# Patient Record
Sex: Male | Born: 1960 | Race: White | Hispanic: No | Marital: Married | Smoking: Former smoker
Health system: Southern US, Community
[De-identification: ages and names within clinical notes are randomized; demographics above are authoritative.]

## PROBLEM LIST (undated history)

## (undated) DIAGNOSIS — Z789 Other specified health status: Secondary | ICD-10-CM

## (undated) DIAGNOSIS — E785 Hyperlipidemia, unspecified: Secondary | ICD-10-CM

## (undated) DIAGNOSIS — I2510 Atherosclerotic heart disease of native coronary artery without angina pectoris: Secondary | ICD-10-CM

## (undated) DIAGNOSIS — G4730 Sleep apnea, unspecified: Secondary | ICD-10-CM

## (undated) DIAGNOSIS — M1990 Unspecified osteoarthritis, unspecified site: Secondary | ICD-10-CM

## (undated) DIAGNOSIS — G459 Transient cerebral ischemic attack, unspecified: Secondary | ICD-10-CM

## (undated) DIAGNOSIS — R079 Chest pain, unspecified: Secondary | ICD-10-CM

## (undated) HISTORY — DX: Atherosclerotic heart disease of native coronary artery without angina pectoris: I25.10

## (undated) HISTORY — PX: NASAL SINUS SURGERY: SHX719

## (undated) HISTORY — PX: PTCA: SHX146

## (undated) HISTORY — DX: Sleep apnea, unspecified: G47.30

## (undated) HISTORY — DX: Chest pain, unspecified: R07.9

## (undated) HISTORY — DX: Other specified health status: Z78.9

## (undated) HISTORY — DX: Transient cerebral ischemic attack, unspecified: G45.9

## (undated) HISTORY — DX: Hyperlipidemia, unspecified: E78.5

---

## 2000-12-09 ENCOUNTER — Encounter: Admission: RE | Admit: 2000-12-09 | Discharge: 2000-12-09 | Payer: Self-pay | Admitting: Orthopedic Surgery

## 2000-12-09 ENCOUNTER — Encounter: Payer: Self-pay | Admitting: Orthopedic Surgery

## 2002-04-19 ENCOUNTER — Emergency Department (HOSPITAL_COMMUNITY): Admission: EM | Admit: 2002-04-19 | Discharge: 2002-04-19 | Payer: Self-pay

## 2002-04-21 ENCOUNTER — Encounter: Payer: Self-pay | Admitting: Emergency Medicine

## 2002-04-21 ENCOUNTER — Inpatient Hospital Stay (HOSPITAL_COMMUNITY): Admission: EM | Admit: 2002-04-21 | Discharge: 2002-04-23 | Payer: Self-pay | Admitting: Emergency Medicine

## 2002-04-27 ENCOUNTER — Ambulatory Visit (HOSPITAL_COMMUNITY): Admission: RE | Admit: 2002-04-27 | Discharge: 2002-04-28 | Payer: Self-pay | Admitting: Cardiovascular Disease

## 2002-12-14 ENCOUNTER — Ambulatory Visit (HOSPITAL_COMMUNITY): Admission: RE | Admit: 2002-12-14 | Discharge: 2002-12-15 | Payer: Self-pay | Admitting: Cardiovascular Disease

## 2007-01-12 ENCOUNTER — Ambulatory Visit (HOSPITAL_COMMUNITY): Admission: RE | Admit: 2007-01-12 | Discharge: 2007-01-12 | Payer: Self-pay | Admitting: *Deleted

## 2008-08-31 ENCOUNTER — Emergency Department: Payer: Self-pay | Admitting: Emergency Medicine

## 2009-06-09 ENCOUNTER — Encounter: Payer: Self-pay | Admitting: Cardiovascular Disease

## 2010-05-13 ENCOUNTER — Encounter: Payer: Self-pay | Admitting: *Deleted

## 2010-09-07 NOTE — H&P (Signed)
NAMEMEHMET, SCALLY                          ACCOUNT NO.:  [id number]   MEDICAL RECORD NO.:  [id number]                   PATIENT TYPE:  INP   LOCATION:  1827                                 FACILITY:  [facility]   PHYSICIAN:  Mark A. Perini, M.D.                DATE OF BIRTH:  22-Sep-1960   DATE OF ADMISSION:  04/21/2002  DATE OF DISCHARGE:                                HISTORY & PHYSICAL   CHIEF COMPLAINT:  Chest and neck pain.   HISTORY OF PRESENT ILLNESS:  The patient is a pleasant 50 year-old male with  no previous cardiac history but who does have a past history of significant  tobacco use and a family history of early coronary artery disease in his  mother.  He presents with a one week history of on and off pain at rest  which develops in his upper chest and extends into his bilateral neck.  These episodes initially started as just a brief shock like feeling of pain  that would just last a few seconds, but over the last few days the length,  severity and frequency of these episodes has increased.  Now, the pain  episodes last a few minutes at least.  He was seen in the emergency room on  April 19, 2002 for similar symptoms and had a normal EKG and enzymes and  was released.  However, the pains have continued.  This morning he was  awakened at 3:00 in the morning and had significant pain for several  minutes.  Again, this morning he had an episode of pain which extended into  his neck and down both arms and he also had associated diaphoresis at that  time.  In the emergency room the EKG is normal as is the chest x-ray.  Initial cardiac enzymes are normal, but the patient will require admission  for further evaluation.   PAST MEDICAL HISTORY:  1. Significant gastroesophageal reflux for the last two years.  He has been     on Nexium for the last six to eight months which did help with dyspepsia     that he was having earlier.  2. Borderline elevated blood pressure in the  office on the last visit, but     no definite history of hypertension.  3. He has an unknown cholesterol history.  4. History of nose surgery and eye surgery.  5. No  known cardiovascular history and no history of diabetes.   ALLERGIES:  No known allergies.   MEDICATIONS:  Nexium 40 mg once daily.   SOCIAL HISTORY:  He has been married for five years.  He has three children,  one from his current marriage and two step-children.  He quit smoking  tobacco in February of 2003.  He still dips [place].  He did smoke two to  three packs a day for 20 years.  He drinks four to five  alcoholic drinks per  day on average. No illicit drug use history.  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   FAMILY HISTORY:  Mother is alive at age 20.  She was diagnosed with heart  disease in her mid fifties.  Father is alive at age 26 and has no history of  vascular disease.  He has four sisters, one with a thyroid disorder, but no  known cardiovascular disease in his sisters.   REVIEW OF SYMPTOMS:  As per the history of present illness.  The patient  denies any fevers, chills, diarrhea or constipation. He denies any blood in  his stool or melena.  He denies any hematemesis or hematochezia or  hemoptysis. He has had no cough or genitourinary symptoms.  He states that  he has had eight or nine episodes of the pain in the last twelve hours.  He  has had no pain since coming to the emergency room.   PHYSICAL EXAMINATION:  VITAL SIGNS:  Temperature is 97.0, blood pressure  152/80, pulse 68, respiratory rate 18, 98% saturation on room air.  GENERAL:  He is in no acute distress, sitting on a stretcher.  HEENT:  Normocephalic, atraumatic.  Pupils equal, round and reactive to  light.  Extraocular movements are intact. There is no jugular venous  distention.  No carotid or other bruits are noted.  LUNGS:  Clear to auscultation bilaterally.  HEART:  Regular rate and rhythm with no murmurs,  rubs or gallops.  ABDOMEN: Benign.  EXTREMITIES:  There is no cyanosis, clubbing or edema.  There are 2+ distal  and femoral pulses bilaterally.   LABORATORY DATA:  Sodium 139, potassium 4.1, chloride 106, C02 25, BUN 11,  creatinine 1.0, glucose 105.  LFTs are normal.  Lipase is normal at 23.  The  pH is 7.44, pC02 is 35 and bicarbonate is 24.  D-dimer is negative at less  than 0.22.  Troponin I is 0.01, CK is 93 with an MB of 0.9.  CBC today was  hemolyzed but a CBC from April 19, 2002 is normal.   EKG reveals normal sinus rhythm with a heart rate of 67 beats per minute and  no acute ST or T wave changes.   Chest x-ray shows no acute disease.   ASSESSMENT AND PLAN:  50 year-old male with rest chest pain in the  setting of a family history of coronary artery disease and past heavy  tobacco use.  We will admit him to a telemetry bed.  We will rule out for  myocardial infarction with serial enzymes and electrocardiograms.  We will  increase his protein pump inhibitor to twice daily. We will give him an  aspirin dose and treat him with oxygen and IV access.  We will give him low  dose nitroglycerin paste for the first 12 hours of his admission. We will  call cardiology for a consultation to assist in his workup.  Some features  of his pain are atypical for unstable angina, but coronary artery disease  needs to be considered as a possible cause of his pain. He does have a  significant reflux history and this could be a reflux variant or  musculoskeletal type pain.  Mark A. Waynard Edwards, M.D.    MAP/MEDQ  D:  04/21/2002  T:  04/21/2002  Job:  [id number]   cc:   Barry Dienes. Eloise Harman, M.D.  [address]  [place]  [place] [address]  Fax: [phone]

## 2010-09-07 NOTE — Consult Note (Signed)
Mario [name], Mario [name]                          ACCOUNT NO.:  [id number]   MEDICAL RECORD NO.:  [id number]                   PATIENT TYPE:  INP   LOCATION:  2009                                 FACILITY:  [facility]   PHYSICIAN:  Vesta Mixer, M.D.              DATE OF BIRTH:  [name] 08, 1961   DATE OF CONSULTATION:  04/21/2002  DATE OF DISCHARGE:                                   CONSULTATION   HISTORY OF PRESENT ILLNESS:  The patient is a 50 year old gentleman with a  history of gastroesophageal reflux.  He was admitted with chest pain and  diaphoresis and arm pain.   The patient reports feeling fatigued for the past month or so.  His wife has  also noticed that he really has a lack of energy for the past several  months.  For the past week or so he has developed episodes of chest pain and  pressure.  The pain is described as a cold or burning-like sensation.  Last  week it was associated with exertion.  This week it was not associated with  any particular activity or moving around.  He states that these episodes of  pain last for several minutes and the pain radiates up to his neck and down  his arms.  The pain was quite severe and woke him up this morning.  He  presented to the emergency room for further evaluation.   CURRENT MEDICATION:  Nexium once a day.   ALLERGIES:  No known drug allergies.   PAST MEDICAL HISTORY:  Gastroesophageal reflux disease.   SOCIAL HISTORY:  The patient quit smoking this year.  He drinks several  beers a day.   FAMILY HISTORY:  This is positive for coronary artery disease.   REVIEW OF SYSTEMS:  This is positive for fatigue.  Otherwise his review of  systems is negative.   PHYSICAL EXAMINATION:  GENERAL APPEARANCE:  He is a young gentleman in no  acute distress.  He is currently pain-free.  VITAL SIGNS:  His heart rate is 60, blood pressure is 112/59.  HEENT:  There are 2+ carotids, no JVD, no bruits, no thyromegaly.  LUNGS:  Clear to  auscultation.  CARDIOVASCULAR:  Regular rate, S1 and S2 with no murmurs, rubs, or gallops.  ABDOMEN:  Good bowel sounds.  No hepatosplenomegaly.  EXTREMITIES:  There is no clubbing, cyanosis, or edema.  NEUROLOGIC:  Examination is nonfocal.   His EKG reveals normal sinus rhythm.  He has no ST or T-wave abnormalities.   LABORATORY DATA:  Normal CBC and normal chem-7.  His CPK and troponin  levels are negative.  D-dimer level is negative.   IMPRESSION:  The patient presents with symptoms that are fairly classic for  unstable angina.  He initially had exertional angina and now has angina at  rest.  These episodes of chest pain last for several minutes and are  associated  with a pressure-like sensation with radiation down his arms and  up to his jaw.  Given these unstable  symptoms, I do not think that a Cardiolite would be advisable.  I have  recommended that we proceed with a heart catheterization next Friday.  If he  develops positive cardiac enzymes, then we will start him on heparin.  His  other medical problems remain stable.  We will check a fasting lipid profile  tomorrow morning.                                                Vesta Mixer, M.D.    PJN/MEDQ  D:  04/21/2002  T:  04/22/2002  Job:  [id number]

## 2010-09-07 NOTE — H&P (Signed)
NAMEMURPHY, DUZAN NO.:  [id number]   MEDICAL RECORD NO.:  [id number]                   PATIENT TYPE:   LOCATION:                                       FACILITY:   PHYSICIAN:  Vesta Mixer, M.D.              DATE OF BIRTH:  05-23-1960   DATE OF ADMISSION:  04/27/2002  DATE OF DISCHARGE:                                HISTORY & PHYSICAL   HISTORY OF PRESENT ILLNESS:  The patient is a 50 year old gentleman with a  history of gastroesophageal reflux disease, hyperlipidemia and chest pain.  He was admitted last week for episodes of chest pain.  He ruled out for  myocardial infarction but had a heart catheterization which revealed a  moderately tight left circumflex lesion.  He was sent home to be placed on  medical therapy but now returns for heart catheterization because of  persistent episodes of chest pain.   The patient has had persistent episodes of chest pain.  These episodes of  chest pain occur primarily with rest.  They typically occur in the night or  early in the morning.  They are described as a chest pressure which radiates  up into his neck and down into his arms.  It is associated with some  burning.  It does not feel anything at all like his episodes of  gastroesophageal reflux and he is referred for heart catheterization for  further evaluation.   CURRENT MEDICATIONS:  1. Enteric-coated aspirin once a day.  2. Plavix 75 mg a day.  3. Imdur 30 mg a day.   ALLERGIES:  He has no known drug allergies.   PAST MEDICAL HISTORY:  1. Gastroesophageal reflux.  2. Coronary artery disease.  3. Hyperlipidemia.   SOCIAL HISTORY:  The patient used to smoke but quit approximately one year  ago.  He does not drink a significant amount of alcohol.   FAMILY HISTORY:  His family history is pertinent for coronary artery  disease.  His mother does have a history of syncope with near cardiogenic  syncope.   REVIEW OF SYSTEMS:  His review  of systems is negative.   PHYSICAL EXAMINATION:  GENERAL:  On exam, he is a middle-aged gentleman in  no acute distress.  He is alert and oriented x3 and his mood and affect are  normal.  VITAL SIGNS:  His weight is 216 and his blood pressure is 124/80 with a  heart rate of 76.  HEENT/NECK:  Exam reveals 2+ carotids.  He has no bruits.  There is no JVD  and no thyromegaly.  LUNGS:  Lungs are clear to auscultation.  HEART:  Regular rate, S1 and S2, with no murmurs, gallops or rubs.  ABDOMEN:  Exam reveals good bowel sounds and is nontender.  EXTREMITIES:  He has no cyanosis, clubbing or edema.  NEUROLOGIC:  Exam is nonfocal.   LABORATORY AND ACCESSORY  CLINICAL DATA:  His EKG reveals normal sinus  rhythm.  He has no ST or T wave changes.   IMPRESSION:  The patient presents with persistent episodes of chest pain  consistent with unstable angina.  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  I had hoped that we could treat  him medically but he continues to have episode of pain.  Given his rather  unstable symptoms, I do not think that a Cardiolite is advisable.  We will  schedule him for a heart catheterization.  We have discussed the risks,  benefits and options of heart catheterization and angioplasty; he  understands and agrees to proceed.                                               Vesta Mixer, M.D.    PJN/MEDQ  D:  04/27/2002  T:  04/27/2002  Job:  [id number]   cc:   Barry Dienes. Eloise Harman, M.D.  [address]  [place]  [place] [address]  Fax: [phone]

## 2010-09-07 NOTE — Discharge Summary (Signed)
NAMEWILKIE, ZENON                          ACCOUNT NO.:  [id number]   MEDICAL RECORD NO.:  [id number]                   PATIENT TYPE:  INP   LOCATION:  2009                                 FACILITY:  [facility]   PHYSICIAN:  Vesta Mixer, M.D.              DATE OF BIRTH:  1960-07-17   DATE OF ADMISSION:  04/21/2002  DATE OF DISCHARGE:  04/23/2002                                 DISCHARGE SUMMARY   DISCHARGE DIAGNOSES:  1. Chest pain - probably noncardiac.  2. Mild to moderate coronary artery irregularities.  3. Hyperlipidemia.  4. History of gastroesophageal reflux disease.   DISCHARGE MEDICATIONS:  1. Enteric-coated aspirin 325 mg a day.  2. Plavix 75 mg a day.  3. Toprol XL 25 mg a day.  4. IMDUR 60 mg a day.  5. Nitroglycerin 0.4 mg sublingually as needed.  6. Crestor 10 mg at night.  7. Prevacid 30 mg p.o. b.i.d.   DISPOSITION:  The patient is to see Dr. Elease Hashimoto next week for a Cardiolite  study.  He is to call for an appointment.   HISTORY OF PRESENT ILLNESS:  The patient is a 50 year old gentleman who was  admitted to the hospital on December 31, with episodes of chest pain.  His  chest pain was somewhat typical for angina.   HOSPITAL COURSE:  Problem 1.  Chest pain. The patient ruled out for  myocardial infarction with serial CPKs and EKGs.  On April 23, 2002, he had  a cardiac catheterization which revealed a moderate stenosis in his  circumflex marginal stenosis.  This stenosis appeared to be approximately 30  to 40% in severity in some views and as much as 50 to 60% in some other  angles.  It did not appear to obstruct flow. It also did not appear to be  severe enough to be causing rest angina.   The patient was started on medications and tolerated them fairly well.  He  tolerated his nitroglycerin patch. He will be discharged on the above-noted  medications. We will schedule a Cardiolite study as an outpatient.  I have  asked him to call sooner if he has  any worsening problems.   Problem 2.  Hypercholesterolemia.  The patient's fasting lipid profile  revealed an LDL of 128.  He was started on Crestor 10 mg a day.   Problem 3.  Gastroesophageal reflux disease.  The patient had been on  Nexium. We will place him on Prevacid 30 mg twice a day which should help  with some of his symptoms.  He is to call back for further problems.                                               Vesta Mixer, M.D.  PJN/MEDQ  D:  04/23/2002  T:  04/24/2002  Job:  [id number]   cc:   Barry Dienes. Eloise Harman, M.D.  [address]  [place]  [place] [address]  Fax: [phone]

## 2010-09-07 NOTE — Cardiovascular Report (Signed)
NAMEICARUS, PARTCH                          ACCOUNT NO.:  [id number]   MEDICAL RECORD NO.:  [id number]                   PATIENT TYPE:  OIB   LOCATION:  6532                                 FACILITY:  [facility]   PHYSICIAN:  Vesta Mixer, M.D.              DATE OF BIRTH:  [birth year]   DATE OF PROCEDURE:  12/14/2002  DATE OF DISCHARGE:                              CARDIAC CATHETERIZATION   PROCEDURE:  Left heart catheterization with coronary angiography with PTCA  and stenting of the left circumflex marginal vessel.   INDICATIONS:  Mr. Milby is a 50 year old gentleman with a history of  coronary artery disease.  He has had PTCA and stenting to his left  circumflex marginal vessel back in January of 2004.  He returned yesterday  with episodes of chest pain and shortness of breath, particularly with  exertion.  He is referred for a heart catheterization for further  evaluation.   PROCEDURE:  The right femoral artery was easily cannulated using a modified  Seldinger technique.   HEMODYNAMICS:  The LV pressure was 143/20 with an aortic pressure of 144/80.   ANGIOGRAPHY:  The left main is very short and is otherwise normal.   The left anterior descending artery is a moderate sized vessel.  There are  minor luminal irregularities in the LAD.  There are no discreet stenoses.  There are several diagonal branches which are normal.   The left circumflex artery is a large and dominant vessel.  There is a  stenosis at the bifurcation of the first obtuse marginal artery and the  distal circumflex artery.  This stenosis is approximately 75% in each limb.  The previously placed stent in the first obtuse marginal artery has moderate  diffuse in-stent restenosis.  There is TIMI grade 3 flow down both vessels.   The right coronary artery is small and nondominant.  There was catheter  dampening with engagement.   LEFT VENTRICULOGRAM:  The left ventriculogram was performed in a 30 RAO  position.  It reveals overall normal left ventricular systolic function.  The ejection fraction is approximately 60%.  There is some catheter induced  mitral regurgitation.   PTCA:  The left main was engaged using a 6-French Judkins left 4 guide.  The  patient was given heparin at a weight based amount followed by a double  bolus Integrilin drip.  The circumflex marginal vessel was wired using a  Patriot guidewire.  A 3.5 x 15 mm Quantum Maverick was positioned down  across the stenosis and was inflated up to 8 atmospheres for 44 seconds.  This resulted in a very nice angiographic view with significant improvement  of the lesion.  At this point a 3.5 x 16 mm Taxus stent was positioned  across the stenosis.  We positioned the stent to extend up to cover the  entrance of the distal circumflex.  It was deployed at 25  atmospheres for 34  seconds.  This resulted in a very nice angiographic result of the obtuse  marginal artery.  There was some compromise of the true circumflex.  The  stent balloon was pulled out and the 3.5 x 15 mm Quantum Maverick was placed  down into the distal aspect of the previously placed stent.  It was deflated  up to 18 atmospheres for 41 seconds and then pulled back and was inflated up  to 18 atmospheres for another 20 seconds.  This resulted in further  improvement of the origin stent and the placed stent.  The wire was then  pulled out of the obtuse marginal artery and was repositioned down the true  circumflex.  The 3.5 x 15 mm Quantum would not pass because of the wing of  the balloon.  At this point a 3.0 x 15 mm Maverick balloon was able to slide  down the true circumflex.  It was inflated up to 8 atmospheres for 32  seconds which resulted in significant improvement of the vessel opening.  There was perhaps a 10-20% residual stenosis.   COMPLICATIONS:  None.   CONCLUSION:  1. Successful PTCA and stenting of the first obtuse marginal artery and the     distal  circumflex artery.  2. Normal left ventricular systolic function.                                               Vesta Mixer, M.D.    PJN/MEDQ  D:  12/14/2002  T:  12/15/2002  Job:  [id number]

## 2010-09-07 NOTE — Cardiovascular Report (Signed)
NAMETYE, VIGO                          ACCOUNT NO.:  [id number]   MEDICAL RECORD NO.:  [id number]                   PATIENT TYPE:  OIB   LOCATION:  2875                                 FACILITY:  [facility]   PHYSICIAN:  Vesta Mixer, M.D.              DATE OF BIRTH:  04/03/1961   DATE OF PROCEDURE:  04/27/2002  DATE OF DISCHARGE:                              CARDIAC CATHETERIZATION   INDICATIONS:  The patient is a 50 year old gentleman who was admitted to the  hospital with chest pains last week.  He ruled out for myocardial  infarction.  He had a heart catheterization which revealed a moderate to  severe left circumflex marginal stenosis. It was felt that this stenosis  could be treated medically and he was discharged to home.  He continued to  have episodes of chest pain.  He was brought back today for intervention of  his obtuse marginal artery.   DESCRIPTION OF PROCEDURE:  The right femoral artery was easily cannulated  using the modified Seldinger technique.  An 8 French catheter was placed.  Initial angiography revealed a moderate left circumflex marginal stenosis.  We gave the patient 5000 units of heparin.  We performed a intravascular  ultrasound.  This revealed a very tight stenosis which was actually much  more severe than what we initially thought.  The stenosis was approximately  85-90% in severity.  In addition it revealed a very long segment of plaque  extending from the bifurcation of the left circumflex and marginal system  down approximately 22 mm.  The patient was given a double bolus Integrilin  drip.  Distally a 3.5 x 24 mm Express II stent was positioned right at the  bifurcation extending down the obtuse marginal artery.  It was deployed at  14 atmospheres for 60 seconds.  A followup IVUS revealed that there was some  slight under deployment of the stent, especially at the tight lesion.  In  addition, it revealed that the reference vessel was  approximately 3.5 to  3.75 mm in diameter and that the stent had only been expanded to 3.1 mm.  At  this point a 3.1 x 15 mm Quantum Maverick was positioned down into the  stent.  It was deployed distally at 12 atmospheres for 20 seconds and then  was deployed proximally at 14 atmospheres for 24 seconds.  This resulted in  a very nice angiographic result of the first obtuse marginal.  There was  some compromise of the true circumflex artery.  At this point the IVUS and  wire were removed and a second Patriot wire was placed down the circumflex  vessel.  A 3.0 x 15 mm Maverick balloon was positioned across the stenosis.  It was inflated up to 2 atmospheres for 17 seconds, 6 atmospheres for 50  seconds and then 12 atmospheres for 40 seconds.  This resulted in a very  nice angiographic result.  Followup IVUS of this lesion revealed minor  plaque irregularities.  The tightest stenosis was approximately 25%.  There  was no plaque disruption and no dissection.  The stent struts were wall  opposed and the stent ended right at the bifurcation of the circumflex  artery and obtuse marginal artery.   The patient did have some postprocedural pain, but there was brisk flow  throughout the case.  The patient will be treated with Plavix for  approximately nine months.                                                  Vesta Mixer, M.D.    PJN/MEDQ  D:  04/27/2002  T:  04/27/2002  Job:  [id number]

## 2010-09-07 NOTE — Discharge Summary (Signed)
   Mario [name], Mario [name]                          ACCOUNT NO.:  [id number]   MEDICAL RECORD NO.:  [id number]                   PATIENT TYPE:  OIB   LOCATION:  6532                                 FACILITY:  [facility]   PHYSICIAN:  Vesta Mixer, M.D.              DATE OF BIRTH:  August 15, 1960   DATE OF ADMISSION:  12/14/2002  DATE OF DISCHARGE:  12/15/2002                                 DISCHARGE SUMMARY   DISCHARGE DIAGNOSES:  1. Coronary artery disease.  2. Status post percutaneous transluminal coronary angioplasty and stenting     of first obtuse marginal artery.  3. Dyslipidemia - intolerant to most statins.   DISCHARGE MEDICATIONS:  1. Aspirin 325 mg a day.  2. Plavix 75 mg a day.  3. Nexium 40 mg a day.  4. Nitroglycerin 0.4 mg sublingually.  5. Pravachol 40 mg a day.  6. Zetia 10 mg a day.   DISPOSITION:  1. The patient will see Dr. Elease Hashimoto in 1-2 weeks.  2. He has been advised not to lift anything heavy for the next couple of     weeks.  3. We have asked him to increase his aerobic exercise and to limit his     weight-lifting activities.  4. We will consider starting him on Niaspan and/or Welchol in the near     future.  He has been intolerant to Zocor, Lipitor and Crestor.   HISTORY:  Mr. Rodda is a 50 year old gentleman with a history of coronary  artery disease, status post PTCA and stenting of his first obtuse marginal  artery back in January 2004.  He was admitted with episodes of chest pain.  Please see dictated H&P for further details.   HOSPITAL COURSE:  #1.  CHEST PAIN:  The patient had a heart catheterization,  which revealed a tight stenosis in his circumflex marginal vessel.  He  underwent successful percutaneous transluminal coronary angioplasty and  stenting of this vessel, using a 3.5 x 16-mm TAXUS stent.  The stent was  inflated up to 3.7 mm in diameter.  We  also then performed balloon angioplasty down the negative circumflex artery.  He tolerated the  procedure quite well.  He did not have any episodes of  chest pain or problems.   He will be discharged on the above-noted medications and dispositioned.                                                Vesta Mixer, M.D.    PJN/MEDQ  D:  12/15/2002  T:  12/16/2002  Job:  [id number]   cc:   Barry Dienes. Eloise Harman, M.D.  [address]  [place]  [place] [address]  Fax: [phone]

## 2010-09-07 NOTE — Cardiovascular Report (Signed)
NAMEANDRAY, Mario [name]                          ACCOUNT NO.:  [id number]   MEDICAL RECORD NO.:  [id number]                   PATIENT TYPE:  INP   LOCATION:  2009                                 FACILITY:  [facility]   PHYSICIAN:  Vesta Mixer, M.D.              DATE OF BIRTH:  08-03-1960   DATE OF PROCEDURE:  04/23/2002  DATE OF DISCHARGE:                              CARDIAC CATHETERIZATION   HISTORY:  The patient is a 50 year old gentleman who was admitted to the  hospital two days ago with symptoms consistent with unstable angina.  He was  referred for a heart catheterization because of the nature of his symptoms.   PROCEDURE:  Left heart catheterization with coronary angiography.   DESCRIPTION OF PROCEDURE:  The right femoral artery was easily cannulated  using a modified Seldinger technique.   HEMODYNAMICS:  The left ventricular pressure was 119/16 with an aortic  pressure of 121/96.   ANGIOGRAPHY:  1. His left main coronary artery was smooth and normal.  2. The left anterior descending artery is fairly normal.  There are several     large diagonal branches which are fairly normal.  3. The left circumflex artery is large and dominant.  It gives off a large     first obtuse marginal artery.  There is a 40%-50% to 50%-60% stenosis in     the proximal aspect of this first OM.  In some views, this lesions     appears to be in the 60%-70% range and in other views this lesions     appears to be only 30%-40% in severity.  The flow down this vessel is     quite brisk.  4. The distal circumflex artery and posterolateral branches and posterior     descending artery are normal.  5. The right coronary artery is relatively small and is nondominant.  There     are no significant irregularities.  6. The left ventriculogram was performed in a 30 RAO position.  It reveals     overall normal left ventricular systolic function.  There are no     segmental wall motion abnormalities.   COMPLICATIONS:  None.   CONCLUSION:  The patient has single-vessel coronary artery disease involving  his first obtuse marginal artery.  This stenosis is moderate in severity.  The flow through this lesion is fairly good.  This lesion appears to be  tight in  some views and in other views it appears to be not so tight.  I would like  to treat him medically and we will schedule a stress Cardiolite study as an  outpatient.  If he is found to have ischemia or if he has recurrent  symptoms, then we will proceed with angioplasty and stenting of this lesion.  Vesta Mixer, M.D.    PJN/MEDQ  D:  04/23/2002  T:  04/23/2002  Job:  [id number]   cc:   Barry Dienes. Eloise Harman, M.D.  [address]  [place]  [place] [address]  Fax: [phone]

## 2010-09-07 NOTE — Discharge Summary (Signed)
[name], [name]                          ACCOUNT NO.:  [id number]   MEDICAL RECORD NO.:  [id number]                   PATIENT TYPE:  OIB   LOCATION:  6533                                 FACILITY:  [facility]   PHYSICIAN:  Vesta Mixer, M.D.              DATE OF BIRTH:  1961/01/23   DATE OF ADMISSION:  04/27/2002  DATE OF DISCHARGE:  04/28/2002                                 DISCHARGE SUMMARY   DISCHARGE DIAGNOSES:  1. Coronary artery disease involving the left circumflex artery.  2. Status post percutaneous transluminal coronary angioplasty and stenting     of the first obtuse marginal artery, status post percutaneous     transluminal coronary angioplasty of the left circumflex artery.  3. Hyperlipidemia.  4. History of cigarette smoking.   DISCHARGE MEDICATIONS:  1. Enteric-coated aspirin 325 mg a day.  2. Plavix 75 mg a day.  3. Nitroglycerin 0.4 mg sublingually as needed.  4. Crestor 10 mg a day.  5. Nexium 40 mg a day.   DISPOSITION:  The patient is to eat a low-fat/low-salt/low-cholesterol diet.  He is to walk every day.  He is to see Dr. Elease Hashimoto in 1-2 weeks.   HISTORY:  The patient is a 50 year old gentleman with a history of chest  pain.  He was admitted to the hospital last week for episodes of chest pain.  He ruled out for myocardial infarction and had no EKG changes.  Heart  catheterization revealed what was thought to be a moderate left circumflex  stenosis and he was discharged on medical therapy.  He continued to have  episodes of chest pain and was readmitted for a heart catheterization.   HOSPITAL COURSE:  Coronary artery disease:  The patient underwent a repeat  heart catheterization.  This time, we performed an intravascular ultrasound  and found the lesion to be quite tight.  The marginal stenosis was between  85%-90% by ultrasound.  We performed PTCA and stenting of the first obtuse  marginal artery using a 3.5 x 24 mm Express 2 stent.  The stent  was  postdilated with a 3.75 mm balloon with adequate stent deployment.  After  the stent deployment, there was some compromise of the distal circumflex  artery just after the bifurcation.  This was treated with a balloon  angioplasty with satisfactory results.  The intravascular ultrasound  revealed a significant amount of  diffuse plaque up and down the left circumflex artery.  Because of this, we  did not place one of the drug-eluting stents because of the inability to  extend the stent to a normal segment.  The patient will be treated with  aggressive cholesterol management.  We will see him back in the office in  several weeks.  Vesta Mixer, M.D.    PJN/MEDQ  D:  04/28/2002  T:  04/28/2002  Job:  [id number]   cc:   Barry Dienes. Eloise Harman, M.D.  [address]  [place]  [place] [address]  Fax: [phone]

## 2010-09-07 NOTE — H&P (Signed)
NAMEBUTCH, OTTERSON                         ACCOUNT NO.:  [id number]   MEDICAL RECORD NO.:  [id number]                   PATIENT TYPE:  OIB   LOCATION:                                       FACILITY:  [facility]   PHYSICIAN:  Vesta Mixer, M.D.              DATE OF BIRTH:  [birth year]   DATE OF ADMISSION:  12/14/2002  DATE OF DISCHARGE:                                HISTORY & PHYSICAL   REASON FOR ADMISSION:  Mario [name] is a young gentleman with a history of  coronary artery disease.  He is status post PTCA and stenting of his first  obtuse marginal artery and status post PTCA of his circumflex artery back in  January 2004.  He has done fairly well, but starting having some episodes of  chest pain several days ago.  All of these episodes of chest pain feel very  similar to his previous episodes of angina.  They typically occur with  exertion and are relieved with rest.  He has not tried a sublingual  nitroglycerin.   The patient has been doing his normal activities, but gradually has been  slowing down because of these episodes of chest pain.  He states that the  pain is not nearly as severe, and does not last as long.   CURRENT MEDICATIONS:  1. Aspirin 325 mg q.d.  2. Plavix 75 mg q.d.  3. Nexium 40 mg q.d.   ALLERGIES:  INTOLERANT to CRESTOR, ZOCOR and LIPITOR, all of which cause  muscle pain.   PAST MEDICAL HISTORY:  1. Hyperlipidemia.  2. Coronary artery disease.  3. Gastroesophageal reflux disease.   SOCIAL HISTORY:  The patient does not smoke.  He quit smoking earlier last  year.  He drinks several beers a day.   FAMILY HISTORY:  Positive for coronary artery disease.   REVIEW OF SYSTEMS:  Positive for fatigue and these chest pains as noted  above.  He has no problems with his eyes, ears, nose, or throat.  He denies  any syncope or presyncope, orthopnea or PND.  He denies any cough or sputum  production.  His review of systems is reviewed and is otherwise  negative.   PHYSICAL EXAMINATION:  VITAL SIGNS:  His weight 206, blood pressure 140/90,  heart rate of 67.  GENERAL:  He is a young gentleman in no acute distress.  He is alert and  oriented times three and his mood and affect are normal.  HEENT:  Reveals 2+ carotids.  He has no bruits, no JVD. No thyromegaly.  LUNGS:  Clear to auscultation.  HEART:  Regular rate, S1, S2, no murmurs, gallops, or rubs.  ABDOMEN:  Reveals good bowel sounds and is nontender.  EXTREMITIES:  He has no clubbing, cyanosis, or edema.  NEUROLOGIC:  Nonfocal.   LABORATORY AND ACCESSORY DATA:  His EKG revealed normal sinus rhythm.  He  has a  very nonspecific ST and T-wave abnormalities in the lateral leads.  His laboratory data is pending.   IMPRESSION/PLAN:  Mario [name] presents with symptoms of angina.  He states  that these are not as severe, but the pain feels very similar to his  previous episodes of angina.  At this point, I do not think that a stress  test will be of much benefit since his pain is so reliable and seems to be  exertional in nature.  In addition, he had percutaneous transluminal  coronary angioplasty and stenting of his obtuse marginal artery with  percutaneous transluminal coronary angioplasty of the distal circumflex  artery.  We have discussed the risks, benefits, and options of heart  catheterization.  He understands and agrees to proceed.                                                Vesta Mixer, M.D.    PJN/MEDQ  D:  12/13/2002  T:  12/13/2002  Job:  [id number]   cc:   Barry Dienes. Eloise Harman, M.D.  [address]  [place]  [place] [address]  Fax: [phone]

## 2011-01-31 LAB — B. BURGDORFI ANTIBODIES, CSF: Lyme Ab: 0.26 LIV

## 2011-01-31 LAB — CSF CELL COUNT WITH DIFFERENTIAL
RBC Count, CSF: 0
RBC Count, CSF: 1 — ABNORMAL HIGH
Tube #: 1
Tube #: 4
WBC, CSF: 4

## 2011-01-31 LAB — CSF CULTURE W GRAM STAIN
Culture: NO GROWTH
Gram Stain: NONE SEEN

## 2011-01-31 LAB — PROTEIN AND GLUCOSE, CSF: Glucose, CSF: 53

## 2011-01-31 LAB — HSV PCR
HSV 2 , PCR: NOT DETECTED
HSV, PCR: NOT DETECTED

## 2011-05-29 ENCOUNTER — Encounter: Payer: Self-pay | Admitting: Internal Medicine

## 2011-06-14 ENCOUNTER — Ambulatory Visit: Payer: Self-pay | Admitting: Internal Medicine

## 2011-07-30 ENCOUNTER — Encounter: Payer: Self-pay | Admitting: *Deleted

## 2011-11-15 DIAGNOSIS — M19029 Primary osteoarthritis, unspecified elbow: Secondary | ICD-10-CM | POA: Insufficient documentation

## 2011-11-15 DIAGNOSIS — M189 Osteoarthritis of first carpometacarpal joint, unspecified: Secondary | ICD-10-CM | POA: Insufficient documentation

## 2011-11-15 DIAGNOSIS — M19039 Primary osteoarthritis, unspecified wrist: Secondary | ICD-10-CM | POA: Insufficient documentation

## 2011-12-31 ENCOUNTER — Ambulatory Visit: Payer: Self-pay | Admitting: General Practice

## 2012-03-23 ENCOUNTER — Encounter: Payer: Self-pay | Admitting: Cardiovascular Disease

## 2012-04-27 ENCOUNTER — Encounter: Payer: Self-pay | Admitting: Cardiovascular Disease

## 2012-04-27 ENCOUNTER — Ambulatory Visit (INDEPENDENT_AMBULATORY_CARE_PROVIDER_SITE_OTHER): Payer: PRIVATE HEALTH INSURANCE | Admitting: Cardiovascular Disease

## 2012-04-27 VITALS — BP 155/95 | HR 74 | Ht 69.0 in | Wt 223.8 lb

## 2012-04-27 DIAGNOSIS — I2510 Atherosclerotic heart disease of native coronary artery without angina pectoris: Secondary | ICD-10-CM | POA: Insufficient documentation

## 2012-04-27 DIAGNOSIS — I10 Essential (primary) hypertension: Secondary | ICD-10-CM | POA: Insufficient documentation

## 2012-04-27 MED ORDER — CLOPIDOGREL BISULFATE 75 MG PO TABS: 75.0000 mg | ORAL_TABLET | Freq: Every day | ORAL | Status: DC

## 2012-04-27 NOTE — Patient Instructions (Addendum)
Your physician wants you to follow-up in: 1 year  You will receive a reminder letter in the mail two months in advance. If you don't receive a letter, please call our office to schedule the follow-up appointment.  Your physician recommends that you continue on your current medications as directed. Please refer to the Current Medication list given to you today.  REDUCE HIGH SODIUM FOODS LIKE CANNED SOUP, GRAVY, SAUCES, READY PREPARED FOODS LIKE FROZEN FOODS; LEAN CUISINE, LASAGNA. BACON, SAUSAGE, LUNCH MEAT, FAST FOODS.[name]  DASH Diet The DASH diet stands for "Dietary Approaches to Stop Hypertension." It is a healthy eating plan that has been shown to reduce high blood pressure (hypertension) in as little as 14 days, while also possibly providing other significant health benefits. These other health benefits include reducing the risk of breast cancer after menopause and reducing the risk of type 2 diabetes, heart disease, colon cancer, and stroke. Health benefits also include weight loss and slowing kidney failure in patients with chronic kidney disease.  DIET GUIDELINES  Limit salt (sodium). Your diet should contain less than 1500 mg of sodium daily.  Limit refined or processed carbohydrates. Your diet should include mostly whole grains. Desserts and added sugars should be used sparingly.  Include small amounts of heart-healthy fats. These types of fats include nuts, oils, and tub margarine. Limit saturated and trans fats. These fats have been shown to be harmful in the body. CHOOSING FOODS  The following food groups are based on a 2000 calorie diet. See your Registered Dietitian for individual calorie needs. Grains and Grain Products (6 to 8 servings daily)  Eat More Often: Whole-wheat bread, brown rice, whole-grain or wheat pasta, quinoa, popcorn without added fat or salt (air popped).  Eat Less Often: White bread, white pasta, white rice, cornbread. Vegetables (4 to 5 servings daily)  Eat More  Often: Fresh, frozen, and canned vegetables. Vegetables may be raw, steamed, roasted, or grilled with a minimal amount of fat.  Eat Less Often/Avoid: Creamed or fried vegetables. Vegetables in a cheese sauce. Fruit (4 to 5 servings daily)  Eat More Often: All fresh, canned (in natural juice), or frozen fruits. Dried fruits without added sugar. One hundred percent fruit juice ( cup [237 mL] daily).  Eat Less Often: Dried fruits with added sugar. Canned fruit in light or heavy syrup. [employer], Fish, and Poultry (2 servings or less daily. One serving is 3 to 4 oz [85-114 g]).  Eat More Often: Ninety percent or leaner ground beef, tenderloin, sirloin. Round cuts of beef, chicken breast, [place] breast. All fish. Grill, bake, or broil your meat. Nothing should be fried.  Eat Less Often/Avoid: Fatty cuts of meat, [place], or chicken leg, thigh, or wing. Fried cuts of meat or fish. Dairy (2 to 3 servings)  Eat More Often: Low-fat or fat-free milk, low-fat plain or light yogurt, reduced-fat or part-skim cheese.  Eat Less Often/Avoid: Milk (whole, 2%).Whole milk yogurt. Full-fat cheeses. Nuts, Seeds, and Legumes (4 to 5 servings per week)  Eat More Often: All without added salt.  Eat Less Often/Avoid: Salted nuts and seeds, canned beans with added salt. Fats and Sweets (limited)  Eat More Often: Vegetable oils, tub margarines without trans fats, sugar-free gelatin. Mayonnaise and salad dressings.  Eat Less Often/Avoid: Coconut oils, palm oils, butter, stick margarine, cream, half and half, cookies, candy, pie. FOR MORE INFORMATION The Dash Diet Eating Plan: www.dashdiet.org Document Released: 03/28/2011 Document Revised: 07/01/2011 Document Reviewed: 03/28/2011 [facility] Patient Information 2013 [place], [place].

## 2012-04-27 NOTE — Assessment & Plan Note (Signed)
Mario [name] is doing very well. He's not had any episodes of chest pain or shortness breath. He'll be getting his labs checked by his general medical Dr. in March.  He has been eating a little bit of extra fast food over the holidays and his blood pressure is up some. We'll give him information on the DASH diet.  I have advised him to exercise on regular basis. I'll see him again in one year for followup visit.

## 2012-04-27 NOTE — Progress Notes (Signed)
    Mario [name] Date of Birth  1960/09/14       [facility] [address]  [address] [place], [place]  [address]   [place], [place]  [address] [phone]     [phone]   Fax  [phone]    Fax [phone]  Problem List: 1.  Coronary artery disease-status post PTCA and stenting in 2004 2.  Hyperlipidemia 3. Hypertension  History of Present Illness:  Mario [name] is a 52 yo with hx of CAD.,  He's had an upper respiratory tract viral infection for the past month or so.  He denies any chest pain.  He has been able to do of his normal activities without any significant problems.  He works at the [employer] in [place].  Current Outpatient Prescriptions on File Prior to Visit  Medication Sig Dispense Refill  . aspirin 325 MG tablet Take 325 mg by mouth daily.      . benazepril (LOTENSIN) 20 MG tablet Take 20 mg by mouth daily.      . clopidogrel (PLAVIX) 75 MG tablet Take 75 mg by mouth daily.      [name] esomeprazole (NEXIUM) 40 MG capsule Take 40 mg by mouth daily before breakfast.      . ezetimibe (ZETIA) 10 MG tablet Take 10 mg by mouth daily.        Allergies  Allergen Reactions  . Crestor (Rosuvastatin Calcium)   . Lipitor (Atorvastatin Calcium)   . Zocor (Simvastatin)     Past Medical History  Diagnosis Date  . CAD (coronary artery disease)   . HLD (hyperlipidemia)   . Recurrent chest pain     Past Surgical History  Procedure Date  . Ptca     History  Smoking status  . Former Smoker  . Quit date: 04/22/2001  Smokeless tobacco  . Not on file    History  Alcohol Use  . 0.0 oz/week    Family History  Problem Relation Age of Onset  . Coronary artery disease      Reviw of Systems:  Reviewed in the HPI.  All other systems are negative.  Physical Exam: Blood pressure 155/95, pulse 74, height 5\' 9"  (1.753 m), weight 223 lb 12.8 oz (101.515 kg). General: Well developed, well  nourished, in no acute distress.  Head: Normocephalic, atraumatic, sclera non-icteric, mucus membranes are moist,   Neck: Supple. Carotids are 2 + without bruits. No JVD   Lungs: Clear   Heart: RR, normal S1, S2  Abdomen: Soft, non-tender, non-distended with normal bowel sounds.  Msk:  Strength and tone are normal   Extremities: No clubbing or cyanosis. No edema.  Distal pedal pulses are 2+ and equal    Neuro: CN II - XII intact.  Alert and oriented X 3.   Psych:  Normal   ECG: April 28, 2011 - NSR at 81,LAE.   Assessment / Plan:

## 2012-07-23 ENCOUNTER — Ambulatory Visit: Payer: Self-pay | Admitting: General Practice

## 2012-07-29 ENCOUNTER — Ambulatory Visit: Payer: Self-pay | Admitting: General Practice

## 2012-12-29 ENCOUNTER — Ambulatory Visit: Payer: Self-pay | Admitting: Unknown Physician Specialty

## 2013-01-22 ENCOUNTER — Ambulatory Visit: Payer: Self-pay | Admitting: Family Medicine

## 2013-02-04 ENCOUNTER — Ambulatory Visit: Payer: Self-pay | Admitting: Unknown Physician Specialty

## 2013-02-25 ENCOUNTER — Telehealth: Payer: Self-pay | Admitting: Cardiovascular Disease

## 2013-02-25 NOTE — Telephone Encounter (Signed)
New problem   Need to know if pt can hold aspirin 7day pre op and 3 day post op per Dr Willeen Cass.

## 2013-02-25 NOTE — Telephone Encounter (Signed)
Dr Elease Hashimoto wrote on clearance note that pt may hold plavix 7 days but would prefer he remain on asa. I informed Dr Talmage Nap assistant, she verbalized understanding.

## 2013-02-25 NOTE — Telephone Encounter (Signed)
Received request from Nurse fax box, documents faxed for surgical clearance. To: [facility]  Fax number: [phone] Attention: 02/25/13/KM

## 2013-03-10 ENCOUNTER — Ambulatory Visit: Payer: Self-pay | Admitting: Otolaryngology

## 2013-03-10 LAB — COMPREHENSIVE METABOLIC PANEL
Albumin: 4.1 g/dL (ref 3.4–5.0)
Alkaline Phosphatase: 86 U/L (ref 50–136)
Anion Gap: 6 — ABNORMAL LOW (ref 7–16)
Calcium, Total: 9 mg/dL (ref 8.5–10.1)
Osmolality: 280 (ref 275–301)
Potassium: 3.8 mmol/L (ref 3.5–5.1)
SGOT(AST): 27 U/L (ref 15–37)
SGPT (ALT): 41 U/L (ref 12–78)

## 2013-03-10 LAB — CBC WITH DIFFERENTIAL/PLATELET
Basophil #: 0 10*3/uL (ref 0.0–0.1)
Eosinophil #: 0.1 10*3/uL (ref 0.0–0.7)
HGB: 14.8 g/dL (ref 13.0–18.0)
Lymphocyte #: 1.5 10*3/uL (ref 1.0–3.6)
Lymphocyte %: 28.4 %
MCV: 91 fL (ref 80–100)
Monocyte #: 0.3 x10 3/mm (ref 0.2–1.0)
Neutrophil %: 64 %
Platelet: 196 10*3/uL (ref 150–440)
WBC: 5.2 10*3/uL (ref 3.8–10.6)

## 2013-03-24 ENCOUNTER — Ambulatory Visit: Payer: Self-pay | Admitting: Otolaryngology

## 2013-05-17 ENCOUNTER — Ambulatory Visit (INDEPENDENT_AMBULATORY_CARE_PROVIDER_SITE_OTHER): Payer: PRIVATE HEALTH INSURANCE | Admitting: Cardiovascular Disease

## 2013-05-17 ENCOUNTER — Encounter: Payer: Self-pay | Admitting: Cardiovascular Disease

## 2013-05-17 VITALS — BP 152/95 | HR 65 | Ht 68.0 in | Wt 221.2 lb

## 2013-05-17 DIAGNOSIS — I10 Essential (primary) hypertension: Secondary | ICD-10-CM

## 2013-05-17 DIAGNOSIS — I2510 Atherosclerotic heart disease of native coronary artery without angina pectoris: Secondary | ICD-10-CM

## 2013-05-17 MED ORDER — BENAZEPRIL HCL 40 MG PO TABS: 40.0000 mg | ORAL_TABLET | Freq: Every day | ORAL | Status: DC

## 2013-05-17 NOTE — Patient Instructions (Addendum)
Your physician has recommended you make the following change in your medication:  Increase Benazepril 40 mg daily    Limit salt  Follow up in 3 months with labs.

## 2013-05-17 NOTE — Progress Notes (Signed)
     Mario [name] Date of Birth  1960/05/28       [facility] [address], [place], [place]  [address]   [place], [place]  [address] [phone]     [phone]   Fax  [phone]    Fax [phone]  Problem List: 1.  Coronary artery disease-status post PTCA and stenting in 2004 2.  Hyperlipidemia 3. Hypertension  History of Present Illness:  Mario [name] is a 53 yo with hx of CAD.,  He's had an upper respiratory tract viral infection for the past month or so.  He denies any chest pain.  He has been able to do of his normal activities without any significant problems. He works at the [employer] in [place].  [name]. 26, 2015: Mario [name] is doing well.  No angina.   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   BP has been elevated .  He has been tried on HCTZ in the past but it made him feel poorly.      Current Outpatient Prescriptions on File Prior to Visit  Medication Sig Dispense Refill  . aspirin 325 MG tablet Take 325 mg by mouth daily.      . benazepril (LOTENSIN) 20 MG tablet Take 20 mg by mouth daily.      . clopidogrel (PLAVIX) 75 MG tablet Take 1 tablet (75 mg total) by mouth daily.  90 tablet  3  . esomeprazole (NEXIUM) 40 MG capsule Take 40 mg by mouth daily before breakfast.      . ezetimibe (ZETIA) 10 MG tablet Take 10 mg by mouth daily.       No current facility-administered medications on file prior to visit.    Allergies  Allergen Reactions  . Crestor [Rosuvastatin Calcium]   . Lipitor [Atorvastatin Calcium]   . Zocor [Simvastatin]     Past Medical History  Diagnosis Date  . CAD (coronary artery disease)   . HLD (hyperlipidemia)   . Recurrent chest pain   . Sleep apnea     cpap    Past Surgical History  Procedure Laterality Date  . Ptca    . Nasal sinus surgery      History  Smoking status  . Former Smoker  . Quit date: 04/22/2001    Smokeless tobacco  . Current User  . Types: Snuff    History  Alcohol Use  . 0.0 oz/week    Family History  Problem Relation Age of Onset  . Coronary artery disease      Reviw of Systems:  Reviewed in the HPI.  All other systems are negative.  Physical Exam: Blood pressure 152/95, pulse 65, height 5\' 8"  (1.727 m), weight 221 lb 4 oz (100.358 kg). General: Well developed, well nourished, in no acute distress. Head: Normocephalic, atraumatic, sclera non-icteric, mucus membranes are moist,  Neck: Supple. Carotids are 2 + without bruits. No JVD  Lungs: Clear  Heart: RR, normal S1, S2 Abdomen: Soft, non-tender, non-distended with normal bowel sounds. Msk:  Strength and tone are normal  Extremities: No clubbing or cyanosis. No edema.  Distal pedal pulses are 2+ and equal  Neuro: CN II - XII intact.  Alert and oriented X 3.  Psych:  Normal   ECG: May 17, 2013:  NSR .  TWI in lead III - slightly worsened since previous tracing  Assessment / Plan:

## 2013-05-17 NOTE — Assessment & Plan Note (Signed)
Mario [name] is feeling well but his BP remains elevated.  He has tried to avoid salt but still may be getting a bit of extra salt.  He has been tried on HCTZ but did not tolerate it.    We will increase his Benazepril to 40 mg a day.  Will have him exercise on a regular bais.   Will see him in 3 months with ov and BMP

## 2013-08-17 ENCOUNTER — Ambulatory Visit (INDEPENDENT_AMBULATORY_CARE_PROVIDER_SITE_OTHER): Payer: PRIVATE HEALTH INSURANCE | Admitting: Cardiovascular Disease

## 2013-08-17 ENCOUNTER — Encounter: Payer: Self-pay | Admitting: Cardiovascular Disease

## 2013-08-17 ENCOUNTER — Ambulatory Visit (INDEPENDENT_AMBULATORY_CARE_PROVIDER_SITE_OTHER): Payer: PRIVATE HEALTH INSURANCE

## 2013-08-17 VITALS — BP 140/92 | HR 69 | Ht 68.0 in | Wt 222.2 lb

## 2013-08-17 DIAGNOSIS — I10 Essential (primary) hypertension: Secondary | ICD-10-CM

## 2013-08-17 DIAGNOSIS — I2510 Atherosclerotic heart disease of native coronary artery without angina pectoris: Secondary | ICD-10-CM

## 2013-08-17 MED ORDER — POTASSIUM CHLORIDE ER 10 MEQ PO TBCR: 10.0000 meq | EXTENDED_RELEASE_TABLET | Freq: Every day | ORAL | Status: DC

## 2013-08-17 MED ORDER — HYDROCHLOROTHIAZIDE 25 MG PO TABS: ORAL_TABLET | ORAL | Status: DC

## 2013-08-17 MED ORDER — ASPIRIN EC 81 MG PO TBEC: 81.0000 mg | DELAYED_RELEASE_TABLET | Freq: Every day | ORAL | Status: DC

## 2013-08-17 NOTE — Patient Instructions (Signed)
Your physician has recommended you make the following change in your medication:  Decrease Aspirin to 81 mg daily  Start HCTZ 12.5 mg daily (1/2 of a 25 mg tablet) Start Potassium 10 meq daily   Your physician recommends that you return for lab work in:  BMP in 1 month   Your physician recommends that you schedule a follow-up appointment in:  3 months   Please have you most recent labs sent to [place]. Fax # [phone]

## 2013-08-17 NOTE — Progress Notes (Signed)
       Mario [name] Date of Birth  [birth year]       [facility] Office [address], [address], [place], [place]  [address]   [place], [place]  [address] [phone]     [phone]   Fax  [phone]    Fax [phone]  Problem List: 1.  Coronary artery disease-status post PTCA and stenting in 2004 2.  Hyperlipidemia 3. Hypertension  History of Present Illness:  Mario [name] is a 53 yo with hx of CAD.,  He's had an upper respiratory tract viral infection for the past month or so.  He denies any chest pain.  He has been able to do of his normal activities without any significant problems. He works at the [employer] in [place].  [name]. 26, 2015: Mario [name] is doing well.  No angina.   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   BP has been elevated .  He has been tried on HCTZ in the past but it made him feel poorly.    August 17, 2013:    Current Outpatient Prescriptions on File Prior to Visit  Medication Sig Dispense Refill  . aspirin 325 MG tablet Take 325 mg by mouth daily.      . clopidogrel (PLAVIX) 75 MG tablet Take 1 tablet (75 mg total) by mouth daily.  90 tablet  3  . esomeprazole (NEXIUM) 40 MG capsule Take 40 mg by mouth daily before breakfast.      . ezetimibe (ZETIA) 10 MG tablet Take 10 mg by mouth daily.       No current facility-administered medications on file prior to visit.    Allergies  Allergen Reactions  . Crestor [Rosuvastatin Calcium]   . Lipitor [Atorvastatin Calcium]   . Zocor [Simvastatin]     Past Medical History  Diagnosis Date  . CAD (coronary artery disease)   . HLD (hyperlipidemia)   . Recurrent chest pain   . Sleep apnea     cpap    Past Surgical History  Procedure Laterality Date  . Ptca    . Nasal sinus surgery      History  Smoking status  . Former Smoker  . Quit date: 04/22/2001  Smokeless tobacco  . Current User  . Types:  Snuff    History  Alcohol Use  . 0.0 oz/week    Family History  Problem Relation Age of Onset  . Coronary artery disease      Reviw of Systems:  Reviewed in the HPI.  All other systems are negative.  Physical Exam: Blood pressure 158/100, pulse 69, height 5\' 8"  (1.727 m), weight 222 lb 4 oz (100.812 kg). General: Well developed, well nourished, in no acute distress. Head: Normocephalic, atraumatic, sclera non-icteric, mucus membranes are moist,  Neck: Supple. Carotids are 2 + without bruits. No JVD  Lungs: Clear  Heart: RR, normal S1, S2 Abdomen: Soft, non-tender, non-distended with normal bowel sounds. Msk:  Strength and tone are normal  Extremities: No clubbing or cyanosis. No edema.  Distal pedal pulses are 2+ and equal  Neuro: CN II - XII intact.  Alert and oriented X 3.  Psych:  Normal   ECG: May 17, 2013:  NSR .  TWI in lead III - slightly worsened since previous tracing  Assessment / Plan:

## 2013-08-17 NOTE — Assessment & Plan Note (Signed)
He is stable .  No angina.  Continue current meds

## 2013-08-17 NOTE — Assessment & Plan Note (Signed)
His BP is still elevated.  He does not eat any extra salt.  Will re-try HCTZ 12.5 mg a day along with KCl 10 meq a day. He had labs with his medical doctor recently - I have asked him to forward those along. Will check BMP in 1 month. Office visit in 3 months with BMP.

## 2013-09-16 ENCOUNTER — Ambulatory Visit (INDEPENDENT_AMBULATORY_CARE_PROVIDER_SITE_OTHER): Payer: PRIVATE HEALTH INSURANCE | Admitting: *Deleted

## 2013-09-16 DIAGNOSIS — I2510 Atherosclerotic heart disease of native coronary artery without angina pectoris: Secondary | ICD-10-CM

## 2013-09-16 DIAGNOSIS — I10 Essential (primary) hypertension: Secondary | ICD-10-CM

## 2013-09-17 LAB — BASIC METABOLIC PANEL
BUN/Creatinine Ratio: 11 (ref 9–20)
BUN: 11 mg/dL (ref 6–24)
CALCIUM: 9.7 mg/dL (ref 8.7–10.2)
CHLORIDE: 97 mmol/L (ref 97–108)
CO2: 22 mmol/L (ref 18–29)
Creatinine, Ser: 1.04 mg/dL (ref 0.76–1.27)
GFR calc Af Amer: 96 mL/min/{1.73_m2} (ref >59)
GFR, EST NON AFRICAN AMERICAN: 83 mL/min/{1.73_m2} (ref >59)
GLUCOSE: 88 mg/dL (ref 65–99)
POTASSIUM: 4.8 mmol/L (ref 3.5–5.2)
Sodium: 137 mmol/L (ref 134–144)

## 2013-11-16 ENCOUNTER — Encounter: Payer: Self-pay | Admitting: Cardiovascular Disease

## 2013-11-16 ENCOUNTER — Ambulatory Visit (INDEPENDENT_AMBULATORY_CARE_PROVIDER_SITE_OTHER): Payer: No Typology Code available for payment source | Admitting: Cardiovascular Disease

## 2013-11-16 VITALS — BP 140/90 | HR 71 | Ht 69.0 in | Wt 220.2 lb

## 2013-11-16 DIAGNOSIS — I209 Angina pectoris, unspecified: Secondary | ICD-10-CM

## 2013-11-16 DIAGNOSIS — I2510 Atherosclerotic heart disease of native coronary artery without angina pectoris: Secondary | ICD-10-CM

## 2013-11-16 DIAGNOSIS — I158 Other secondary hypertension: Secondary | ICD-10-CM

## 2013-11-16 DIAGNOSIS — I159 Secondary hypertension, unspecified: Secondary | ICD-10-CM

## 2013-11-16 DIAGNOSIS — I25119 Atherosclerotic heart disease of native coronary artery with unspecified angina pectoris: Secondary | ICD-10-CM

## 2013-11-16 DIAGNOSIS — K219 Gastro-esophageal reflux disease without esophagitis: Secondary | ICD-10-CM

## 2013-11-16 MED ORDER — ESOMEPRAZOLE MAGNESIUM 40 MG PO PACK: 40.0000 mg | PACK | Freq: Every day | ORAL | Status: DC

## 2013-11-16 NOTE — Assessment & Plan Note (Signed)
Mario [name]  is doing very well. He's not had any episodes of chest pain. His medical doctor changed his Nexium to protonic because of the potential interaction with Plavix. Clinically, Mario [name] has noted that his blood has been been any bleeds quite readily if he cuts himself. In addition, it is been 12 years since his stent was placed and I would have anticipated problems early on if the Nexium was actually interfering with his Plavix metabolism.  At his request we will stop the Protonix and start him back on Nexium. 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    I will see him in 1 year.

## 2013-11-16 NOTE — Patient Instructions (Signed)
Your physician recommends that you return for lab work in:  2 months for [facility] has recommended you make the following change in your medication:  Stop Protonix  Start Nexium 40 mg once daily   Your physician wants you to follow-up in: 1 year. You will receive a reminder letter in the mail two months in advance. If you don't receive a letter, please call our office to schedule the follow-up appointment.

## 2013-11-16 NOTE — Progress Notes (Signed)
Mario [name] Date of Birth  December 25, 1960       [facility] Office [address], [address] [place], [place], [place]  [address]   Mario [name], [place]  [address] [phone]     [phone]   Fax  [phone]    Fax [phone]  Problem List: 1.  Coronary artery disease-status post PTCA and stenting in 2004 2.  Hyperlipidemia 3. Hypertension  History of Present Illness:  Mario [name] is a 53 yo with hx of CAD.,  He's had an upper respiratory tract viral infection for the past month or so.  He denies any chest pain.  He has been able to do of his normal activities without any significant problems. He works at the [employer] in [place].  [name]. 26, 2015: Mario [name] is doing well.  No angina.   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   BP has been elevated .  He has been tried on HCTZ in the past but it made him feel poorly.    August 17, 2013:  November 16, 2013: Bodies doing well from a cardiac standpoint. His medical doctor recently changed his Nexium to products because of the interaction and potential in addition of Plavix metabolism. He's noted that he's had lots more indigestion and heartburn since stopping the Nexium.  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 We discussed checking a P2 Y  12 assay with him on Plavix and Nexium to prove that he is still inhibited.  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 He notes that he bleeds quite readily if he cuts himself.   Current Outpatient Prescriptions on File Prior to Visit  Medication Sig Dispense Refill  . aspirin EC 81 MG tablet Take 1 tablet (81 mg total) by mouth daily.  90 tablet  3  . benazepril (LOTENSIN) 40 MG tablet Take 40 mg by mouth 2 (two) times daily.      . clopidogrel (PLAVIX) 75 MG tablet Take 1 tablet (75 mg total) by mouth daily.  90 tablet  3  . ezetimibe (ZETIA) 10 MG tablet Take 10 mg by  mouth daily.      . hydrochlorothiazide (HYDRODIURIL) 25 MG tablet Take 12.5 mg daily (1/2 tablet)  30 tablet  6  . potassium chloride (K-DUR) 10 MEQ tablet Take 1 tablet (10 mEq total) by mouth daily.  90 tablet  3   No current facility-administered medications on file prior to visit.    Allergies  Allergen Reactions  . Crestor [Rosuvastatin Calcium]   . Lipitor [Atorvastatin Calcium]   . Zocor [Simvastatin]     Past Medical History  Diagnosis Date  . CAD (coronary artery disease)   . HLD (hyperlipidemia)   . Recurrent chest pain   . Sleep apnea     cpap    Past Surgical History  Procedure Laterality Date  . Ptca    . Nasal sinus surgery      History  Smoking status  . Former Smoker  . Quit date: 04/22/2001  Smokeless tobacco  . Current User  . Types: Snuff    History  Alcohol Use  . 0.0 oz/week    Family History  Problem Relation Age of Onset  . Coronary artery disease    . Hyperlipidemia Mother   . Hypertension Mother   . Heart disease Mother   . Heart failure  Mother     59  . Hypertension Sister   . Hypertension Sister     Reviw of Systems:  Reviewed in the HPI.  All other systems are negative.  Physical Exam: Blood pressure 140/90, pulse 71, height 5\' 9"  (1.753 m), weight 220 lb 4 oz (99.905 kg). General: Well developed, well nourished, in no acute distress. Head: Normocephalic, atraumatic, sclera non-icteric, mucus membranes are moist,  Neck: Supple. Carotids are 2 + without bruits. No JVD  Lungs: Clear  Heart: RR, normal S1, S2 Abdomen: Soft, non-tender, non-distended with normal bowel sounds. Msk:  Strength and tone are normal  Extremities: No clubbing or cyanosis. No edema.  Distal pedal pulses are 2+ and equal  Neuro: CN II - XII intact.  Alert and oriented X 3.  Psych:  Normal   ECG: November 16, 2013:  NSR at 34.  No St changes.   Assessment / Plan:

## 2014-01-17 ENCOUNTER — Other Ambulatory Visit: Payer: No Typology Code available for payment source

## 2014-01-17 ENCOUNTER — Other Ambulatory Visit: Payer: Self-pay

## 2014-01-17 DIAGNOSIS — Z79899 Other long term (current) drug therapy: Secondary | ICD-10-CM

## 2014-01-17 DIAGNOSIS — I2510 Atherosclerotic heart disease of native coronary artery without angina pectoris: Secondary | ICD-10-CM

## 2014-08-12 NOTE — Op Note (Signed)
PATIENT NAME:  Mario [name], Mario [name] MR#:  [id number] DATE OF BIRTH:  01/03/1961  DATE OF PROCEDURE:  03/24/2013  PREOPERATIVE DIAGNOSIS: Septal deviation.   POSTOPERATIVE DIAGNOSIS: Septal deviation.  PROCEDURE: Nasal septoplasty.   SURGEON: Malon Kindle, MD.   ANESTHESIA: General endotracheal.   INDICATIONS: A 54 year old with sleep apnea and nasal obstruction with septal deviation.   FINDINGS: The septum was deviated to the left anteriorly and had a marked deviation midway back on the right side.   COMPLICATIONS: None.   DESCRIPTION OF PROCEDURE: After obtaining informed consent, the patient was taken to the operating room and placed in the supine position. After induction of general endotracheal anesthesia, the patient was turned 90 degrees. The nose was decongested with Afrin pledgets. Lidocaine 1% with epinephrine 1:200,000 was injected into either side of the nasal septum. He was then prepped and draped in the usual sterile fashion. A 15 blade was used to incise the septum along its caudal aspect on the left side. Mucoperichondrial flaps were raised on either side of the septal cartilage and elevated back over the bony cartilaginous junction as well as over the maxillary crest. There was a prominent septal spur on the right emanating from the maxillary crest. The bony cartilaginous junction was separated. A portion of the deviated bony septum was resected with scissors superiorly followed by use of a chisel inferiorly to release the deviated bone from the maxillary crest. The bony spur was then resected from the maxillary crest with a chisel. The cartilage was inspected and was deviated to the left anteriorly where there was some curvature to the anterior septal cartilage. This was liberally scored with a 15 blade to break up some of the memory of the cartilage. A portion of redundant cartilage, which was deviated over the maxillary crest was resected to allow the cartilage to relax into the  midline. The nose was then suctioned and was inspected for any bleeding. A pocket was made in the columella for the anterior septal cartilage to help line it up in the midline. The septal flaps were closed with 4-0 plain gut suture in a running quilting type stitch using extensive quilting to help prevent septal hematoma formation. The septal incision was closed with 4-0 chromic gut suture. A couple of plain gut sutures were placed anteriorly around nasal spine to help hold the cartilage midline during healing. He was then returned to the anesthesiologist for awakening. He was awakened and taken to the recovery room in good condition postoperatively. During the procedure, the inferior turbinates were gently outfractured slightly just to facilitate  visualization in the nasal cavity. Blood loss was less than 25 mL.   ____________________________ Sammuel Hines. Richardson Landry, MD psb:aw D: 03/24/2013 11:52:47 ET T: 03/24/2013 12:05:27 ET JOB#: [id number]  cc: Sammuel Hines. Richardson Landry, MD, <Dictator> Riley Nearing MD ELECTRONICALLY SIGNED 04/07/2013 17:29

## 2015-03-02 ENCOUNTER — Telehealth: Payer: Self-pay | Admitting: Cardiovascular Disease

## 2015-03-02 NOTE — Telephone Encounter (Signed)
Patient has busy schedule will call back when able to  Schedule.   Deleting recall.

## 2015-04-03 ENCOUNTER — Other Ambulatory Visit: Payer: Self-pay | Admitting: Family Medicine

## 2015-04-03 NOTE — Telephone Encounter (Signed)
apt 

## 2015-05-08 ENCOUNTER — Encounter: Payer: Self-pay | Admitting: Family Medicine

## 2015-08-22 ENCOUNTER — Encounter: Payer: Self-pay | Admitting: Physician Assistant

## 2015-08-22 ENCOUNTER — Ambulatory Visit: Payer: Self-pay | Admitting: Physician Assistant

## 2015-08-22 VITALS — BP 139/79 | HR 85 | Temp 99.0°F

## 2015-08-22 DIAGNOSIS — J069 Acute upper respiratory infection, unspecified: Secondary | ICD-10-CM

## 2015-08-22 DIAGNOSIS — R509 Fever, unspecified: Secondary | ICD-10-CM

## 2015-08-22 DIAGNOSIS — W57XXXA Bitten or stung by nonvenomous insect and other nonvenomous arthropods, initial encounter: Secondary | ICD-10-CM

## 2015-08-22 LAB — POCT INFLUENZA A/B
INFLUENZA A, POC: NEGATIVE
INFLUENZA B, POC: NEGATIVE

## 2015-08-22 MED ORDER — DOXYCYCLINE HYCLATE 100 MG PO TABS: 100.0000 mg | ORAL_TABLET | Freq: Two times a day (BID) | ORAL | Status: DC

## 2015-08-22 NOTE — Progress Notes (Signed)
S: C/o runny nose and congestion with dry cough for 5 days, + fever, chills, denies cp/sob, v/d; mucus was green this am but clear throughout the day, cough is sporadic, also had tick bites, multiple, red rash surrounding bites  Using otc meds: ?small red pill ok to take with high bp  O: PE: vitals wnl, nad,  perrl eomi, normocephalic, tms dull, nasal mucosa red and swollen, throat injected, neck supple no lymph, lungs c t a, cv rrr, neuro intact, flu swab neg  A:  Acute flu like illness, tick bite   P: doxy 100mg  bid x 10d ; drink fluids, continue regular meds , use otc meds of choice, return if not improving in 5 days, return earlier if worsening

## 2015-08-28 ENCOUNTER — Ambulatory Visit: Payer: Self-pay | Admitting: Physician Assistant

## 2015-08-28 ENCOUNTER — Encounter: Payer: Self-pay | Admitting: Physician Assistant

## 2015-08-28 VITALS — BP 130/90 | HR 77 | Temp 98.4°F

## 2015-08-28 DIAGNOSIS — J018 Other acute sinusitis: Secondary | ICD-10-CM

## 2015-08-28 MED ORDER — AMOXICILLIN-POT CLAVULANATE 875-125 MG PO TABS: 1.0000 | ORAL_TABLET | Freq: Two times a day (BID) | ORAL | Status: DC

## 2015-08-28 NOTE — Progress Notes (Signed)
S: C/o runny nose and congestion for over a week, + fever, chills, denies cp/sob, v/d; mucus is green and thick, cough is sporadic, c/o of facial and dental pain. staes he is still taking the doxy, had multiple tick bites with fever last week, flu test was neg, now head is really congested and has green mucus just pouring out, doxy isn't helping with this  O: PE: vitals wnl, nad perrl eomi, normocephalic, tms dull, nasal mucosa red and swollen, throat injected, neck supple no lymph, lungs c t a, cv rrr, neuro intact  A:  Acute sinusitis   P: augmentin, finish doxy then start augmentin, if get diarrhea call the clinic, flonase, saline nasal spray;  drink fluids, continue regular meds , use otc meds of choice, return if not improving in 5 days, return earlier if worsening , will do labs if not improved by end of the week

## 2015-10-09 ENCOUNTER — Encounter: Payer: Self-pay | Admitting: Family Medicine

## 2015-10-09 ENCOUNTER — Ambulatory Visit (INDEPENDENT_AMBULATORY_CARE_PROVIDER_SITE_OTHER): Payer: Managed Care, Other (non HMO) | Admitting: Family Medicine

## 2015-10-09 VITALS — BP 136/82 | HR 73 | Temp 98.3°F | Ht 69.1 in | Wt 217.0 lb

## 2015-10-09 DIAGNOSIS — I25119 Atherosclerotic heart disease of native coronary artery with unspecified angina pectoris: Secondary | ICD-10-CM

## 2015-10-09 DIAGNOSIS — Z1211 Encounter for screening for malignant neoplasm of colon: Secondary | ICD-10-CM | POA: Diagnosis not present

## 2015-10-09 DIAGNOSIS — G4730 Sleep apnea, unspecified: Secondary | ICD-10-CM | POA: Insufficient documentation

## 2015-10-09 DIAGNOSIS — I159 Secondary hypertension, unspecified: Secondary | ICD-10-CM | POA: Diagnosis not present

## 2015-10-09 LAB — LP+ALT+AST PICCOLO, WAIVED
ALT (SGPT) Piccolo, Waived: 27 U/L (ref 10–47)
AST (SGOT) PICCOLO, WAIVED: 35 U/L (ref 11–38)
CHOL/HDL RATIO PICCOLO,WAIVE: 2.9 mg/dL
Cholesterol Piccolo, Waived: 217 mg/dL — ABNORMAL HIGH (ref <200)
HDL CHOL PICCOLO, WAIVED: 76 mg/dL (ref >59)
LDL Chol Calc Piccolo Waived: 100 mg/dL — ABNORMAL HIGH (ref <100)
TRIGLYCERIDES PICCOLO,WAIVED: 207 mg/dL — AB (ref <150)
VLDL Chol Calc Piccolo,Waive: 41 mg/dL — ABNORMAL HIGH (ref <30)

## 2015-10-09 MED ORDER — BENAZEPRIL HCL 40 MG PO TABS: 40.0000 mg | ORAL_TABLET | Freq: Two times a day (BID) | ORAL | Status: DC

## 2015-10-09 MED ORDER — CLOPIDOGREL BISULFATE 75 MG PO TABS: 75.0000 mg | ORAL_TABLET | Freq: Every day | ORAL | Status: DC

## 2015-10-09 MED ORDER — EZETIMIBE 10 MG PO TABS: 10.0000 mg | ORAL_TABLET | Freq: Every day | ORAL | Status: DC

## 2015-10-09 NOTE — Assessment & Plan Note (Signed)
The current medical regimen is effective;  continue present plan and medications.  

## 2015-10-09 NOTE — Progress Notes (Signed)
BP 136/82 mmHg  Pulse 73  Temp(Src) 98.3 F (36.8 C)  Ht 5' 9.1" (1.755 m)  Wt 217 lb (98.431 kg)  BMI 31.96 kg/m2  SpO2 98%   Subjective:    Patient ID: Mario [name], male    DOB: 10/20/1960, 55 y.o.   MRN: [id number]  HPI: Mario [name] is a 55 y.o. male  Chief Complaint  Patient presents with  . Hypertension  . Coronary Artery Disease  Patient presents for follow up today. Denies any new concerns or symptoms.   States BPs are running a bit high in the afternoons, but overall doing ok. 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 Followed by cardiology. Denies CP or SOB. Doing well on aspirin and plavix. No abnormal bleeding or bruising.   Relevant past medical, surgical, family and social history reviewed and updated as indicated. Interim medical history since our last visit reviewed. Allergies and medications reviewed and updated.  Review of Systems  Constitutional: Negative.   Respiratory: Negative.   Cardiovascular: Negative.     Per HPI unless specifically indicated above     Objective:    BP 136/82 mmHg  Pulse 73  Temp(Src) 98.3 F (36.8 C)  Ht 5' 9.1" (1.755 m)  Wt 217 lb (98.431 kg)  BMI 31.96 kg/m2  SpO2 98%  Wt Readings from Last 3 Encounters:  10/09/15 217 lb (98.431 kg)  11/16/13 220 lb 4 oz (99.905 kg)  08/17/13 222 lb 4 oz (100.812 kg)    Physical Exam  Constitutional: He is oriented to person, place, and time. He appears well-developed and well-nourished. No distress.  HENT:  Head: Normocephalic and atraumatic.  Right Ear: Hearing normal.  Left Ear: Hearing normal.  Nose: Nose normal.  Eyes: Conjunctivae and lids are normal. Right eye exhibits no discharge. Left eye exhibits no discharge. No scleral icterus.  Cardiovascular: Regular rhythm and normal heart sounds.   Pulmonary/Chest: Effort normal and breath sounds normal. No respiratory distress.  Musculoskeletal: Normal range of motion.  Neurological: He is alert and  oriented to person, place, and time.  Skin: Skin is intact. No rash noted.  Psychiatric: He has a normal mood and affect. His speech is normal and behavior is normal. Judgment and thought content normal. Cognition and memory are normal.    Results for orders placed or performed in visit on 10/09/15  LP+ALT+AST Piccolo, [employer]  Result Value Ref Range   ALT (SGPT) Piccolo, Waived 27 10 - 47 U/L   AST (SGOT) Piccolo, Waived 35 11 - 38 U/L   Cholesterol Piccolo, Waived 217 (H) <200 mg/dL   HDL Chol Piccolo, Waived 76 >59 mg/dL   Triglycerides Piccolo,Waived 207 (H) <150 mg/dL   Chol/HDL Ratio Piccolo,Waive 2.9 mg/dL   LDL Chol Calc Piccolo Waived 100 (H) <100 mg/dL   VLDL Chol Calc Piccolo,Waive 41 (H) <30 mg/dL      Assessment & Plan:   Problem List Items Addressed This Visit      Cardiovascular and Mediastinum   HTN (hypertension) - Primary    The current medical regimen is effective;  continue present plan and medications.       Relevant Medications   benazepril (LOTENSIN) 40 MG tablet   ezetimibe (ZETIA) 10 MG tablet   Other Relevant Orders   LP+ALT+AST Piccolo, Waived (Completed)   Basic metabolic panel   CAD (coronary artery disease)    The current medical regimen is effective;  continue present plan and medications.  Relevant Medications   benazepril (LOTENSIN) 40 MG tablet   ezetimibe (ZETIA) 10 MG tablet   Other Relevant Orders   LP+ALT+AST Piccolo, Waived (Completed)   Basic metabolic panel     Other   Sleep apnea    Other Visit Diagnoses    Colon cancer screening        Relevant Orders    Ambulatory referral to General Surgery        Follow up plan: Return for Physical Exam.

## 2015-10-10 ENCOUNTER — Encounter: Payer: Self-pay | Admitting: Family Medicine

## 2015-10-10 LAB — BASIC METABOLIC PANEL
BUN / CREAT RATIO: 17 (ref 9–20)
BUN: 17 mg/dL (ref 6–24)
CO2: 19 mmol/L (ref 18–29)
CREATININE: 1.03 mg/dL (ref 0.76–1.27)
Calcium: 9.3 mg/dL (ref 8.7–10.2)
Chloride: 101 mmol/L (ref 96–106)
GFR calc Af Amer: 96 mL/min/{1.73_m2} (ref >59)
GFR, EST NON AFRICAN AMERICAN: 83 mL/min/{1.73_m2} (ref >59)
Glucose: 93 mg/dL (ref 65–99)
Potassium: 4.4 mmol/L (ref 3.5–5.2)
SODIUM: 141 mmol/L (ref 134–144)

## 2015-10-12 ENCOUNTER — Telehealth: Payer: Self-pay

## 2015-10-12 ENCOUNTER — Other Ambulatory Visit: Payer: Self-pay

## 2015-10-12 NOTE — Telephone Encounter (Signed)
Gastroenterology Pre-Procedure Review  Request Date: 11/17/15 Requesting Physician: Dr. Jeananne Rama  PATIENT REVIEW QUESTIONS: The patient responded to the following health history questions as indicated:    1. Are you having any GI issues? no 2. Do you have a personal history of Polyps? no 3. Do you have a family history of Colon Cancer or Polyps? no 4. Diabetes Mellitus? no 5. Joint replacements in the past 12 months?no 6. Major health problems in the past 3 months?no 7. Any artificial heart valves, MVP, or defibrillator? Plavix 75mg     MEDICATIONS & ALLERGIES:    Patient reports the following regarding taking any anticoagulation/antiplatelet therapy:   Plavix, Coumadin, Eliquis, Xarelto, Lovenox, Pradaxa, Brilinta, or Effient? Plavix 75mg  Aspirin? yes (ASA 81mg )  Patient confirms/reports the following medications:  Current Outpatient Prescriptions  Medication Sig Dispense Refill  . aspirin EC 81 MG tablet Take 1 tablet (81 mg total) by mouth daily. 90 tablet 3  . benazepril (LOTENSIN) 40 MG tablet Take 1 tablet (40 mg total) by mouth 2 (two) times daily. 90 tablet 1  . clopidogrel (PLAVIX) 75 MG tablet Take 1 tablet (75 mg total) by mouth daily. 90 tablet 1  . ezetimibe (ZETIA) 10 MG tablet Take 1 tablet (10 mg total) by mouth daily. 90 tablet 1   No current facility-administered medications for this visit.    Patient confirms/reports the following allergies:  Allergies  Allergen Reactions  . Crestor [Rosuvastatin Calcium]   . Lipitor [Atorvastatin Calcium]   . Zocor [Simvastatin]     No orders of the defined types were placed in this encounter.    AUTHORIZATION INFORMATION Primary Insurance: 1D#: Group #:  Secondary Insurance: 1D#: Group #:  SCHEDULE INFORMATION: Date: 11/17/15 Time: Location: [place]

## 2015-11-06 ENCOUNTER — Ambulatory Visit (INDEPENDENT_AMBULATORY_CARE_PROVIDER_SITE_OTHER): Payer: Managed Care, Other (non HMO) | Admitting: Cardiovascular Disease

## 2015-11-06 ENCOUNTER — Encounter: Payer: Self-pay | Admitting: Cardiovascular Disease

## 2015-11-06 VITALS — BP 170/100 | HR 72 | Ht 69.0 in | Wt 221.0 lb

## 2015-11-06 DIAGNOSIS — I10 Essential (primary) hypertension: Secondary | ICD-10-CM

## 2015-11-06 DIAGNOSIS — E785 Hyperlipidemia, unspecified: Secondary | ICD-10-CM | POA: Insufficient documentation

## 2015-11-06 DIAGNOSIS — I2510 Atherosclerotic heart disease of native coronary artery without angina pectoris: Secondary | ICD-10-CM | POA: Diagnosis not present

## 2015-11-06 MED ORDER — VALSARTAN 160 MG PO TABS: 160.0000 mg | ORAL_TABLET | Freq: Every day | ORAL | Status: DC

## 2015-11-06 NOTE — Progress Notes (Signed)
Mario [name] Date of Birth  [birth year]       [facility] Office [address] [place], [place], [place]  [address]   [place], Mario [name]  [address] [phone]     [phone]   Fax  [phone]    Fax [phone]  Problem List: 1.  Coronary artery disease-status post PTCA and stenting in 2004 2.  Hyperlipidemia 3. Hypertension     Mario [name] is a 55 yo with hx of CAD.,  He's had an upper respiratory tract viral infection for the past month or so.  He denies any chest pain.  He has been able to do of his normal activities without any significant problems. He works at the [employer] in [place].  [name]. 26, 2015: Mario [name] is doing well.  No angina.   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   BP has been elevated .  He has been tried on HCTZ in the past but it made him feel poorly.    August 17, 2013:  November 16, 2013: Bodies doing well from a cardiac standpoint. His medical doctor recently changed his Nexium to products because of the interaction and potential in addition of Plavix metabolism. He's noted that he's had lots more indigestion and heartburn since stopping the Nexium.  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 We discussed checking a P2 Y  12 assay with him on Plavix and Nexium to prove that he is still inhibited.  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 He notes that he bleeds quite readily if he cuts himself.  November 06, 2015: Mario [name] is seen after a 2 year absence. He has a history of coronary artery disease and hypertension BP has been elevated  Still eats some salty foods.  No CP,  Still very active    Current Outpatient Prescriptions on File Prior to Visit  Medication Sig Dispense Refill  . aspirin EC 81 MG tablet Take 1 tablet (81 mg total) by mouth daily. 90 tablet 3  . benazepril (LOTENSIN) 40 MG tablet Take 1 tablet (40 mg  total) by mouth 2 (two) times daily. 90 tablet 1  . clopidogrel (PLAVIX) 75 MG tablet Take 1 tablet (75 mg total) by mouth daily. 90 tablet 1  . ezetimibe (ZETIA) 10 MG tablet Take 1 tablet (10 mg total) by mouth daily. 90 tablet 1   No current facility-administered medications on file prior to visit.    Allergies  Allergen Reactions  . Crestor [Rosuvastatin Calcium]   . Lipitor [Atorvastatin Calcium]   . Zocor [Simvastatin]     Past Medical History  Diagnosis Date  . CAD (coronary artery disease)   . HLD (hyperlipidemia)   . Recurrent chest pain   . Sleep apnea     cpap    Past Surgical History  Procedure Laterality Date  . Ptca    . Nasal sinus surgery      History  Smoking status  . Former Smoker  . Quit date: 04/22/2001  Smokeless tobacco  . Current User  . Types: Snuff    History  Alcohol Use  . 0.0 oz/week    Family History  Problem Relation Age of Onset  . Coronary artery disease    . Hyperlipidemia Mother   . Hypertension Mother   . Heart disease Mother   . Heart failure Mother  80  . Hypertension Sister   . Hypertension Sister     Reviw of Systems:  Reviewed in the HPI.  All other systems are negative.  Physical Exam: Blood pressure 170/100, pulse 72, height 5\' 9"  (1.753 m), weight 221 lb (100.245 kg). General: Well developed, well nourished, in no acute distress. Head: Normocephalic, atraumatic, sclera non-icteric, mucus membranes are moist,  Neck: Supple. Carotids are 2 + without bruits. No JVD  Lungs: Clear  Heart: RR, normal S1, S2 Abdomen: Soft, non-tender, non-distended with normal bowel sounds. Msk:  Strength and tone are normal  Extremities: No clubbing or cyanosis. No edema.  Distal pedal pulses are 2+ and equal  Neuro: CN II - XII intact.  Alert and oriented X 3.  Psych:  Normal   ECG: November 06, 2015:  NSR at 72.  No St changes.   Assessment / Plan:   1.  Coronary artery disease-status post PTCA and stenting in  2004 Mario [name] seems to be doing very well. He's not having any angina. He is still on Plavix. He will need to hold his Plavix for 5 days prior to his colonoscopy. He should then resume the Plavix as soon as it is deemed safe by the gastroenterologist.   2.  Hyperlipidemia - his last LDL is 100. Continue Zetia. He does not tolerate the statins.  3. Hypertension:  Blood pressure is a little high. We will discontinue the benazepril and start him on valsartan 160 mg a day. We'll check basic medical profile in 3 weeks.  4. Colonoscopy:   He scheduled have a colonoscopy in several weeks. I've given him the OK to hold his Plavix for 5 days prior to the colonoscopy. He may also need to hold his aspirin which he may hold for 5 days as well.   I'll see him again in one year.    Mertie Moores, MD  11/06/2015 9:08 AM    [address],  [place], [place]  [address] Pager [phone] Phone: [phone]; Fax: [phone]

## 2015-11-06 NOTE — Patient Instructions (Signed)
Medication Instructions:  STOP Benazepril START Valsartan 160 mg once daily   Labwork: Your physician recommends that you return for lab work in: 3 weeks for basic metabolic panel   Testing/Procedures: None ordered   Follow-Up: Your physician wants you to follow-up in: 1 year with Dr. Acie Fredrickson.  You will receive a reminder letter in the mail two months in advance. If you don't receive a letter, please call our office to schedule the follow-up appointment.   If you need a refill on your cardiac medications before your next appointment, please call your pharmacy.   Thank you for choosing [facility]! Christen Bame, RN [phone]

## 2015-11-13 ENCOUNTER — Encounter: Payer: Self-pay | Admitting: *Deleted

## 2015-11-13 NOTE — Anesthesia Preprocedure Evaluation (Addendum)
Anesthesia Evaluation  Patient identified by MRN, date of birth, ID band  Reviewed: NPO status   History of Anesthesia Complications Negative for: history of anesthetic complications  Airway Mallampati: II  TM Distance: >3 FB Neck ROM: full    Dental no notable dental hx.    Pulmonary sleep apnea and Continuous Positive Airway Pressure Ventilation , former smoker,    Pulmonary exam normal        Cardiovascular Exercise Tolerance: Good hypertension, + CAD and + Cardiac Stents (3 stents)  Normal cardiovascular exam   Per Cardiology note from 11/06/15 "Coronary artery disease-status post PTCA and stenting in 2004 Mario [name] seems to be doing very well. He's not having any angina. He is still on Plavix. He will need to hold his Plavix for 5 days prior to his colonoscopy. He should then resume the Plavix as soon as it is deemed safe by the gastroenterologist."    Neuro/Psych negative neurological ROS  negative psych ROS   GI/Hepatic negative GI ROS, Neg liver ROS,   Endo/Other  negative endocrine ROS  Renal/GU negative Renal ROS  negative genitourinary   Musculoskeletal  (+) Arthritis ,   Abdominal   Peds  Hematology negative hematology ROS (+)   Anesthesia Other Findings Last Plavix: 6 days ago  Reproductive/Obstetrics                            Anesthesia Physical Anesthesia Plan  ASA: II  Anesthesia Plan: MAC   Post-op Pain Management:    Induction:   Airway Management Planned:   Additional Equipment:   Intra-op Plan:   Post-operative Plan:   Informed Consent: I have reviewed the patients History and Physical, chart, labs and discussed the procedure including the risks, benefits and alternatives for the proposed anesthesia with the patient or authorized representative who has indicated his/her understanding and acceptance.     Plan Discussed with: CRNA  Anesthesia Plan Comments:          Anesthesia Quick Evaluation

## 2015-11-13 NOTE — Discharge Instructions (Signed)

## 2015-11-17 ENCOUNTER — Ambulatory Visit: Payer: Managed Care, Other (non HMO) | Admitting: Anesthesiology

## 2015-11-17 ENCOUNTER — Ambulatory Visit
Admission: RE | Admit: 2015-11-17 | Discharge: 2015-11-17 | Disposition: A | Discharge status: Home / Self Care | Payer: Managed Care, Other (non HMO) | Source: Ambulatory Visit | Attending: Gastroenterology | Admitting: Gastroenterology

## 2015-11-17 ENCOUNTER — Encounter
Admission: RE | Disposition: A | Discharge status: Home / Self Care | Payer: Self-pay | Source: Ambulatory Visit | Attending: Gastroenterology

## 2015-11-17 DIAGNOSIS — Z8249 Family history of ischemic heart disease and other diseases of the circulatory system: Secondary | ICD-10-CM | POA: Insufficient documentation

## 2015-11-17 DIAGNOSIS — E785 Hyperlipidemia, unspecified: Secondary | ICD-10-CM | POA: Diagnosis not present

## 2015-11-17 DIAGNOSIS — D125 Benign neoplasm of sigmoid colon: Secondary | ICD-10-CM | POA: Diagnosis not present

## 2015-11-17 DIAGNOSIS — Z8349 Family history of other endocrine, nutritional and metabolic diseases: Secondary | ICD-10-CM | POA: Diagnosis not present

## 2015-11-17 DIAGNOSIS — M1990 Unspecified osteoarthritis, unspecified site: Secondary | ICD-10-CM | POA: Diagnosis not present

## 2015-11-17 DIAGNOSIS — F17220 Nicotine dependence, chewing tobacco, uncomplicated: Secondary | ICD-10-CM | POA: Insufficient documentation

## 2015-11-17 DIAGNOSIS — Z1211 Encounter for screening for malignant neoplasm of colon: Secondary | ICD-10-CM

## 2015-11-17 DIAGNOSIS — Z888 Allergy status to other drugs, medicaments and biological substances status: Secondary | ICD-10-CM | POA: Insufficient documentation

## 2015-11-17 DIAGNOSIS — Z79899 Other long term (current) drug therapy: Secondary | ICD-10-CM | POA: Insufficient documentation

## 2015-11-17 DIAGNOSIS — Z7982 Long term (current) use of aspirin: Secondary | ICD-10-CM | POA: Diagnosis not present

## 2015-11-17 DIAGNOSIS — I2510 Atherosclerotic heart disease of native coronary artery without angina pectoris: Secondary | ICD-10-CM | POA: Insufficient documentation

## 2015-11-17 DIAGNOSIS — G4730 Sleep apnea, unspecified: Secondary | ICD-10-CM | POA: Diagnosis not present

## 2015-11-17 DIAGNOSIS — Z955 Presence of coronary angioplasty implant and graft: Secondary | ICD-10-CM | POA: Diagnosis not present

## 2015-11-17 DIAGNOSIS — Z7902 Long term (current) use of antithrombotics/antiplatelets: Secondary | ICD-10-CM | POA: Diagnosis not present

## 2015-11-17 DIAGNOSIS — K641 Second degree hemorrhoids: Secondary | ICD-10-CM | POA: Diagnosis not present

## 2015-11-17 HISTORY — DX: Unspecified osteoarthritis, unspecified site: M19.90

## 2015-11-17 HISTORY — PX: POLYPECTOMY: SHX5525

## 2015-11-17 HISTORY — PX: COLONOSCOPY WITH PROPOFOL: SHX5780

## 2015-11-17 SURGERY — COLONOSCOPY WITH PROPOFOL
Anesthesia: Monitor Anesthesia Care | Site: Rectum | Wound class: Contaminated

## 2015-11-17 MED ORDER — LIDOCAINE HCL (CARDIAC) 20 MG/ML IV SOLN
INTRAVENOUS | Status: DC | PRN
  Administered 2015-11-17: 30 mg via INTRAVENOUS

## 2015-11-17 MED ORDER — STERILE WATER FOR IRRIGATION IR SOLN
Status: DC | PRN
  Administered 2015-11-17: 08:00:00

## 2015-11-17 MED ORDER — PROPOFOL 10 MG/ML IV BOLUS
INTRAVENOUS | Status: DC | PRN
  Administered 2015-11-17 (×4): 50 mg via INTRAVENOUS
  Administered 2015-11-17: 20 mg via INTRAVENOUS
  Administered 2015-11-17 (×2): 50 mg via INTRAVENOUS
  Administered 2015-11-17: 20 mg via INTRAVENOUS
  Administered 2015-11-17: 10 mg via INTRAVENOUS

## 2015-11-17 MED ORDER — OXYCODONE HCL 5 MG/5ML PO SOLN: 5.0000 mg | Freq: Once | ORAL | Status: DC | PRN

## 2015-11-17 MED ORDER — LACTATED RINGERS IV SOLN
INTRAVENOUS | Status: DC
  Administered 2015-11-17: 07:00:00 via INTRAVENOUS

## 2015-11-17 MED ORDER — OXYCODONE HCL 5 MG PO TABS: 5.0000 mg | ORAL_TABLET | Freq: Once | ORAL | Status: DC | PRN

## 2015-11-17 SURGICAL SUPPLY — 23 items
CANISTER SUCT 1200ML W/VALVE (MISCELLANEOUS) ×3 IMPLANT
CLIP HMST 235XBRD CATH ROT (MISCELLANEOUS) IMPLANT
CLIP RESOLUTION 360 11X235 (MISCELLANEOUS)
FCP ESCP3.2XJMB 240X2.8X (MISCELLANEOUS)
FORCEPS BIOP RAD 4 LRG CAP 4 (CUTTING FORCEPS) IMPLANT
FORCEPS BIOP RJ4 240 W/NDL (MISCELLANEOUS)
FORCEPS ESCP3.2XJMB 240X2.8X (MISCELLANEOUS) IMPLANT
GOWN CVR UNV OPN BCK APRN NK (MISCELLANEOUS) ×2 IMPLANT
GOWN ISOL THUMB LOOP REG UNIV (MISCELLANEOUS) ×4
INJECTOR VARIJECT VIN23 (MISCELLANEOUS) IMPLANT
KIT DEFENDO VALVE AND CONN (KITS) IMPLANT
KIT ENDO PROCEDURE OLY (KITS) ×3 IMPLANT
MARKER SPOT ENDO TATTOO 5ML (MISCELLANEOUS) IMPLANT
PAD GROUND ADULT SPLIT (MISCELLANEOUS) IMPLANT
PROBE APC STR FIRE (PROBE) IMPLANT
RETRIEVER NET ROTH 2.5X230 LF (MISCELLANEOUS) ×3 IMPLANT
SNARE SHORT THROW 13M SML OVAL (MISCELLANEOUS) ×3 IMPLANT
SNARE SHORT THROW 30M LRG OVAL (MISCELLANEOUS) IMPLANT
SNARE SNG USE RND 15MM (INSTRUMENTS) IMPLANT
SPOT EX ENDOSCOPIC TATTOO (MISCELLANEOUS)
TRAP ETRAP POLY (MISCELLANEOUS) IMPLANT
VARIJECT INJECTOR VIN23 (MISCELLANEOUS)
WATER STERILE IRR 250ML POUR (IV SOLUTION) ×3 IMPLANT

## 2015-11-17 NOTE — Transfer of Care (Signed)
Immediate Anesthesia Transfer of Care Note  Patient: Mario [name]  Procedure(s) Performed: Procedure(s) with comments: COLONOSCOPY WITH PROPOFOL (N/A) - CPAP POLYPECTOMY (N/A) - sigmoid polyp  Patient Location: PACU  Anesthesia Type: MAC  Level of Consciousness: awake, alert  and patient cooperative  Airway and Oxygen Therapy: Patient Spontanous Breathing and Patient connected to supplemental oxygen  Post-op Assessment: Post-op Vital signs reviewed, Patient's Cardiovascular Status Stable, Respiratory Function Stable, Patent Airway and No signs of Nausea or vomiting  Post-op Vital Signs: Reviewed and stable  Complications: No apparent anesthesia complications

## 2015-11-17 NOTE — H&P (Signed)
  Lucilla Lame, MD [facility] [address], [place], [place] [address] Phone: [phone] Fax : [phone]  Primary Care Physician:  Golden Pop, MD Primary Gastroenterologist:  Dr. Allen Norris  Pre-Procedure History & Physical: HPI:  Mario [name] is a 55 y.o. male is here for a screening colonoscopy.   Past Medical History:  Diagnosis Date  . Arthritis   . CAD (coronary artery disease)   . HLD (hyperlipidemia)   . Recurrent chest pain   . Sleep apnea    cpap    Past Surgical History:  Procedure Laterality Date  . NASAL SINUS SURGERY    . PTCA     3 stents    Prior to Admission medications   Medication Sig Start Date End Date Taking? Authorizing Provider  aspirin EC 81 MG tablet Take 1 tablet (81 mg total) by mouth daily. 08/17/13  Yes Thayer Headings, MD  clopidogrel (PLAVIX) 75 MG tablet Take 1 tablet (75 mg total) by mouth daily. 10/09/15  Yes [name], MD  ezetimibe (ZETIA) 10 MG tablet Take 1 tablet (10 mg total) by mouth daily. 10/09/15  Yes [name], MD  valsartan (DIOVAN) 160 MG tablet Take 1 tablet (160 mg total) by mouth daily. 11/06/15  Yes Thayer Headings, MD    Allergies as of 10/12/2015 - Review Complete 10/09/2015  Allergen Reaction Noted  . Crestor [rosuvastatin calcium]  07/30/2011  . Lipitor [atorvastatin calcium]  07/30/2011  . Zocor [simvastatin]  07/30/2011    Family History  Problem Relation Age of Onset  . Hyperlipidemia Mother   . Hypertension Mother   . Heart disease Mother   . Heart failure Mother     61  . Hypertension Sister   . Hypertension Sister   . Coronary artery disease      Social History   Social History  . Marital status: Married    Spouse name: N/A  . Number of children: N/A  . Years of education: N/A   Occupational History  . Not on file.   Social History Main Topics  . Smoking status: Former Smoker    Quit date: 04/22/2001  . Smokeless tobacco: Current User    Types: Snuff  . Alcohol use 10.8  oz/week    18 Cans of beer per week  . Drug use: No  . Sexual activity: Not on file   Other Topics Concern  . Not on file   Social History Narrative  . No narrative on file    Review of Systems: See HPI, otherwise negative ROS  Physical Exam: BP 131/75   Pulse 65   Temp 97.9 F (36.6 C) (Temporal)   Resp 16   Ht 5\' 9"  (1.753 m)   Wt 210 lb (95.3 kg)   SpO2 100%   BMI 31.01 kg/m  General:   Alert,  pleasant and cooperative in NAD Head:  Normocephalic and atraumatic. Neck:  Supple; no masses or thyromegaly. Lungs:  Clear throughout to auscultation.    Heart:  Regular rate and rhythm. Abdomen:  Soft, nontender and nondistended. Normal bowel sounds, without guarding, and without rebound.   Neurologic:  Alert and  oriented x4;  grossly normal neurologically.  Impression/Plan: Mario [name] is now here to undergo a screening colonoscopy.  Risks, benefits, and alternatives regarding colonoscopy have been reviewed with the patient.  Questions have been answered.  All parties agreeable.

## 2015-11-17 NOTE — Op Note (Signed)
[facility] Gastroenterology Patient Name: Mario [name] Procedure Date: 11/17/2015 7:18 AM MRN: [id number] Account #: [id number] Date of Birth: March 18, 1961 Admit Type: Outpatient Age: 55 Room: [facility] OR ROOM 01 Gender: Male Note Status: Finalized Procedure:            Colonoscopy Indications:          Screening for colorectal malignant neoplasm Providers:            Lucilla Lame MD, MD Referring MD:         [name], MD (Referring MD) Medicines:            Propofol per Anesthesia Complications:        No immediate complications. Procedure:            Pre-Anesthesia Assessment:                       - Prior to the procedure, a History and Physical was                        performed, and patient medications and allergies were                        reviewed. The patient's tolerance of previous                        anesthesia was also reviewed. The risks and benefits of                        the procedure and the sedation options and risks were                        discussed with the patient. All questions were                        answered, and informed consent was obtained. Prior                        Anticoagulants: The patient has taken no previous                        anticoagulant or antiplatelet agents. ASA Grade                        Assessment: II - A patient with mild systemic disease.                        After reviewing the risks and benefits, the patient was                        deemed in satisfactory condition to undergo the                        procedure.                       After obtaining informed consent, the colonoscope was                        passed under direct vision. Throughout the procedure,  the patient's blood pressure, pulse, and oxygen                        saturations were monitored continuously. The Olympus                        CF-HQ190L Colonoscope (S#. [id number]) was introduced                   through the anus and advanced to the the cecum,                        identified by appendiceal orifice and ileocecal valve.                        The quality of the bowel preparation was excellent. Findings:      The perianal and digital rectal examinations were normal.      A 6 mm polyp was found in the sigmoid colon. The polyp was pedunculated.       The polyp was removed with a cold snare. Resection and retrieval were       complete.      Non-bleeding internal hemorrhoids were found during retroflexion. The       hemorrhoids were Grade II (internal hemorrhoids that prolapse but reduce       spontaneously). Impression:           - One 6 mm polyp in the sigmoid colon, removed with a                        cold snare. Resected and retrieved.                       - Non-bleeding internal hemorrhoids. Recommendation:       - Await pathology results.                       - Repeat colonoscopy in 5 years if polyp adenoma and 10                        years if hyperplastic Procedure Code(s):    --- Professional ---                       [phone], Colonoscopy, flexible; with removal of tumor(s),                        polyp(s), or other lesion(s) by snare technique Diagnosis Code(s):    --- Professional ---                       Z12.11, Encounter for screening for malignant neoplasm                        of colon                       D12.5, Benign neoplasm of sigmoid colon CPT copyright 2016 American Medical Association. All rights reserved. The codes documented in this report are preliminary and upon coder review may  be revised to meet current compliance requirements. Lucilla Lame MD, MD 11/17/2015 8:00:20 AM This report has been signed electronically. Number of Addenda: 0 Note Initiated  On: 11/17/2015 7:18 AM Scope Withdrawal Time: 0 hours 7 minutes 37 seconds  Total Procedure Duration: 0 hours 10 minutes 40 seconds       [facility]

## 2015-11-17 NOTE — Anesthesia Postprocedure Evaluation (Signed)
Anesthesia Post Note  Patient: Mario [name]  Procedure(s) Performed: Procedure(s) (LRB): COLONOSCOPY WITH PROPOFOL (N/A) POLYPECTOMY (N/A)  Patient location during evaluation: PACU Anesthesia Type: MAC Level of consciousness: awake and alert Pain management: pain level controlled Vital Signs Assessment: post-procedure vital signs reviewed and stable Respiratory status: spontaneous breathing, nonlabored ventilation, respiratory function stable and patient connected to nasal cannula oxygen Cardiovascular status: stable and blood pressure returned to baseline Anesthetic complications: no    Severina Sykora

## 2015-11-17 NOTE — Anesthesia Procedure Notes (Signed)
Procedure Name: MAC Performed by: Shawnta Zimbelman Pre-anesthesia Checklist: Patient identified, Emergency Drugs available, Suction available, Timeout performed and Patient being monitored Patient Re-evaluated:Patient Re-evaluated prior to inductionOxygen Delivery Method: Nasal cannula Placement Confirmation: positive ETCO2     

## 2015-11-20 ENCOUNTER — Encounter: Payer: Self-pay | Admitting: Gastroenterology

## 2015-11-20 ENCOUNTER — Ambulatory Visit (INDEPENDENT_AMBULATORY_CARE_PROVIDER_SITE_OTHER): Payer: Managed Care, Other (non HMO) | Admitting: Family Medicine

## 2015-11-20 DIAGNOSIS — B351 Tinea unguium: Secondary | ICD-10-CM | POA: Diagnosis not present

## 2015-11-20 MED ORDER — TERBINAFINE HCL 250 MG PO TABS: 250.0000 mg | ORAL_TABLET | Freq: Every day | ORAL | 2 refills | Status: DC

## 2015-11-20 NOTE — Assessment & Plan Note (Signed)
Will start Lamisil patient education given reviewed lab work done previously with normal liver function

## 2015-11-20 NOTE — Progress Notes (Signed)
BP 138/79 (BP Location: Left Arm, Patient Position: Sitting, Cuff Size: Normal)   Pulse 75   Temp 98 F (36.7 C)   Ht 5' 8.7" (1.745 m)   Wt 218 lb (98.9 kg)   SpO2 99%   BMI 32.47 kg/m    Subjective:    Patient ID: Mario [name], male    DOB: 10/06/1960, 55 y.o.   MRN: [id number]  HPI: Mario [name] is a 55 y.o. male  Chief Complaint  Patient presents with  . Nail Problem  Patient with changes of onychomycosis has been ongoing since he was 55 years old pulling tobacco and loss both toenails Wears boots most the time otherwise doing well no complaints Blood pressure doing well with good control Takes Zetia without problems And Plavix without problems.  Relevant past medical, surgical, family and social history reviewed and updated as indicated. Interim medical history since our last visit reviewed. Allergies and medications reviewed and updated.  Review of Systems  Constitutional: Negative.   Respiratory: Negative.   Cardiovascular: Negative.     Per HPI unless specifically indicated above     Objective:    BP 138/79 (BP Location: Left Arm, Patient Position: Sitting, Cuff Size: Normal)   Pulse 75   Temp 98 F (36.7 C)   Ht 5' 8.7" (1.745 m)   Wt 218 lb (98.9 kg)   SpO2 99%   BMI 32.47 kg/m   Wt Readings from Last 3 Encounters:  11/20/15 218 lb (98.9 kg)  11/17/15 210 lb (95.3 kg)  11/06/15 221 lb (100.2 kg)    Physical Exam  Constitutional: He is oriented to person, place, and time. He appears well-developed and well-nourished. No distress.  HENT:  Head: Normocephalic and atraumatic.  Right Ear: Hearing normal.  Left Ear: Hearing normal.  Nose: Nose normal.  Eyes: Conjunctivae and lids are normal. Right eye exhibits no discharge. Left eye exhibits no discharge. No scleral icterus.  Cardiovascular: Normal rate, regular rhythm and normal heart sounds.   Pulmonary/Chest: Effort normal. No respiratory distress.  Musculoskeletal: Normal range of motion.    Neurological: He is alert and oriented to person, place, and time.  Skin: Skin is intact. No rash noted.  Classic changes of onychomycosis both toenails great  Psychiatric: He has a normal mood and affect. His speech is normal and behavior is normal. Judgment and thought content normal. Cognition and memory are normal.    Results for orders placed or performed in visit on 10/09/15  LP+ALT+AST Piccolo, [employer]  Result Value Ref Range   ALT (SGPT) Piccolo, Waived 27 10 - 47 U/L   AST (SGOT) Piccolo, Waived 35 11 - 38 U/L   Cholesterol Piccolo, Waived 217 (H) <200 mg/dL   HDL Chol Piccolo, Waived 76 >59 mg/dL   Triglycerides Piccolo,Waived 207 (H) <150 mg/dL   Chol/HDL Ratio Piccolo,Waive 2.9 mg/dL   LDL Chol Calc Piccolo Waived 100 (H) <100 mg/dL   VLDL Chol Calc Piccolo,Waive 41 (H) <30 mg/dL  Basic metabolic panel  Result Value Ref Range   Glucose 93 65 - 99 mg/dL   BUN 17 6 - 24 mg/dL   Creatinine, Ser 1.03 0.76 - 1.27 mg/dL   GFR calc non Af Amer 83 >59 mL/min/1.73   GFR calc Af Amer 96 >59 mL/min/1.73   BUN/Creatinine Ratio 17 9 - 20   Sodium 141 134 - 144 mmol/L   Potassium 4.4 3.5 - 5.2 mmol/L   Chloride 101 96 - 106 mmol/L  CO2 19 18 - 29 mmol/L   Calcium 9.3 8.7 - 10.2 mg/dL      Assessment & Plan:   Problem List Items Addressed This Visit      Musculoskeletal and Integument   Onychomycosis    Will start Lamisil patient education given reviewed lab work done previously with normal liver function      Relevant Medications   terbinafine (LAMISIL) 250 MG tablet    Other Visit Diagnoses   None.      Follow up plan: Return for Physical Exam.

## 2015-11-21 ENCOUNTER — Encounter: Payer: Self-pay | Admitting: Gastroenterology

## 2015-11-22 ENCOUNTER — Encounter: Payer: Self-pay | Admitting: Gastroenterology

## 2015-11-27 ENCOUNTER — Telehealth: Payer: Self-pay

## 2015-11-27 ENCOUNTER — Other Ambulatory Visit: Payer: Self-pay | Admitting: Family Medicine

## 2015-11-27 ENCOUNTER — Other Ambulatory Visit: Payer: Self-pay

## 2015-11-27 MED ORDER — HYDROCHLOROTHIAZIDE 12.5 MG PO TABS: 12.5000 mg | ORAL_TABLET | Freq: Every day | ORAL | 2 refills | Status: DC

## 2015-11-27 MED ORDER — BENAZEPRIL HCL 40 MG PO TABS: 40.0000 mg | ORAL_TABLET | Freq: Every day | ORAL | 3 refills | Status: DC

## 2015-11-27 NOTE — Telephone Encounter (Signed)
Pt called stated he has switched back to his old blood pressure medication. Stated the new medication makes him cough, super tired, he is having muscle cramps, and is nauseated. Thanks.

## 2015-11-27 NOTE — Telephone Encounter (Signed)
Phone call Discussed with patient having side effects feels that Diovan is too strong wants to stop has gone back on Benzapril feels a little better but his blood pressures too high will add hydrochlorothiazide 12.5 mg to Benzapril 40 one a day each recheck blood pressure in about a month will need BMP also

## 2015-11-27 NOTE — Telephone Encounter (Signed)
Patient feels he cannot take new BP medication

## 2016-01-05 ENCOUNTER — Encounter: Payer: Self-pay | Admitting: Family Medicine

## 2016-01-29 ENCOUNTER — Encounter: Payer: Self-pay | Admitting: Family Medicine

## 2016-01-29 ENCOUNTER — Ambulatory Visit (INDEPENDENT_AMBULATORY_CARE_PROVIDER_SITE_OTHER): Payer: Managed Care, Other (non HMO) | Admitting: Family Medicine

## 2016-01-29 VITALS — BP 137/89 | HR 69 | Temp 98.2°F | Ht 68.3 in | Wt 216.8 lb

## 2016-01-29 DIAGNOSIS — M19042 Primary osteoarthritis, left hand: Secondary | ICD-10-CM

## 2016-01-29 DIAGNOSIS — I10 Essential (primary) hypertension: Secondary | ICD-10-CM

## 2016-01-29 DIAGNOSIS — Z0000 Encounter for general adult medical examination without abnormal findings: Secondary | ICD-10-CM | POA: Diagnosis not present

## 2016-01-29 DIAGNOSIS — M19041 Primary osteoarthritis, right hand: Secondary | ICD-10-CM | POA: Diagnosis not present

## 2016-01-29 DIAGNOSIS — E7800 Pure hypercholesterolemia, unspecified: Secondary | ICD-10-CM | POA: Diagnosis not present

## 2016-01-29 DIAGNOSIS — G4733 Obstructive sleep apnea (adult) (pediatric): Secondary | ICD-10-CM | POA: Diagnosis not present

## 2016-01-29 DIAGNOSIS — M1990 Unspecified osteoarthritis, unspecified site: Secondary | ICD-10-CM

## 2016-01-29 MED ORDER — BENAZEPRIL HCL 40 MG PO TABS
40.0000 mg | ORAL_TABLET | Freq: Every day | ORAL | 4 refills | Status: DC
Start: 2016-01-29 — End: 2016-06-21

## 2016-01-29 MED ORDER — MELOXICAM 15 MG PO TABS: 15.0000 mg | ORAL_TABLET | Freq: Every day | ORAL | 3 refills | Status: DC

## 2016-01-29 MED ORDER — EZETIMIBE 10 MG PO TABS: 10.0000 mg | ORAL_TABLET | Freq: Every day | ORAL | 4 refills | Status: DC

## 2016-01-29 MED ORDER — HYDROCHLOROTHIAZIDE 12.5 MG PO TABS: 12.5000 mg | ORAL_TABLET | Freq: Every day | ORAL | 4 refills | Status: DC

## 2016-01-29 MED ORDER — CLOPIDOGREL BISULFATE 75 MG PO TABS: 75.0000 mg | ORAL_TABLET | Freq: Every day | ORAL | 4 refills | Status: DC

## 2016-01-29 NOTE — Assessment & Plan Note (Signed)
Patient with lateral hand arthritis changes possibly coming from either on a nerve from elbow arthritis or either carpal will observe for symptoms and notify us and we will also try meloxicam to see if makes a difference if needed we will do orthopedic referral to further evaluate symptoms.

## 2016-01-29 NOTE — Progress Notes (Signed)
BP 137/89 (BP Location: Left Arm, Patient Position: Sitting, Cuff Size: Normal)   Pulse 69   Temp 98.2 F (36.8 C)   Ht 5' 8.3" (1.735 m)   Wt 216 lb 12.8 oz (98.3 kg)   SpO2 97%   BMI 32.68 kg/m    Subjective:    Patient ID: Mario [name], male    DOB: 1961-02-25, 55 y.o.   MRN: [id number]  HPI: Mario [name] is a 55 y.o. male  Chief Complaint  Patient presents with  . 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 Patient's also had carpal tunnel in the past and symptoms bother him primarily at night which are more carpal tunnel like symptoms. Patient will observe symptoms will also try some meloxicam. Reviewed medications blood pressure doing okay no issues Takes Zetia for cholesterol doing well And Plavix and aspirin no real bruising issues or concerns with bleeding.  Relevant past medical, surgical, family and social history reviewed and updated as indicated. Interim medical history since our last visit reviewed. Allergies and medications reviewed and updated.  Review of Systems  Constitutional: Negative.   HENT: Negative.   Eyes: Negative.   Respiratory: Negative.   Cardiovascular: Negative.   Gastrointestinal: Negative.   Endocrine: Negative.   Genitourinary: Negative.   Musculoskeletal: Negative.   Skin: Negative.   Allergic/Immunologic: Negative.   Neurological: Negative.   Hematological: Negative.   Psychiatric/Behavioral: Negative.     Per HPI unless specifically indicated above     Objective:    BP 137/89 (BP Location: Left Arm, Patient Position: Sitting, Cuff Size: Normal)   Pulse 69   Temp 98.2 F (36.8 C)   Ht 5' 8.3" (1.735 m)   Wt 216 lb 12.8 oz (98.3 kg)   SpO2 97%   BMI 32.68 kg/m   Wt  Readings from Last 3 Encounters:  01/29/16 216 lb 12.8 oz (98.3 kg)  11/20/15 218 lb (98.9 kg)  11/17/15 210 lb (95.3 kg)    Physical Exam  Constitutional: He is oriented to person, place, and time. He appears well-developed and well-nourished.  HENT:  Head: Normocephalic and atraumatic.  Right Ear: External ear normal.  Left Ear: External ear normal.  Nose: Nose normal.  Eyes: Conjunctivae and EOM are normal. Pupils are equal, round, and reactive to light.  Neck: Normal range of motion. Neck supple. No thyromegaly present.  Cardiovascular: Normal rate, regular rhythm, normal heart sounds and intact distal pulses.   Pulmonary/Chest: Effort normal and breath sounds normal.  Abdominal: Soft. Bowel sounds are normal. There is no splenomegaly or hepatomegaly.  Genitourinary: Penis normal.  Genitourinary Comments: GU deferred as patient just had colonoscopy with normal exam.  Musculoskeletal: Normal range of motion.  Lymphadenopathy:    He has no cervical adenopathy.  Neurological: He is alert and oriented to person, place, and time. He has normal reflexes.  Skin: Skin is warm and dry. No rash noted. No erythema.  Psychiatric: He has a normal mood and affect. His behavior is normal. Judgment and thought content normal.    Results for orders placed or performed in visit on 10/09/15  LP+ALT+AST Piccolo, [employer]  Result Value Ref Range   ALT (SGPT) Piccolo, Waived 27 10 - 47 U/L   AST (SGOT) Piccolo, Waived 35  11 - 38 U/L   Cholesterol Piccolo, Waived 217 (H) <200 mg/dL   HDL Chol Piccolo, Waived 76 >59 mg/dL   Triglycerides Piccolo,Waived 207 (H) <150 mg/dL   Chol/HDL Ratio Piccolo,Waive 2.9 mg/dL   LDL Chol Calc Piccolo Waived 100 (H) <100 mg/dL   VLDL Chol Calc Piccolo,Waive 41 (H) <30 mg/dL  Basic metabolic panel  Result Value Ref Range   Glucose 93 65 - 99 mg/dL   BUN 17 6 - 24 mg/dL   Creatinine, Ser 1.03 0.76 - 1.27 mg/dL   GFR calc non Af Amer 83 >59 mL/min/1.73   GFR calc  Af Amer 96 >59 mL/min/1.73   BUN/Creatinine Ratio 17 9 - 20   Sodium 141 134 - 144 mmol/L   Potassium 4.4 3.5 - 5.2 mmol/L   Chloride 101 96 - 106 mmol/L   CO2 19 18 - 29 mmol/L   Calcium 9.3 8.7 - 10.2 mg/dL      Assessment & Plan:   Problem List Items Addressed This Visit      Cardiovascular and Mediastinum   HTN (hypertension)    The current medical regimen is effective;  continue present plan and medications.       Relevant Medications   hydrochlorothiazide (HYDRODIURIL) 12.5 MG tablet   ezetimibe (ZETIA) 10 MG tablet   benazepril (LOTENSIN) 40 MG tablet     Respiratory   Sleep apnea    The current medical regimen is effective;  continue present plan and medications.         Musculoskeletal and Integument   Arthritis    Patient with lateral hand arthritis changes possibly coming from either on a nerve from elbow arthritis or either carpal will observe for symptoms and notify us and we will also try meloxicam to see if makes a difference if needed we will do orthopedic referral to further evaluate symptoms.      Relevant Medications   meloxicam (MOBIC) 15 MG tablet     Other   Hyperlipidemia    The current medical regimen is effective;  continue present plan and medications.       Relevant Medications   hydrochlorothiazide (HYDRODIURIL) 12.5 MG tablet   ezetimibe (ZETIA) 10 MG tablet   benazepril (LOTENSIN) 40 MG tablet    Other Visit Diagnoses    Annual physical exam    -  Primary   Relevant Orders   CBC with Differential/Platelet   Comprehensive metabolic panel   Lipid panel   PSA   TSH   Urinalysis, Routine w reflex microscopic (not at [facility])       Follow up plan: Return in about 6 months (around 07/29/2016) for BMP,  Lipids, ALT, AST.

## 2016-01-29 NOTE — Assessment & Plan Note (Signed)
The current medical regimen is effective;  continue present plan and medications.  

## 2016-01-30 ENCOUNTER — Encounter: Payer: Self-pay | Admitting: Family Medicine

## 2016-01-30 LAB — URINALYSIS, ROUTINE W REFLEX MICROSCOPIC
Bilirubin, UA: NEGATIVE
Glucose, UA: NEGATIVE
KETONES UA: NEGATIVE
LEUKOCYTES UA: NEGATIVE
NITRITE UA: NEGATIVE
PH UA: 5.5 (ref 5.0–7.5)
Protein, UA: NEGATIVE
Specific Gravity, UA: 1.015 (ref 1.005–1.030)
Urobilinogen, Ur: 0.2 mg/dL (ref 0.2–1.0)

## 2016-01-30 LAB — COMPREHENSIVE METABOLIC PANEL
ALBUMIN: 4.6 g/dL (ref 3.5–5.5)
ALK PHOS: 77 IU/L (ref 39–117)
ALT: 20 IU/L (ref 0–44)
AST: 19 IU/L (ref 0–40)
Albumin/Globulin Ratio: 2 (ref 1.2–2.2)
BILIRUBIN TOTAL: 0.4 mg/dL (ref 0.0–1.2)
BUN / CREAT RATIO: 15 (ref 9–20)
BUN: 14 mg/dL (ref 6–24)
CHLORIDE: 101 mmol/L (ref 96–106)
CO2: 22 mmol/L (ref 18–29)
Calcium: 9.5 mg/dL (ref 8.7–10.2)
Creatinine, Ser: 0.93 mg/dL (ref 0.76–1.27)
GFR calc non Af Amer: 94 mL/min/{1.73_m2} (ref >59)
GFR, EST AFRICAN AMERICAN: 109 mL/min/{1.73_m2} (ref >59)
GLUCOSE: 85 mg/dL (ref 65–99)
Globulin, Total: 2.3 g/dL (ref 1.5–4.5)
POTASSIUM: 4.3 mmol/L (ref 3.5–5.2)
Sodium: 142 mmol/L (ref 134–144)
TOTAL PROTEIN: 6.9 g/dL (ref 6.0–8.5)

## 2016-01-30 LAB — CBC WITH DIFFERENTIAL/PLATELET
BASOS ABS: 0 10*3/uL (ref 0.0–0.2)
Basos: 0 %
EOS (ABSOLUTE): 0.3 10*3/uL (ref 0.0–0.4)
Eos: 4 %
HEMOGLOBIN: 15.4 g/dL (ref 12.6–17.7)
Hematocrit: 44.2 % (ref 37.5–51.0)
IMMATURE GRANS (ABS): 0 10*3/uL (ref 0.0–0.1)
IMMATURE GRANULOCYTES: 0 %
LYMPHS: 27 %
Lymphocytes Absolute: 2.1 10*3/uL (ref 0.7–3.1)
MCH: 31.3 pg (ref 26.6–33.0)
MCHC: 34.8 g/dL (ref 31.5–35.7)
MCV: 90 fL (ref 79–97)
MONOCYTES: 9 %
Monocytes Absolute: 0.7 10*3/uL (ref 0.1–0.9)
NEUTROS PCT: 60 %
Neutrophils Absolute: 4.6 10*3/uL (ref 1.4–7.0)
PLATELETS: 227 10*3/uL (ref 150–379)
RBC: 4.92 x10E6/uL (ref 4.14–5.80)
RDW: 14.2 % (ref 12.3–15.4)
WBC: 7.7 10*3/uL (ref 3.4–10.8)

## 2016-01-30 LAB — TSH: TSH: 1.07 u[IU]/mL (ref 0.450–4.500)

## 2016-01-30 LAB — MICROSCOPIC EXAMINATION: WBC, UA: NONE SEEN /hpf (ref 0–?)

## 2016-01-30 LAB — LIPID PANEL
CHOLESTEROL TOTAL: 218 mg/dL — AB (ref 100–199)
Chol/HDL Ratio: 2.9 ratio units (ref 0.0–5.0)
HDL: 75 mg/dL (ref >39)
LDL Calculated: 111 mg/dL — ABNORMAL HIGH (ref 0–99)
Triglycerides: 159 mg/dL — ABNORMAL HIGH (ref 0–149)
VLDL Cholesterol Cal: 32 mg/dL (ref 5–40)

## 2016-01-30 LAB — PSA: Prostate Specific Ag, Serum: 0.8 ng/mL (ref 0.0–4.0)

## 2016-02-15 ENCOUNTER — Encounter: Payer: Managed Care, Other (non HMO) | Admitting: Family Medicine

## 2016-03-29 ENCOUNTER — Ambulatory Visit: Payer: Self-pay | Admitting: Physician Assistant

## 2016-03-29 VITALS — BP 130/90 | HR 72 | Temp 98.4°F

## 2016-03-29 DIAGNOSIS — J069 Acute upper respiratory infection, unspecified: Secondary | ICD-10-CM

## 2016-03-29 MED ORDER — AZITHROMYCIN 250 MG PO TABS: ORAL_TABLET | ORAL | 0 refills | Status: DC

## 2016-03-29 NOTE — Progress Notes (Signed)
Pt left without being seen as provider was not here yet, told rma that had green mucus and cough/congestion, no fever/; was given amoxil by his pcp which didn't work, sent rx for zpack to [address]

## 2016-06-07 ENCOUNTER — Telehealth: Payer: Self-pay

## 2016-06-07 NOTE — Telephone Encounter (Signed)
Pt called back. [facility] had notified him. He did check his lot numbers, did not have any of the lot numbers in stock.

## 2016-06-07 NOTE — Telephone Encounter (Signed)
Attempted to reach patient to verify that [facility] did contact him about the recall on Plavix 75 mg. Vm was not set up could not leave a vm. Will attempt again on Monday. Will scan report.

## 2016-06-21 ENCOUNTER — Encounter: Payer: Self-pay | Admitting: Cardiovascular Disease

## 2016-06-21 ENCOUNTER — Telehealth: Payer: Self-pay | Admitting: Cardiovascular Disease

## 2016-06-21 MED ORDER — VALSARTAN 160 MG PO TABS: 80.0000 mg | ORAL_TABLET | Freq: Every day | ORAL | 3 refills | Status: DC

## 2016-06-21 NOTE — Telephone Encounter (Signed)
Pt c/o BP issue: STAT if pt c/o blurred vision, one-sided weakness or slurred speech  1. What are your last 5 BP readings? 156/101, 158/104,172/115  2. Are you having any other symptoms (ex. Dizziness, headache, blurred vision, passed out)? Lightheaded on last night   3. What is your BP issue? Its elevated

## 2016-06-21 NOTE — Telephone Encounter (Signed)
Pt checked BP yesterday afternoon and it was 172/115.  Checked it again after getting home and it was still high  Took Benazepril 40 and HCTZ 12.5 last night.  BP this morning at 0500 was 158/104.  Pt took another Benazepril 40 at that time and BP about an hour ago was 152/101.  Was lightheaded last night but is feeling fine this morning.  Denies HA, SOB, CP.  Advised I will send message to Dr. Acie Fredrickson and his nurse. Pt appreciative for call.

## 2016-06-21 NOTE — Telephone Encounter (Signed)
Spoke with patient about elevated BP. He states BP currently is 128/80 mmHg. He states he does not know what caused BP to be so high yesterday. He denies eating high salt diet, pain, infection or other symptoms that could cause BP to be elevated. He states his wife used the same BP monitor on herself and got an accurate reading so he believes the cuff is working appropriately. I asked him about stopping valsartan 160 mg that was prescribed by Dr. Acie Fredrickson July 2017. He states he cannot remember the reason he did not like taking it, but he is willing to restart if Dr. Acie Fredrickson thinks it is a better medication. I reviewed with Dr. Acie Fredrickson and advised him to stop benazepril, continue HCTZ, and start valsartan 80 mg (1/2 tab) and to monitor BP for the next week or so. He has an appointment on 3/12 with Dr. Acie Fredrickson and will bring BP readings to that appointment. I advised him to call back sooner if BP does not improve. He verbalized understanding and agreement with the plan and thanked me for the call.

## 2016-06-21 NOTE — Telephone Encounter (Signed)
I would recommend that he restart the Valsartan at 80 mg a day Continue HCTZ 12.5 mg a day  Have BMP checked 2-3 weeks later.

## 2016-06-22 ENCOUNTER — Telehealth: Payer: Self-pay | Admitting: Internal Medicine

## 2016-06-22 NOTE — Telephone Encounter (Signed)
Patient had elevated BP on Friday of 197/108. He was started on valsartan. Patient has been giving him her own xanax which appears to be bringing his blood pressure down. Wife was told that it is not recommended to give him xanax. However she can give him extra dose of valsartan tonight and start 160 mg valsartan and see how he does.

## 2016-06-24 NOTE — Telephone Encounter (Signed)
Agree with increasing the valsartan to 160 mg and follow BP.

## 2016-06-26 ENCOUNTER — Telehealth: Payer: Self-pay | Admitting: Cardiovascular Disease

## 2016-06-26 NOTE — Telephone Encounter (Signed)
Close encounter 

## 2016-06-27 ENCOUNTER — Telehealth: Payer: Self-pay | Admitting: Cardiovascular Disease

## 2016-06-27 NOTE — Telephone Encounter (Signed)
Spoke with patient's wife who states she is very concerned about patient's BP and thinks he may have had a stroke. She states that on last Thursday evening, she came home from work and patient was confused, dizzy, and had difficult walking. She thought he had been drinking alcohol but then realized he was not. She took him to their daughter's house who is a Marine scientist and patient's BP was 200/128. She states the patient did not want to go to the hospital so she took him home and he went to bed. On Friday morning, he called our office to c/o high BP but did not tell us about the episode the night before. Dr. Acie Fredrickson prescribed Valsartan 80 mg QD. Patient's wife called the on-call provider over the weekend (see telephone encounter from Dr. Eula Fried on 3/3) and was advised to increase Valsartan to 160 mg QD. Wife states BP remains elevated despite increased dose of medication and patient has facial flushing, and burst blood vessels in eyes. She states patient requests Dr. Elmarie Shiley advice because PCP is not in the office very frequently. She states when BP is very elevated, the patient exhibits signs that she thinks indicate stroke. I advised that I will review with Dr. Acie Fredrickson for advice and call her back. She was very appreciative of the call.

## 2016-06-27 NOTE — Telephone Encounter (Signed)
See previous telephone encounter dated 3/8

## 2016-06-27 NOTE — Telephone Encounter (Signed)
His ongoing symptoms are concerning I think he needs to come to the ER - either at [facility] or [facility] and be seen May need further neuro eval with this mental status changes.

## 2016-06-27 NOTE — Telephone Encounter (Signed)
New Message     Pt returning Michelles call about her husband

## 2016-06-27 NOTE — Telephone Encounter (Signed)
New Message  Pt wife voiced pt bp was high. Last Thur. it was 198/128 and pt was acting weird and feels like pt was having a stroke.   Pt wouldn't let her take him anywhere and called MD-Nahser Friday and did a med change.  Pts wife voiced Sharyn Lull was suppose to call yesterday and touch basis but she was off.   Pts wife voiced pt is very sleepy and his BP yesterday was 167/99.   Pts wife voiced she called MD on call last night but MD on call voiced he wasn't concerned with those numbers and he chuckled when they got off the phone.   Please f/u with pts wife

## 2016-06-27 NOTE — Telephone Encounter (Signed)
Left message for call back.

## 2016-06-27 NOTE — Telephone Encounter (Signed)
Left message for patient's wife with Dr. Elmarie Shiley advice. I advised her to call back with further questions or concerns.

## 2016-06-28 ENCOUNTER — Ambulatory Visit: Payer: Self-pay | Admitting: Family

## 2016-06-28 VITALS — BP 170/100 | HR 80 | Temp 98.6°F

## 2016-06-28 DIAGNOSIS — I10 Essential (primary) hypertension: Secondary | ICD-10-CM

## 2016-06-28 NOTE — Progress Notes (Signed)
S  / bp up several weeks, per cardiology he has   increased  diovan to max 160 mg daily  over the past week , systolic down 20 mm today per hx,hx of stents  Denies CP, SOB ,HA , blurry vision, edema . O/  Alert , nad , Neck without bruits, Heart rsr o m or g, lungs clear,abd no meg,mass ,tenderness or bruits, EXT no edema   EKG sinus brady A/ HTN not controlled CAD  P / increase HCTZ 12.5 to 25 mg daily . Continue meds, rest at home . Seek urgent care for cardio/neuro sxs. Encouraged to wear CPAP daily.

## 2016-07-01 ENCOUNTER — Ambulatory Visit (INDEPENDENT_AMBULATORY_CARE_PROVIDER_SITE_OTHER): Payer: Managed Care, Other (non HMO) | Admitting: Cardiovascular Disease

## 2016-07-01 ENCOUNTER — Encounter: Payer: Self-pay | Admitting: Cardiovascular Disease

## 2016-07-01 VITALS — BP 132/88 | HR 67 | Ht 68.0 in | Wt 227.0 lb

## 2016-07-01 DIAGNOSIS — E785 Hyperlipidemia, unspecified: Secondary | ICD-10-CM | POA: Diagnosis not present

## 2016-07-01 DIAGNOSIS — Z79899 Other long term (current) drug therapy: Secondary | ICD-10-CM

## 2016-07-01 DIAGNOSIS — I10 Essential (primary) hypertension: Secondary | ICD-10-CM

## 2016-07-01 MED ORDER — ASPIRIN 81 MG PO TBEC: 325.0000 mg | DELAYED_RELEASE_TABLET | Freq: Every day | ORAL | Status: DC

## 2016-07-01 MED ORDER — VALSARTAN 160 MG PO TABS
160.0000 mg | ORAL_TABLET | Freq: Every day | ORAL | Status: DC
Start: 2016-07-01 — End: 2016-10-29

## 2016-07-01 MED ORDER — POTASSIUM CHLORIDE ER 10 MEQ PO TBCR: 10.0000 meq | EXTENDED_RELEASE_TABLET | Freq: Every day | ORAL | 3 refills | Status: DC

## 2016-07-01 MED ORDER — HYDROCHLOROTHIAZIDE 12.5 MG PO TABS: 25.0000 mg | ORAL_TABLET | Freq: Every day | ORAL | 4 refills | Status: DC

## 2016-07-01 MED ORDER — ASPIRIN 325 MG PO TBEC: 325.0000 mg | DELAYED_RELEASE_TABLET | Freq: Every day | ORAL | Status: DC

## 2016-07-01 NOTE — Progress Notes (Signed)
Mario [name] Date of Birth  [name] 21, 1961       [facility]     [address] [place]   [place], [place]  [address]    [phone]      Fax  [phone]      Problem List: 1.  Coronary artery disease-status post PTCA and stenting in 2004 2.  Hyperlipidemia 3. Hypertension     Mario [name] is a 56 yo with hx of CAD.,  He's had an upper respiratory tract viral infection for the past month or so.  He denies any chest pain.  He has been able to do of his normal activities without any significant problems. He works at the [employer] in [place].  [name]. 26, 2015: Mario [name] is doing well.  No angina.   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   BP has been elevated .  He has been tried on HCTZ in the past but it made him feel poorly.    August 17, 2013:  November 16, 2013: Bodies doing well from a cardiac standpoint. His medical doctor recently changed his Nexium to products because of the interaction and potential in addition of Plavix metabolism. He's noted that he's had lots more indigestion and heartburn since stopping the Nexium.  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 We discussed checking a P2 Y  12 assay with him on Plavix and Nexium to prove that he is still inhibited.  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 He notes that he bleeds quite readily if he cuts himself.  November 06, 2015: Mario [name] is seen after a 2 year absence. He has a history of coronary artery disease and hypertension BP has been elevated  Still eats some salty foods.  No CP,  Still very active   July 01, 2016:  Mario [name] has been having some problems with elevated blood pressure recently.   He had a question of some mental status changes March  1. .? TIA symptoms .  Slurring works, [name]  ,   BP was better  170/100. No further confusion .    Has gained 20 lbs over the winter.   Not getting any exercise.   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 clopidogrel (PLAVIX) 75 MG tablet Take 1 tablet (75 mg total) by mouth daily. 90 tablet 4  . ezetimibe (ZETIA) 10 MG tablet Take 1 tablet (10 mg total) by mouth daily. 90 tablet 4  . hydrochlorothiazide (HYDRODIURIL) 12.5 MG tablet Take 1 tablet (12.5 mg total) by mouth daily. 90 tablet 4   No current facility-administered medications on file prior to visit.     Allergies  Allergen Reactions  . Crestor [Rosuvastatin Calcium]     Joint pain  . Lipitor [Atorvastatin Calcium]     Joint pain  . Zocor [Simvastatin]     Joint pain    Past Medical History:  Diagnosis Date  . Arthritis   . CAD (coronary artery disease)   . HLD (hyperlipidemia)   . Recurrent chest pain   . Sleep apnea    cpap    Past Surgical History:  Procedure Laterality Date  .  COLONOSCOPY WITH PROPOFOL N/A 11/17/2015   Procedure: COLONOSCOPY WITH PROPOFOL;  Surgeon: Lucilla Lame, MD;  Location: [facility];  Service: Endoscopy;  Laterality: N/A;  CPAP  . NASAL SINUS SURGERY    . POLYPECTOMY N/A 11/17/2015   Procedure: POLYPECTOMY;  Surgeon: Lucilla Lame, MD;  Location: [facility];  Service: Endoscopy;  Laterality: N/A;  sigmoid polyp  . PTCA     3 stents    History  Smoking Status  . Former Smoker  . Quit date: 04/22/2001  Smokeless Tobacco  . Current User  . Types: Snuff    History  Alcohol Use  . 10.8 oz/week  . 18 Cans of beer per week    Family History  Problem Relation Age of Onset  . Hyperlipidemia Mother   . Hypertension Mother   . Heart disease Mother   . Heart failure Mother     42  . Hypertension Sister   . Hypertension Sister   . Coronary artery disease      Mario [name] of Systems:  Reviewed in the HPI.  All other  systems are negative.  Physical Exam: Blood pressure 132/88, pulse 67, height 5\' 8"  (1.727 m), weight 227 lb (103 kg). General: Well developed, well nourished, in no acute distress. Head: Normocephalic, atraumatic, sclera non-icteric, mucus membranes are moist,  Neck: Supple. Carotids are 2 + without bruits. No JVD  Lungs: Clear  Heart: RR, normal S1, S2 Abdomen: Soft, non-tender, non-distended with normal bowel sounds. Msk:  Strength and tone are normal  Extremities: No clubbing or cyanosis. No edema.  Distal pedal pulses are 2+ and equal  Neuro: CN II - XII intact.  Alert and oriented X 3.  Psych:  Normal   ECG: July 01, 2016:   NSR at 53.  Normal ECG    Assessment / Plan:   1. Essential HTN:  Mario [name] presents with a recent history of elevated blood pressure. He is not getting as much exercise as he used to. He also may be eating a little bit lower. He has gained 20 pounds of the LAD. We increased his valsartan and he seems to be doing a bit better. We'll also now increase the HCTZ to 25 mg a day and add potassium chloride 10 mEq a day. We will check a basic metabolic profile in 3 weeks.  He had some neurologic changes when his blood pressure was very high several weeks ago. We will check a carotid duplex scan. We'll refer him to neurology for further evaluation to make sure that this was not a TIA. It may have been related to his hypertension.  I'll see him in 3 months.  2. Possible  TIA: Mario [name] had some neurologic changes several weeks ago. These have resolved. We'll check a carotid duplex scan. Will have him see neurology for further workup.   3.  Coronary artery disease-status post PTCA and stenting in 2004 Mario [name] seems to be doing very well. He's not having any angina. He is still on Plavix.  Reduce ASA to 81 mg a day .   4.  Hyperlipidemia - his last LDL is 100. Continue Zetia. He does not tolerate the statins.   Will check lipids when I seen him in 3 months      I'll  see him again in  3 months     Mertie Moores, MD  07/01/2016 8:19 AM    [address],  [place], [place]  [address]  Pager [phone] Phone: [phone]; Fax: [phone]

## 2016-07-01 NOTE — Patient Instructions (Addendum)
Your physician has recommended you make the following change in your medication:  DECREASE  ASPIRIN TO  81 MG EVERY DAY   INCREASE  HCTZ  TO 25 MG  EVERY DAY  START  KCL 10 MEQ  EVERY DAY   Your physician recommends that you return for lab work in:  [facility] has requested that you have a carotid duplex. This test is an ultrasound of the carotid arteries in your neck. It looks at blood flow through these arteries that supply the brain with blood. Allow one hour for this exam. There are no restrictions or special instructions. Your physician recommends that you schedule a follow-up appointment in:   [facility] have been referred to [facility]

## 2016-07-03 ENCOUNTER — Other Ambulatory Visit: Payer: Self-pay | Admitting: Cardiovascular Disease

## 2016-07-03 ENCOUNTER — Ambulatory Visit (INDEPENDENT_AMBULATORY_CARE_PROVIDER_SITE_OTHER): Payer: Managed Care, Other (non HMO) | Admitting: Neurology

## 2016-07-03 ENCOUNTER — Encounter: Payer: Self-pay | Admitting: Neurology

## 2016-07-03 VITALS — BP 136/85 | HR 84 | Ht 68.0 in | Wt 225.0 lb

## 2016-07-03 DIAGNOSIS — G4733 Obstructive sleep apnea (adult) (pediatric): Secondary | ICD-10-CM | POA: Diagnosis not present

## 2016-07-03 DIAGNOSIS — I25119 Atherosclerotic heart disease of native coronary artery with unspecified angina pectoris: Secondary | ICD-10-CM

## 2016-07-03 DIAGNOSIS — G459 Transient cerebral ischemic attack, unspecified: Secondary | ICD-10-CM

## 2016-07-03 NOTE — Progress Notes (Signed)
PATIENT: Mario [name] DOB: 03-Jun-1960  Chief Complaint  Patient presents with  . Hyperlipidemia    Reports two weeks ago, his BP was 179/126 and he was slurring his words.  His speech returnd to normal within the hour .  He did not seek treatment during the event but followed up with his cardiologist.  Adjustments were made to his blood pressure medications and he has a carotid ultrasound scheduled on 07/16/16.  He has not had any further episodes.  [name] PCP    [name], MD  . Cardiology    Thayer Headings, MD - referring MD     HISTORICAL  [name] is a 56 years old right-handed male, seen in refer by  his cardiologist Dr. Wonda Cheng Nahser for one episode of elevated blood pressure, with slurred speech, initial evaluation was on July 03 2016, his primary care physician is Dr. [name].  He had history of coronary artery disease, hypertension, hyperlipidemia, status post PTCA and stenting in 2004, is on double antiplatelet agent Plavix plus aspirin 81 mg, he was not able to tolerate multiple statin treatment in the past due to joint pain, now taking zetia 10mg  daily. He has obstructive sleep apnea, supposed to use CPAP machine, admit not compliant with it. He still drinks couple beers a day, used to smoke, now snuff.  He presented with sudden onset slurred speech on June 20 2716, his wife noticed he has slurred speech, last about one hour, was noted to have elevated blood pressure 179/126, he denies confusion, no sensory loss, no limb muscle weakness.   US Carotid is pending on March 26th 2018  I reviewed laboratory evaluation on January 29 2016, elevated cholesterol 218, LDL 111, normal TSH, CMP, with creatinine 0.93, CBC, hemoglobin 15.4,  REVIEW OF SYSTEMS: Full 14 system review of systems performed and notable only for fatigue, ringing ears, easy bruising, easy bleeding, thing for note, joint pain  ALLERGIES: Allergies  Allergen Reactions  . Crestor  [Rosuvastatin Calcium]     Joint pain  . Lipitor [Atorvastatin Calcium]     Joint pain  . Zocor [Simvastatin]     Joint pain    HOME MEDICATIONS: Current Outpatient Prescriptions  Medication Sig Dispense Refill  . aspirin 81 MG EC tablet Take 4 tablets (325 mg total) by mouth daily.    . clopidogrel (PLAVIX) 75 MG tablet Take 1 tablet (75 mg total) by mouth daily. 90 tablet 4  . ezetimibe (ZETIA) 10 MG tablet Take 1 tablet (10 mg total) by mouth daily. 90 tablet 4  . hydrochlorothiazide (HYDRODIURIL) 12.5 MG tablet Take 2 tablets (25 mg total) by mouth daily. 90 tablet 4  . potassium chloride (K-DUR) 10 MEQ tablet Take 1 tablet (10 mEq total) by mouth daily. 90 tablet 3  . valsartan (DIOVAN) 160 MG tablet Take 1 tablet (160 mg total) by mouth daily.     No current facility-administered medications for this visit.     PAST MEDICAL HISTORY: Past Medical History:  Diagnosis Date  . Arthritis   . CAD (coronary artery disease)   . HLD (hyperlipidemia)   . Recurrent chest pain   . Sleep apnea    cpap    PAST SURGICAL HISTORY: Past Surgical History:  Procedure Laterality Date  . COLONOSCOPY WITH PROPOFOL N/A 11/17/2015   Procedure: COLONOSCOPY WITH PROPOFOL;  Surgeon: Lucilla Lame, MD;  Location: [facility];  Service: Endoscopy;  Laterality: N/A;  CPAP  .  NASAL SINUS SURGERY    . POLYPECTOMY N/A 11/17/2015   Procedure: POLYPECTOMY;  Surgeon: Lucilla Lame, MD;  Location: [facility];  Service: Endoscopy;  Laterality: N/A;  sigmoid polyp  . PTCA     3 stents    FAMILY HISTORY: Family History  Problem Relation Age of Onset  . Hyperlipidemia Mother   . Hypertension Mother   . Heart disease Mother   . Heart failure Mother     47  . Lung cancer Mother   . Leukemia Father   . Hypertension Sister   . Hypertension Sister   . Coronary artery disease      SOCIAL HISTORY:  Social History   Social History  . Marital status: Married    Spouse name: N/A  .  Number of children: 3  . Years of education: 12   Occupational History  . 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   Social History Main Topics  . Smoking status: Former Smoker    Quit date: 04/22/2001  . Smokeless tobacco: Current User    Types: Snuff  . Alcohol use Yes     Comment: 2 beers per day  . Drug use: No  . Sexual activity: Not on file   Other Topics Concern  . Not on file   Social History Narrative   Lives at home with his wife.   Right-handed.   No daily caffeine use.     PHYSICAL EXAM   Vitals:   07/03/16 1016  BP: 136/85  Pulse: 84  Weight: 225 lb (102.1 kg)  Height: 5\' 8"  (1.727 m)    Not recorded      Body mass index is 34.21 kg/m.  PHYSICAL EXAMNIATION:  Gen: NAD, conversant, well nourised, obese, well groomed                     Cardiovascular: Regular rate rhythm, no peripheral edema, warm, nontender. Eyes: Conjunctivae clear without exudates or hemorrhage Neck: Supple, no carotid bruits. Pulmonary: Clear to auscultation bilaterally   NEUROLOGICAL EXAM:  MENTAL STATUS: Speech:    Speech is normal; fluent and spontaneous with normal comprehension.  Cognition:     Orientation to time, place and person     Normal recent and remote memory     Normal Attention span and concentration     Normal Language, naming, repeating,spontaneous speech     Fund of knowledge   CRANIAL NERVES: CN II: Visual fields are full to confrontation. Fundoscopic exam is normal with sharp discs and no vascular changes. Pupils are round equal and briskly reactive to light. CN III, IV, VI: extraocular movement are normal. No ptosis. CN V: Facial sensation is intact to pinprick in all 3 divisions bilaterally. Corneal responses are intact.  CN VII: Face is symmetric with normal eye closure and smile. CN VIII: Hearing is normal to rubbing fingers CN IX, X: Palate elevates symmetrically. Phonation is normal. CN XI: Head turning and shoulder shrug are intact CN XII:  Tongue is midline with normal movements and no atrophy.  MOTOR: There is no pronator drift of out-stretched arms. Muscle bulk and tone are normal. Muscle strength is normal.  REFLEXES: Reflexes are 2+ and symmetric at the biceps, triceps, knees, and ankles. Plantar responses are flexor.  SENSORY: Intact to light touch, pinprick, positional sensation and vibratory sensation are intact in fingers and toes.  COORDINATION: Rapid alternating movements and fine finger movements are intact. There is no dysmetria on finger-to-nose and heel-knee-shin.  GAIT/STANCE: Posture is normal. Gait is steady with normal steps, base, arm swing, and turning. Heel and toe walking are normal. Tandem gait is normal.  Romberg is absent.   DIAGNOSTIC DATA (LABS, IMAGING, TESTING) - I reviewed patient records, labs, notes, testing and imaging myself where available.   ASSESSMENT AND PLAN  PATRIC BUCKHALTER is a 56 y.o. male   Transient slurred speech  Probable TIA  He does has multiple vascular risk factor, male, smoking, drinking, hypertension, hyperlipidemia, coronary artery disease,  Keep aspirin, Plavix,  MRI of brain  Ultrasound of carotid arteries pending   Marcial Pacas, M.D. Ph.D.  [facility] [address] [place], [place] [address] Ph: [phone] Fax: [phone]  CC: Thayer Headings, MD, [name], MD

## 2016-07-15 ENCOUNTER — Ambulatory Visit
Admission: RE | Admit: 2016-07-15 | Discharge: 2016-07-15 | Disposition: A | Discharge status: Home / Self Care | Payer: Managed Care, Other (non HMO) | Source: Ambulatory Visit | Attending: Neurology | Admitting: Neurology

## 2016-07-15 DIAGNOSIS — G4733 Obstructive sleep apnea (adult) (pediatric): Secondary | ICD-10-CM

## 2016-07-15 DIAGNOSIS — G459 Transient cerebral ischemic attack, unspecified: Secondary | ICD-10-CM

## 2016-07-15 DIAGNOSIS — I25119 Atherosclerotic heart disease of native coronary artery with unspecified angina pectoris: Secondary | ICD-10-CM

## 2016-07-16 ENCOUNTER — Ambulatory Visit (HOSPITAL_COMMUNITY)
Admission: RE | Admit: 2016-07-16 | Discharge: 2016-07-16 | Disposition: A | Discharge status: Home / Self Care | Payer: Managed Care, Other (non HMO) | Source: Ambulatory Visit | Attending: Cardiovascular Disease | Admitting: Cardiovascular Disease

## 2016-07-16 DIAGNOSIS — G459 Transient cerebral ischemic attack, unspecified: Secondary | ICD-10-CM | POA: Diagnosis not present

## 2016-07-16 DIAGNOSIS — I6523 Occlusion and stenosis of bilateral carotid arteries: Secondary | ICD-10-CM | POA: Insufficient documentation

## 2016-07-16 DIAGNOSIS — Z87891 Personal history of nicotine dependence: Secondary | ICD-10-CM | POA: Diagnosis not present

## 2016-07-16 DIAGNOSIS — I10 Essential (primary) hypertension: Secondary | ICD-10-CM | POA: Insufficient documentation

## 2016-07-16 DIAGNOSIS — I2510 Atherosclerotic heart disease of native coronary artery without angina pectoris: Secondary | ICD-10-CM | POA: Insufficient documentation

## 2016-07-16 DIAGNOSIS — E785 Hyperlipidemia, unspecified: Secondary | ICD-10-CM | POA: Diagnosis not present

## 2016-07-17 ENCOUNTER — Telehealth: Payer: Self-pay | Admitting: Cardiovascular Disease

## 2016-07-17 DIAGNOSIS — I2510 Atherosclerotic heart disease of native coronary artery without angina pectoris: Secondary | ICD-10-CM

## 2016-07-17 NOTE — Telephone Encounter (Signed)
Reviewed results of carotid duplex with patient. He states he is scheduled for lab work on Monday at our office and would like to get this done at his office in [place] where they offer lab testing. I reviewed tests needed - cmet and lipid profile. He states he will give that information to the PA on duty and will call back with questions or concerns. He verbalized understanding and agreement with plan of care to repeat carotid u/s in 1 year. He thanked me for the call.

## 2016-07-17 NOTE — Telephone Encounter (Signed)
New message    Pt is returning Michelle's for carotid results

## 2016-07-22 ENCOUNTER — Other Ambulatory Visit: Payer: Self-pay

## 2016-08-05 NOTE — Addendum Note (Signed)
Addended by: Emmaline Life on: 08/05/2016 10:48 AM   Modules accepted: Orders

## 2016-08-06 ENCOUNTER — Other Ambulatory Visit: Payer: Managed Care, Other (non HMO)

## 2016-08-06 DIAGNOSIS — I2510 Atherosclerotic heart disease of native coronary artery without angina pectoris: Secondary | ICD-10-CM

## 2016-08-06 DIAGNOSIS — E785 Hyperlipidemia, unspecified: Secondary | ICD-10-CM

## 2016-08-06 LAB — COMPREHENSIVE METABOLIC PANEL
ALBUMIN: 4.7 g/dL (ref 3.5–5.5)
ALT: 34 IU/L (ref 0–44)
AST: 20 IU/L (ref 0–40)
Albumin/Globulin Ratio: 2.5 — ABNORMAL HIGH (ref 1.2–2.2)
Alkaline Phosphatase: 61 IU/L (ref 39–117)
BUN/Creatinine Ratio: 12 (ref 9–20)
BUN: 12 mg/dL (ref 6–24)
Bilirubin Total: 0.5 mg/dL (ref 0.0–1.2)
CALCIUM: 9.7 mg/dL (ref 8.7–10.2)
CO2: 24 mmol/L (ref 18–29)
CREATININE: 1 mg/dL (ref 0.76–1.27)
Chloride: 102 mmol/L (ref 96–106)
GFR calc Af Amer: 99 mL/min/{1.73_m2} (ref >59)
GFR, EST NON AFRICAN AMERICAN: 86 mL/min/{1.73_m2} (ref >59)
GLOBULIN, TOTAL: 1.9 g/dL (ref 1.5–4.5)
Glucose: 96 mg/dL (ref 65–99)
Potassium: 4.8 mmol/L (ref 3.5–5.2)
Sodium: 142 mmol/L (ref 134–144)
TOTAL PROTEIN: 6.6 g/dL (ref 6.0–8.5)

## 2016-08-06 LAB — LIPID PANEL
CHOLESTEROL TOTAL: 227 mg/dL — AB (ref 100–199)
Chol/HDL Ratio: 3.1 ratio (ref 0.0–5.0)
HDL: 74 mg/dL (ref >39)
LDL CALC: 137 mg/dL — AB (ref 0–99)
Triglycerides: 79 mg/dL (ref 0–149)
VLDL Cholesterol Cal: 16 mg/dL (ref 5–40)

## 2016-08-29 NOTE — Telephone Encounter (Signed)
error 

## 2016-09-18 ENCOUNTER — Encounter: Payer: Self-pay | Admitting: Cardiovascular Disease

## 2016-09-30 ENCOUNTER — Ambulatory Visit: Payer: Managed Care, Other (non HMO) | Admitting: Neurology

## 2016-10-07 ENCOUNTER — Encounter: Payer: Self-pay | Admitting: Cardiovascular Disease

## 2016-10-07 ENCOUNTER — Ambulatory Visit (INDEPENDENT_AMBULATORY_CARE_PROVIDER_SITE_OTHER): Payer: Managed Care, Other (non HMO) | Admitting: Cardiovascular Disease

## 2016-10-07 ENCOUNTER — Encounter (INDEPENDENT_AMBULATORY_CARE_PROVIDER_SITE_OTHER): Payer: Self-pay

## 2016-10-07 VITALS — BP 138/84 | HR 72 | Ht 69.0 in | Wt 227.6 lb

## 2016-10-07 DIAGNOSIS — I25119 Atherosclerotic heart disease of native coronary artery with unspecified angina pectoris: Secondary | ICD-10-CM | POA: Diagnosis not present

## 2016-10-07 DIAGNOSIS — I2510 Atherosclerotic heart disease of native coronary artery without angina pectoris: Secondary | ICD-10-CM

## 2016-10-07 DIAGNOSIS — I10 Essential (primary) hypertension: Secondary | ICD-10-CM

## 2016-10-07 LAB — LIPID PANEL
CHOL/HDL RATIO: 2.7 ratio (ref 0.0–5.0)
Cholesterol, Total: 197 mg/dL (ref 100–199)
HDL: 74 mg/dL (ref >39)
LDL CALC: 112 mg/dL — AB (ref 0–99)
TRIGLYCERIDES: 56 mg/dL (ref 0–149)
VLDL Cholesterol Cal: 11 mg/dL (ref 5–40)

## 2016-10-07 LAB — COMPREHENSIVE METABOLIC PANEL
ALT: 34 IU/L (ref 0–44)
AST: 19 IU/L (ref 0–40)
Albumin/Globulin Ratio: 2.3 — ABNORMAL HIGH (ref 1.2–2.2)
Albumin: 4.6 g/dL (ref 3.5–5.5)
Alkaline Phosphatase: 67 IU/L (ref 39–117)
BUN/Creatinine Ratio: 15 (ref 9–20)
BUN: 16 mg/dL (ref 6–24)
Bilirubin Total: 0.3 mg/dL (ref 0.0–1.2)
CALCIUM: 9.7 mg/dL (ref 8.7–10.2)
CO2: 23 mmol/L (ref 20–29)
CREATININE: 1.05 mg/dL (ref 0.76–1.27)
Chloride: 102 mmol/L (ref 96–106)
GFR calc Af Amer: 93 mL/min/{1.73_m2} (ref >59)
GFR, EST NON AFRICAN AMERICAN: 81 mL/min/{1.73_m2} (ref >59)
GLOBULIN, TOTAL: 2 g/dL (ref 1.5–4.5)
Glucose: 95 mg/dL (ref 65–99)
Potassium: 4.8 mmol/L (ref 3.5–5.2)
SODIUM: 141 mmol/L (ref 134–144)
Total Protein: 6.6 g/dL (ref 6.0–8.5)

## 2016-10-07 NOTE — Progress Notes (Signed)
Mario [name] Date of Birth  [birth year]       [facility]     [address], [place]   [place], [place]  [address]    [phone]      Fax  [phone]      Problem List: 1.  Coronary artery disease-status post PTCA and stenting in 2004 2.  Hyperlipidemia 3. Hypertension     Mario [name] is a 56 yo with hx of CAD.,  He's had an upper respiratory tract viral infection for the past month or so.  He denies any chest pain.  He has been able to do of his normal activities without any significant problems. He works at the [employer] in [place].  [name]. 26, 2015: Mario [name] is doing well.  No angina.   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   BP has been elevated .  He has been tried on HCTZ in the past but it made him feel poorly.    August 17, 2013:  November 16, 2013: Bodies doing well from a cardiac standpoint. His medical doctor recently changed his Nexium to products because of the interaction and potential in addition of Plavix metabolism. He's noted that he's had lots more indigestion and heartburn since stopping the Nexium.  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 We discussed checking a P2 Y  12 assay with him on Plavix and Nexium to prove that he is still inhibited.  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 He notes that he bleeds quite readily if he cuts himself.  November 06, 2015: Mario [name] is seen after a 2 year absence. He has a history of coronary artery disease and hypertension BP has been elevated  Still eats some salty foods.  No CP,  Still very active   July 01, 2016:  Mario [name] has been having some problems with elevated blood pressure recently.   He had a question of some mental status changes March  1. .? TIA symptoms .  Slurring works, [name]  ,   BP was better  170/100. No further confusion .    Has gained 20 lbs over the winter.   Not getting any exercise.   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 clopidogrel (PLAVIX) 75 MG tablet Take 1 tablet (75 mg total) by mouth daily. 90 tablet 4  . ezetimibe (ZETIA) 10 MG tablet Take 1 tablet (10 mg total) by mouth daily. 90 tablet 4  . hydrochlorothiazide (HYDRODIURIL) 12.5 MG tablet Take 2 tablets (25 mg total) by mouth daily. 90 tablet 4  . potassium chloride (K-DUR) 10 MEQ tablet Take 1 tablet (10 mEq total) by mouth daily. 90 tablet 3  . valsartan (DIOVAN) 160 MG tablet Take 1 tablet (160 mg total) by mouth daily.     No current facility-administered medications on file prior to visit.     Allergies  Allergen Reactions  . Crestor [Rosuvastatin Calcium]  Joint pain  . Lipitor [Atorvastatin Calcium]     Joint pain  . Zocor [Simvastatin]     Joint pain    Past Medical History:  Diagnosis Date  . Arthritis   . CAD (coronary artery disease)   . HLD (hyperlipidemia)   . Recurrent chest pain   . Sleep apnea    cpap    Past Surgical History:  Procedure Laterality Date  . COLONOSCOPY WITH PROPOFOL N/A 11/17/2015   Procedure: COLONOSCOPY WITH PROPOFOL;  Surgeon: Lucilla Lame, MD;  Location: [facility];  Service: Endoscopy;  Laterality: N/A;  CPAP  . NASAL SINUS SURGERY    . POLYPECTOMY N/A 11/17/2015   Procedure: POLYPECTOMY;  Surgeon: Lucilla Lame, MD;  Location: [facility];  Service: Endoscopy;  Laterality: N/A;  sigmoid polyp  . PTCA     3 stents    History  Smoking Status  . Former Smoker  . Quit date: 04/22/2001  Smokeless Tobacco  . Current User  . Types: Snuff     History  Alcohol Use  . Yes    Comment: 2 beers per day    Family History  Problem Relation Age of Onset  . Hyperlipidemia Mother   . Hypertension Mother   . Heart disease Mother   . Heart failure Mother        68  . Lung cancer Mother   . Leukemia Father   . Hypertension Sister   . Hypertension Sister   . Coronary artery disease Unknown     Reviw of Systems:  Reviewed in the HPI.  All other systems are negative.  Physical Exam: Blood pressure 138/84, pulse 72, height 5\' 9"  (1.753 m), weight 227 lb 9.6 oz (103.2 kg). General: Well developed, well nourished, in no acute distress. Head: Normocephalic, atraumatic, sclera non-icteric, mucus membranes are moist,  Neck: Supple. Carotids are 2 + without bruits. No JVD  Lungs: Clear  Heart: RR, normal S1, S2 Abdomen: Soft, non-tender, non-distended with normal bowel sounds. Msk:  Strength and tone are normal  Extremities: No clubbing or cyanosis. No edema.  Distal pedal pulses are 2+ and equal  Neuro: CN II - XII intact.  Alert and oriented X 3.  Psych:  Normal   ECG:  Assessment / Plan:   1. Essential HTN:     BP  is better  2. Possible  TIA:  Work up was negative  Has moderate carotid disease   3.  Coronary artery disease-status post PTCA and stenting in 2004   4.  Hyperlipidemia - his last LDL is 100. Continue Zetia. He does not tolerate the statins.    Check labs today  If his LDL is still not to goal, will refer to lipid clinic for initiation of Repatha    I'll see him again in  3 months     Mertie Moores, MD  10/07/2016 8:32 AM    [facility] [address],  [place], [place]  [address] Pager [phone] Phone: [phone]; Fax: [phone]

## 2016-10-07 NOTE — Patient Instructions (Signed)
Medication Instructions:  Your physician recommends that you continue on your current medications as directed. Please refer to the Current Medication list given to you today.   Labwork: TODAY - cholesterol, complete metabolic panel   Testing/Procedures: None Ordered   Follow-Up: Your physician wants you to follow-up in: 1 year with Dr. Acie Fredrickson.  You will receive a reminder letter in the mail two months in advance. If you don't receive a letter, please call our office to schedule the follow-up appointment.   If you need a refill on your cardiac medications before your next appointment, please call your pharmacy.   Thank you for choosing [facility]! Christen Bame, RN [phone]

## 2016-10-10 ENCOUNTER — Telehealth: Payer: Self-pay | Admitting: Nurse Practitioner

## 2016-10-10 MED ORDER — ROSUVASTATIN CALCIUM 5 MG PO TABS: 5.0000 mg | ORAL_TABLET | ORAL | 11 refills | Status: DC

## 2016-10-10 NOTE — Telephone Encounter (Signed)
-----   Message from Thayer Headings, MD sent at 10/07/2016  5:01 PM EDT ----- He is still not to goal Please refer to lipid clinic -

## 2016-10-10 NOTE — Telephone Encounter (Signed)
Upon further discussion with patient about lipid clinic and management of high cholesterol, patient would like to avoid injectable medications and does not live close to our office. He is willing to try rosuvastatin 5 mg every other day. He states he will call back in 1 month to let me know how he is feeling and if he is tolerating well, I will schedule him for 3 month repeat fasting labs. He verbalized understanding and agreement with plan and thanked me for the call.

## 2016-10-11 NOTE — Telephone Encounter (Signed)
Agree with trial of Crestor 5 mg QOD

## 2016-10-14 ENCOUNTER — Telehealth: Payer: Self-pay | Admitting: Family Medicine

## 2016-10-14 NOTE — Telephone Encounter (Signed)
Called to inform patient to schedule an OV. Patient did not answer. Patient did not have a voicemail set up. Will try calling back.

## 2016-10-14 NOTE — Telephone Encounter (Signed)
Patient asks if he could get an appointment to [facility] clinic for arthritis to see provider Francisca December.   Please Advise.  Thank you

## 2016-10-14 NOTE — Telephone Encounter (Signed)
Pt was to come in April for 6 month follow up. Please have him schedule this appointment and referral can be discussed at that [facility] so we have notes to send with the referral.

## 2016-10-15 NOTE — Telephone Encounter (Signed)
Called patient and scheduled an appointment for Wednesday 10/30/2016 at 2:45 pm.

## 2016-10-29 ENCOUNTER — Other Ambulatory Visit: Payer: Self-pay | Admitting: Cardiovascular Disease

## 2016-10-29 MED ORDER — VALSARTAN 160 MG PO TABS: 160.0000 mg | ORAL_TABLET | Freq: Every day | ORAL | 3 refills | Status: DC

## 2016-10-30 ENCOUNTER — Encounter: Payer: Self-pay | Admitting: Family Medicine

## 2016-10-30 ENCOUNTER — Ambulatory Visit (INDEPENDENT_AMBULATORY_CARE_PROVIDER_SITE_OTHER): Payer: Managed Care, Other (non HMO) | Admitting: Family Medicine

## 2016-10-30 VITALS — BP 142/86 | HR 70 | Wt 225.8 lb

## 2016-10-30 DIAGNOSIS — I10 Essential (primary) hypertension: Secondary | ICD-10-CM

## 2016-10-30 DIAGNOSIS — M19042 Primary osteoarthritis, left hand: Secondary | ICD-10-CM | POA: Diagnosis not present

## 2016-10-30 DIAGNOSIS — E7800 Pure hypercholesterolemia, unspecified: Secondary | ICD-10-CM

## 2016-10-30 DIAGNOSIS — I70219 Atherosclerosis of native arteries of extremities with intermittent claudication, unspecified extremity: Secondary | ICD-10-CM | POA: Diagnosis not present

## 2016-10-30 DIAGNOSIS — M19041 Primary osteoarthritis, right hand: Secondary | ICD-10-CM | POA: Diagnosis not present

## 2016-10-30 DIAGNOSIS — I25119 Atherosclerotic heart disease of native coronary artery with unspecified angina pectoris: Secondary | ICD-10-CM

## 2016-10-30 NOTE — Assessment & Plan Note (Signed)
Patient with arthritis of both hands wrists elbows and especially left shoulder will refer to rheumatology to further evaluate

## 2016-10-30 NOTE — Assessment & Plan Note (Signed)
Will refer to vascular to further evaluate claudication symptoms in his right leg.

## 2016-10-30 NOTE — Progress Notes (Signed)
BP (!) 142/86 (BP Location: Left Arm)   Pulse 70   Wt 225 lb 12.8 oz (102.4 kg)   SpO2 98%   BMI 33.34 kg/m    Subjective:    Patient ID: Mario [name], male    DOB: [birth year], 56 y.o.   MRN: [id number]  HPI: Mario [name] is a 56 y.o. male  Chief Complaint  Patient presents with  . Follow-up  . Hyperlipidemia  Patient with long history of arthritis hands and wrists elbows and especially left shoulder is been getting worse over the years and primarily worse over these last few months. Developed nodules on his wrist and knuckles. Unable to fully extend his elbows and limited range of motion of his shoulder. Has tried OTC medications and nothing seems to really help. Getting to the point where he can't sleep at night because it just hurts.  Patient also with marked claudication symptoms in his right calf comes on with walking a block or 2 goes away with rest and then is able to walk again then may or may not come back again. Does not occur in his left leg.  Patient's just saw cardiologist changed Benzapril to valsartan and blood pressure doing well with checks with cardiology and at home. Up here slightly. Taking cholesterol without issues or problems.   Relevant past medical, surgical, family and social history reviewed and updated as indicated. Interim medical history since our last visit reviewed. Allergies and medications reviewed and updated.  Except for as noted above Review of Systems  Constitutional: Negative.   HENT: Negative.   Eyes: Negative.   Respiratory: Negative.   Cardiovascular: Negative.   Gastrointestinal: Negative.   Endocrine: Negative.   Genitourinary: Negative.   Musculoskeletal: Negative.   Skin: Negative.   Allergic/Immunologic: Negative.   Neurological: Negative.   Hematological: Negative.   Psychiatric/Behavioral: Negative.     Per HPI unless specifically indicated above     Objective:    BP (!) 142/86 (BP Location: Left Arm)   Pulse  70   Wt 225 lb 12.8 oz (102.4 kg)   SpO2 98%   BMI 33.34 kg/m   Wt Readings from Last 3 Encounters:  10/30/16 225 lb 12.8 oz (102.4 kg)  10/07/16 227 lb 9.6 oz (103.2 kg)  07/03/16 225 lb (102.1 kg)    Physical Exam  Constitutional: He is oriented to person, place, and time. He appears well-developed and well-nourished.  HENT:  Head: Normocephalic and atraumatic.  Mouth/Throat: Oropharynx is clear and moist.  Eyes: Conjunctivae and EOM are normal.  Neck: Normal range of motion. No tracheal deviation present. No thyromegaly present.  Cardiovascular: Normal rate, regular rhythm and normal heart sounds.   No murmur heard. Pulmonary/Chest: Effort normal and breath sounds normal. No respiratory distress. He has no wheezes. He has no rales. He exhibits no tenderness.  Abdominal: He exhibits no distension and no mass. There is no tenderness. There is no rebound and no guarding.  Musculoskeletal: Normal range of motion. He exhibits tenderness. He exhibits no edema or deformity.  Can't palpate pulses right anterior posterior tibial Arthritis changes as noted in history  Lymphadenopathy:    He has no cervical adenopathy.  Neurological: He is alert and oriented to person, place, and time. No cranial nerve deficit. Coordination normal.  Skin: No erythema.  Psychiatric: He has a normal mood and affect. His behavior is normal. Judgment and thought content normal.   reviewed labs and stable. As noted below.  Results for  orders placed or performed in visit on 10/07/16  Comp Met (CMET)  Result Value Ref Range   Glucose 95 65 - 99 mg/dL   BUN 16 6 - 24 mg/dL   Creatinine, Ser 1.05 0.76 - 1.27 mg/dL   GFR calc non Af Amer 81 >59 mL/min/1.73   GFR calc Af Amer 93 >59 mL/min/1.73   BUN/Creatinine Ratio 15 9 - 20   Sodium 141 134 - 144 mmol/L   Potassium 4.8 3.5 - 5.2 mmol/L   Chloride 102 96 - 106 mmol/L   CO2 23 20 - 29 mmol/L   Calcium 9.7 8.7 - 10.2 mg/dL   Total Protein 6.6 6.0 - 8.5 g/dL    Albumin 4.6 3.5 - 5.5 g/dL   Globulin, Total 2.0 1.5 - 4.5 g/dL   Albumin/Globulin Ratio 2.3 (H) 1.2 - 2.2   Bilirubin Total 0.3 0.0 - 1.2 mg/dL   Alkaline Phosphatase 67 39 - 117 IU/L   AST 19 0 - 40 IU/L   ALT 34 0 - 44 IU/L  Lipid Profile  Result Value Ref Range   Cholesterol, Total 197 100 - 199 mg/dL   Triglycerides 56 0 - 149 mg/dL   HDL 74 >39 mg/dL   VLDL Cholesterol Cal 11 5 - 40 mg/dL   LDL Calculated 112 (H) 0 - 99 mg/dL   Chol/HDL Ratio 2.7 0.0 - 5.0 ratio      Assessment & Plan:   Problem List Items Addressed This Visit      Cardiovascular and Mediastinum   HTN (hypertension) - Primary    The current medical regimen is effective;  continue present plan and medications.       CAD (coronary artery disease)    The current medical regimen is effective;  continue present plan and medications.       Extremity atherosclerosis with intermittent claudication ([place])    Will refer to vascular to further evaluate claudication symptoms in his right leg.      Relevant Orders   Ambulatory referral to Vascular Surgery     Musculoskeletal and Integument   Arthritis of finger of both hands    Patient with arthritis of both hands wrists elbows and especially left shoulder will refer to rheumatology to further evaluate      Relevant Orders   Ambulatory referral to Rheumatology     Other   Hyperlipidemia     Patient education on blue foot sudden changes in leg symptoms. And potential life-threatening complications with severe exacerbation or severe progression would need to go to the emergency room.  Also reviewed had carotid ultrasound which was normal. Patient to continue taking Plavix. We will leave blood pressure medication as recent change.  Follow up plan: Return in about 4 weeks (around 11/27/2016) for Review multiple referrals and changes in medicines if made.

## 2016-10-30 NOTE — Assessment & Plan Note (Signed)
The current medical regimen is effective;  continue present plan and medications.  

## 2016-11-06 DIAGNOSIS — I10 Essential (primary) hypertension: Secondary | ICD-10-CM | POA: Insufficient documentation

## 2016-11-06 DIAGNOSIS — M2550 Pain in unspecified joint: Secondary | ICD-10-CM | POA: Insufficient documentation

## 2016-11-08 ENCOUNTER — Telehealth: Payer: Self-pay | Admitting: Family Medicine

## 2016-11-08 NOTE — Telephone Encounter (Signed)
Routing to provider for advice.

## 2016-11-08 NOTE — Telephone Encounter (Signed)
Pt called and stated that he stopped taking his valsartan yesterday because he heard there was a recall on it.He would like to know what he should about this medication.

## 2016-11-08 NOTE — Telephone Encounter (Signed)
He should check with his pharmacy to see if his particular valsartan is recalled. If it's not, he should keep taking it.

## 2016-11-08 NOTE — Telephone Encounter (Signed)
Patient notified

## 2016-11-14 ENCOUNTER — Other Ambulatory Visit: Payer: Self-pay | Admitting: Family Medicine

## 2016-11-15 NOTE — Telephone Encounter (Signed)
Last OV: 10/30/16 Next OV: None on file.   BMP Latest Ref Rng & Units 10/07/2016 08/06/2016 01/29/2016  Glucose 65 - 99 mg/dL 95 96 85  BUN 6 - 24 mg/dL 16 12 14   Creatinine 0.76 - 1.27 mg/dL 1.05 1.00 0.93  BUN/Creat Ratio 9 - 20 15 12 15   Sodium 134 - 144 mmol/L 141 142 142  Potassium 3.5 - 5.2 mmol/L 4.8 4.8 4.3  Chloride 96 - 106 mmol/L 102 102 101  CO2 20 - 29 mmol/L 23 24 22   Calcium 8.7 - 10.2 mg/dL 9.7 9.7 9.5

## 2016-11-20 DIAGNOSIS — M19021 Primary osteoarthritis, right elbow: Secondary | ICD-10-CM | POA: Insufficient documentation

## 2016-11-20 DIAGNOSIS — M19022 Primary osteoarthritis, left elbow: Secondary | ICD-10-CM | POA: Insufficient documentation

## 2016-11-22 ENCOUNTER — Encounter (INDEPENDENT_AMBULATORY_CARE_PROVIDER_SITE_OTHER): Payer: Managed Care, Other (non HMO) | Admitting: Vascular Surgery

## 2016-12-09 ENCOUNTER — Other Ambulatory Visit: Payer: Self-pay | Admitting: Pharmacist

## 2016-12-09 ENCOUNTER — Telehealth: Payer: Self-pay | Admitting: Pharmacist

## 2016-12-09 MED ORDER — IRBESARTAN 150 MG PO TABS: 150.0000 mg | ORAL_TABLET | Freq: Every day | ORAL | 3 refills | Status: DC

## 2016-12-09 NOTE — Telephone Encounter (Signed)
Pt affected by valsartan recall. Will switch to equivalent dose of irbesartan 150mg  daily. Pt aware to monitor BP over the next few weeks to ensure they remain stable.

## 2017-01-01 ENCOUNTER — Telehealth: Payer: Self-pay | Admitting: Nurse Practitioner

## 2017-01-01 MED ORDER — ROSUVASTATIN CALCIUM 5 MG PO TABS: 5.0000 mg | ORAL_TABLET | ORAL | 3 refills | Status: DC

## 2017-01-01 NOTE — Telephone Encounter (Signed)
-----   Message from Emmaline Life, RN sent at 10/10/2016  4:16 PM EDT ----- Needs 3 mo repeat lipid/cmet in September if tolerating rosuvastatin 5 mg QOD

## 2017-01-01 NOTE — Telephone Encounter (Signed)
Spoke with patient about hyperlipidemia. He states he is currently taking Zetia and is not taking any Crestor. He states he is not interested in an appointment with lipid clinic to discuss PSK9 inhibitor therapy. He states he has been working on better diet but denies weight loss. He has been referred by his PCP to a vascular surgeon for claudication and has had difficulty exercising due to the pain. I advised that if blockage is found in lower extremity that this will further emphasize the need for good lipid control. He states he is willing to restart rosuvastatin 5 mg every other day or every 3rd day to see if he tolerates. I advised him to call back to report if he does not tolerate the medication and that if tolerated, we will recheck his cholesterol levels in May or June prior to 1 year f/u with Dr. Acie Fredrickson. He verbalized understanding and agreement and thanked me for the call.

## 2017-01-14 ENCOUNTER — Encounter (INDEPENDENT_AMBULATORY_CARE_PROVIDER_SITE_OTHER): Payer: Self-pay | Admitting: Vascular Surgery

## 2017-01-14 ENCOUNTER — Ambulatory Visit (INDEPENDENT_AMBULATORY_CARE_PROVIDER_SITE_OTHER): Payer: Managed Care, Other (non HMO) | Admitting: Vascular Surgery

## 2017-01-14 VITALS — BP 143/91 | HR 78 | Resp 17 | Ht 69.0 in | Wt 224.0 lb

## 2017-01-14 DIAGNOSIS — I25119 Atherosclerotic heart disease of native coronary artery with unspecified angina pectoris: Secondary | ICD-10-CM | POA: Diagnosis not present

## 2017-01-14 DIAGNOSIS — E7800 Pure hypercholesterolemia, unspecified: Secondary | ICD-10-CM

## 2017-01-14 DIAGNOSIS — I70219 Atherosclerosis of native arteries of extremities with intermittent claudication, unspecified extremity: Secondary | ICD-10-CM

## 2017-01-14 DIAGNOSIS — I10 Essential (primary) hypertension: Secondary | ICD-10-CM

## 2017-01-14 NOTE — Assessment & Plan Note (Signed)
blood pressure control important in reducing the progression of atherosclerotic disease. On appropriate oral medications.  

## 2017-01-14 NOTE — Patient Instructions (Signed)

## 2017-01-14 NOTE — Assessment & Plan Note (Signed)
The natural history and pathophysiology of peripheral arterial disease were discussed. The patient appears to have lifestyle limiting claudication worse in the right leg on the left. Recommend:  Patient should undergo arterial duplex of the lower extremity ASAP because there has been a significant deterioration in the patient's lower extremity symptoms.  The patient states they are having increased pain and a marked decrease in the distance that they can walk.  The risks and benefits as well as the alternatives were discussed in detail with the patient.  All questions were answered.  Patient agrees to proceed and understands this could be a prelude to angiography and intervention.  The patient will follow up with me in the office to review the studies.

## 2017-01-14 NOTE — Assessment & Plan Note (Signed)
lipid control important in reducing the progression of atherosclerotic disease. Intolerance to multiple statin medications

## 2017-01-14 NOTE — Assessment & Plan Note (Signed)
Follows with cardiology. No current angina but is unable to walk as much as he needs to secondary to leg pain

## 2017-01-14 NOTE — [place] Notes (Signed)
Patient ID: Mario [name], male   DOB: 26-Oct-1960, 56 y.o.   MRN: [id number]  Chief Complaint  Patient presents with  . Claudication    R Le Calf    HPI Mario [name] is a 56 y.o. male.  I am asked to see the patient by Dr. Jeananne Rama for evaluation of PAD with claudication.  The patient reports right calf pain with only about 100 feet of walking. This has been a gradually worsening problem over the last year or 2. There was no clear inciting event or causative factor that started the pain. He denies any previous surgery, trauma, injury, or blood clots in the right leg to his knowledge. He has gained over 10 pounds this year secondary to decreased mobility from leg pain primarily. This is impairing his ability to work and do normal activities. He notices some pain and cramping in his left leg as well as are not as severe as the right. The pain starts in the calf and sometimes radiates down to the ankle to the thigh. Stopping to rest is really the only thing that helps the pain. He has a long history of atherosclerotic disease and has 3 coronary stents in place. He has multiple atherosclerotic risk factors as listed below   Past Medical History:  Diagnosis Date  . Arthritis   . CAD (coronary artery disease)   . HLD (hyperlipidemia)   . Recurrent chest pain   . Sleep apnea    cpap    Past Surgical History:  Procedure Laterality Date  . COLONOSCOPY WITH PROPOFOL N/A 11/17/2015   Procedure: COLONOSCOPY WITH PROPOFOL;  Surgeon: Lucilla Lame, MD;  Location: [facility];  Service: Endoscopy;  Laterality: N/A;  CPAP  . NASAL SINUS SURGERY    . POLYPECTOMY N/A 11/17/2015   Procedure: POLYPECTOMY;  Surgeon: Lucilla Lame, MD;  Location: [facility];  Service: Endoscopy;  Laterality: N/A;  sigmoid polyp  . PTCA     3 stents    Family History  Problem Relation Age of Onset  . Hyperlipidemia Mother   . Hypertension Mother   . Heart disease Mother   . Heart failure Mother      51  . Lung cancer Mother   . Leukemia Father   . Hypertension Sister   . Hypertension Sister   . Coronary artery disease Unknown      Social History Social History  Substance Use Topics  . Smoking status: Former Smoker    Quit date: 04/22/2001  . Smokeless tobacco: Current User    Types: Snuff  . Alcohol use Yes     Comment: 2 beers per day  No IVDU  Allergies  Allergen Reactions  . Crestor [Rosuvastatin Calcium]     Joint pain  . Lipitor [Atorvastatin Calcium]     Joint pain  . Zocor [Simvastatin]     Joint pain    Current Outpatient Prescriptions  Medication Sig Dispense Refill  . aspirin 81 MG chewable tablet Chew by mouth daily.    . clopidogrel (PLAVIX) 75 MG tablet Take 1 tablet (75 mg total) by mouth daily. 90 tablet 4  . ezetimibe (ZETIA) 10 MG tablet Take 1 tablet (10 mg total) by mouth daily. 90 tablet 4  . hydrochlorothiazide (HYDRODIURIL) 12.5 MG tablet Take 2 tablets (25 mg total) by mouth daily. 90 tablet 4  . irbesartan (AVAPRO) 150 MG tablet Take 1 tablet (150 mg total) by mouth daily. 90 tablet 3  . potassium  chloride (K-DUR) 10 MEQ tablet Take 1 tablet (10 mEq total) by mouth daily. 90 tablet 3  . rosuvastatin (CRESTOR) 5 MG tablet Take 1 tablet (5 mg total) by mouth every other day. 45 tablet 3   No current facility-administered medications for this visit.       REVIEW OF SYSTEMS (Negative unless checked)  Constitutional: [] Weight loss  [] Fever  [] Chills Cardiac: [x] Chest pain   [] Chest pressure   [] Palpitations   [] Shortness of breath when laying flat   [] Shortness of breath at rest   [] Shortness of breath with exertion. Vascular:  [x] Pain in legs with walking   [] Pain in legs at rest   [] Pain in legs when laying flat   [x] Claudication   [x] Pain in feet when walking  [] Pain in feet at rest  [] Pain in feet when laying flat   [] History of DVT   [] Phlebitis   [] Swelling in legs   [] Varicose veins   [] Non-healing ulcers Pulmonary:   [] Uses home oxygen    [] Productive cough   [] Hemoptysis   [] Wheeze  [] COPD   [] Asthma Neurologic:  [] Dizziness  [] Blackouts   [] Seizures   [] History of stroke   [] History of TIA  [] Aphasia   [] Temporary blindness   [] Dysphagia   [] Weakness or numbness in arms   [] Weakness or numbness in legs Musculoskeletal:  [x] Arthritis   [] Joint swelling   [] Joint pain   [] Low back pain Hematologic:  [] Easy bruising  [] Easy bleeding   [] Hypercoagulable state   [] Anemic  [] Hepatitis Gastrointestinal:  [] Blood in stool   [] Vomiting blood  [x] Gastroesophageal reflux/heartburn   [] Abdominal pain Genitourinary:  [] Chronic kidney disease   [] Difficult urination  [] Frequent urination  [] Burning with urination   [] Hematuria Skin:  [] Rashes   [] Ulcers   [] Wounds Psychological:  [] History of anxiety   []  History of major depression.    Physical Exam BP (!) 143/91 (BP Location: Right Arm)   Pulse 78   Resp 17   Ht 5\' 9"  (1.753 m)   Wt 224 lb (101.6 kg)   BMI 33.08 kg/m  Gen:  WD/WN, NAD Head: [place]/AT, No temporalis wasting.  Ear/Nose/Throat: Hearing grossly intact, nares w/o erythema or drainage, oropharynx w/o Erythema/Exudate Eyes: Conjunctiva clear, sclera non-icteric  Neck: trachea midline.  No JVD.  Pulmonary:  Good air movement, respirations not labored, no use of accessory muscles Cardiac: RRR, no JVD Vascular:  Vessel Right Left  Radial Palpable Palpable                          PT 1+ Palpable 1+ Palpable  DP 1+ Palpable Palpable   Gastrointestinal: soft, non-tender/non-distended. Musculoskeletal: M/S 5/5 throughout.  Extremities without acute ischemic changes.  No deformity or atrophy. Trace LE edema. Neurologic: Sensation grossly intact in extremities.  Symmetrical.  Speech is fluent. Motor exam as listed above. Psychiatric: Judgment intact, Mood & affect appropriate for pt's clinical situation. Dermatologic: No rashes or ulcers noted.  No cellulitis or open wounds.    Radiology No results  found.  Labs No results found for this or any previous visit (from the past 2160 hour(s)).  Assessment/Plan:  CAD (coronary artery disease) Follows with cardiology. No current angina but is unable to walk as much as he needs to secondary to leg pain  HTN (hypertension) blood pressure control important in reducing the progression of atherosclerotic disease. On appropriate oral medications.   Hyperlipidemia lipid control important in reducing the progression of atherosclerotic disease. Intolerance to  multiple statin medications   Extremity atherosclerosis with intermittent claudication ([place]) The natural history and pathophysiology of peripheral arterial disease were discussed. The patient appears to have lifestyle limiting claudication worse in the right leg on the left. Recommend:  Patient should undergo arterial duplex of the lower extremity ASAP because there has been a significant deterioration in the patient's lower extremity symptoms.  The patient states they are having increased pain and a marked decrease in the distance that they can walk.  The risks and benefits as well as the alternatives were discussed in detail with the patient.  All questions were answered.  Patient agrees to proceed and understands this could be a prelude to angiography and intervention.  The patient will follow up with me in the office to review the studies.       Leotis Pain 01/14/2017, 3:55 PM   This note was created with Dragon medical transcription system.  Any errors from dictation are unintentional.

## 2017-02-02 ENCOUNTER — Other Ambulatory Visit: Payer: Self-pay | Admitting: Family Medicine

## 2017-02-07 ENCOUNTER — Other Ambulatory Visit: Payer: Self-pay

## 2017-02-07 MED ORDER — ROSUVASTATIN CALCIUM 5 MG PO TABS: 5.0000 mg | ORAL_TABLET | ORAL | 3 refills | Status: DC

## 2017-02-07 MED ORDER — IRBESARTAN 150 MG PO TABS: 150.0000 mg | ORAL_TABLET | Freq: Every day | ORAL | 3 refills | Status: DC

## 2017-02-12 ENCOUNTER — Other Ambulatory Visit: Payer: Self-pay | Admitting: *Deleted

## 2017-02-12 DIAGNOSIS — I6523 Occlusion and stenosis of bilateral carotid arteries: Secondary | ICD-10-CM

## 2017-02-13 ENCOUNTER — Encounter (INDEPENDENT_AMBULATORY_CARE_PROVIDER_SITE_OTHER): Payer: Self-pay

## 2017-02-13 ENCOUNTER — Other Ambulatory Visit (INDEPENDENT_AMBULATORY_CARE_PROVIDER_SITE_OTHER): Payer: Managed Care, Other (non HMO)

## 2017-02-13 ENCOUNTER — Ambulatory Visit (INDEPENDENT_AMBULATORY_CARE_PROVIDER_SITE_OTHER): Payer: Managed Care, Other (non HMO) | Admitting: Vascular Surgery

## 2017-02-13 ENCOUNTER — Encounter (INDEPENDENT_AMBULATORY_CARE_PROVIDER_SITE_OTHER): Payer: Self-pay | Admitting: Vascular Surgery

## 2017-02-13 ENCOUNTER — Encounter (INDEPENDENT_AMBULATORY_CARE_PROVIDER_SITE_OTHER): Payer: Managed Care, Other (non HMO)

## 2017-02-13 VITALS — BP 152/91 | HR 56 | Resp 16 | Ht 69.0 in | Wt 225.0 lb

## 2017-02-13 DIAGNOSIS — I70219 Atherosclerosis of native arteries of extremities with intermittent claudication, unspecified extremity: Secondary | ICD-10-CM

## 2017-02-13 DIAGNOSIS — E7800 Pure hypercholesterolemia, unspecified: Secondary | ICD-10-CM | POA: Diagnosis not present

## 2017-02-13 DIAGNOSIS — I10 Essential (primary) hypertension: Secondary | ICD-10-CM | POA: Diagnosis not present

## 2017-02-13 NOTE — [place] Notes (Signed)
Subjective:    Patient ID: Mario [name], male    DOB: [birth year], 56 y.o.   MRN: [id number] Chief Complaint  Patient presents with  . Follow-up    ABI and Arterial   Patient presents to review vascular studies. Patient was last seen on 01/14/2017 for evaluation of peripheral artery disease. He presents today with worsening right calf claudication. Patient states he walks a lot for work and has noticed an interval shortening in his claudication distance. His claudication has become very painful. He denies any rest pain or ulceration to the lower extremity. His discomfort has progressed to the point he is unable to function on a daily basis. He underwent an ABI which was notable for no significant lower extremity arterial occlusive disease. The toe brachial indices are mildly abnormal on the right and normal on the left. There is no previous for comparison. Denies any fever, nausea or vomiting.   Review of Systems  Constitutional: Negative.   HENT: Negative.   Eyes: Negative.   Respiratory: Negative.   Cardiovascular:       Right calf claudication  Gastrointestinal: Negative.   Endocrine: Negative.   Genitourinary: Negative.   Musculoskeletal: Negative.   Skin: Negative.   Allergic/Immunologic: Negative.   Neurological: Negative.   Hematological: Negative.   Psychiatric/Behavioral: Negative.       Objective:   Physical Exam  Constitutional: He is oriented to person, place, and time. He appears well-developed and well-nourished. No distress.  HENT:  Head: Normocephalic and atraumatic.  Eyes: Pupils are equal, round, and reactive to light. Conjunctivae are normal.  Neck: Normal range of motion.  Cardiovascular: Normal rate, regular rhythm, normal heart sounds and intact distal pulses.   Pulses:      Radial pulses are 2+ on the right side, and 2+ on the left side.       Dorsalis pedis pulses are 2+ on the right side, and 2+ on the left side.       Posterior tibial pulses are 1+  on the right side, and 2+ on the left side.  Pulmonary/Chest: Effort normal.  Musculoskeletal: Normal range of motion. He exhibits no edema.  Neurological: He is alert and oriented to person, place, and time.  Skin: Skin is warm and dry. He is not diaphoretic.  Psychiatric: He has a normal mood and affect. His behavior is normal. Judgment and thought content normal.  Vitals reviewed.  BP (!) 152/91 (BP Location: Right Arm)   Pulse (!) 56   Resp 16   Ht 5\' 9"  (1.753 m)   Wt 225 lb (102.1 kg)   BMI 33.23 kg/m   Past Medical History:  Diagnosis Date  . Arthritis   . CAD (coronary artery disease)   . HLD (hyperlipidemia)   . Recurrent chest pain   . Sleep apnea    cpap   Social History   Social History  . Marital status: Married    Spouse name: N/A  . Number of children: 3  . Years of education: 12   Occupational History  . Not on file.   Social History Main Topics  . Smoking status: Former Smoker    Quit date: 04/22/2001  . Smokeless tobacco: Current User    Types: Snuff  . Alcohol use Yes     Comment: 2 beers per day  . Drug use: No  . Sexual activity: Not on file   Other Topics Concern  . Not on file   Social History Narrative  Lives at home with his wife.   Right-handed.   No daily caffeine use.   Past Surgical History:  Procedure Laterality Date  . COLONOSCOPY WITH PROPOFOL N/A 11/17/2015   Procedure: COLONOSCOPY WITH PROPOFOL;  Surgeon: Lucilla Lame, MD;  Location: [facility];  Service: Endoscopy;  Laterality: N/A;  CPAP  . NASAL SINUS SURGERY    . POLYPECTOMY N/A 11/17/2015   Procedure: POLYPECTOMY;  Surgeon: Lucilla Lame, MD;  Location: [facility];  Service: Endoscopy;  Laterality: N/A;  sigmoid polyp  . PTCA     3 stents   Family History  Problem Relation Age of Onset  . Hyperlipidemia Mother   . Hypertension Mother   . Heart disease Mother   . Heart failure Mother        59  . Lung cancer Mother   . Leukemia Father   .  Hypertension Sister   . Hypertension Sister   . Coronary artery disease Unknown    Allergies  Allergen Reactions  . Crestor [Rosuvastatin Calcium]     Joint pain  . Lipitor [Atorvastatin Calcium]     Joint pain  . Zocor [Simvastatin]     Joint pain      Assessment & Plan:  Patient presents to review vascular studies. Patient was last seen on 01/14/2017 for evaluation of peripheral artery disease. He presents today with worsening right calf claudication. Patient states he walks a lot for work and has noticed an interval shortening in his claudication distance. His claudication has become very painful. He denies any rest pain or ulceration to the lower extremity. His discomfort has progressed to the point he is unable to function on a daily basis. He underwent an ABI which was notable for no significant lower extremity arterial occlusive disease. The toe brachial indices are mildly abnormal on the right and normal on the left. There is no previous for comparison. Denies any fever, nausea or vomiting.  1. Extremity atherosclerosis with intermittent claudication ([place]) - Stable Patient with adding progressively worsening right calf claudication. His claudication distance has shortened His discomfort has progressive the point he is unable to function on a daily basis. ABI relatively normal with mildly abnormal toe brachial indices on the right Recommend a right lower extremity angiogram with possible intervention to assess anatomy and revascularize the leg if appropriate  2. Pure hypercholesterolemia - Stable Encouraged good control as its slows the progression of atherosclerotic disease  3. Essential hypertension - Stable Encouraged good control as its slows the progression of atherosclerotic disease  Current Outpatient Prescriptions on File Prior to Visit  Medication Sig Dispense Refill  . aspirin 81 MG chewable tablet Chew by mouth daily.    . clopidogrel (PLAVIX) 75 MG tablet TAKE ONE  TABLET BY MOUTH ONCE DAILY 30 tablet 14  . ezetimibe (ZETIA) 10 MG tablet TAKE ONE TABLET BY MOUTH ONCE DAILY 30 tablet 14  . rosuvastatin (CRESTOR) 5 MG tablet Take 1 tablet (5 mg total) by mouth every other day. 45 tablet 3  . potassium chloride (K-DUR) 10 MEQ tablet Take 1 tablet (10 mEq total) by mouth daily. (Patient not taking: Reported on 02/13/2017) 90 tablet 3   No current facility-administered medications on file prior to visit.     There are no Patient Instructions on file for this visit. No Follow-up on file.   Keniah Klemmer A Arieon Scalzo, PA-C

## 2017-02-14 ENCOUNTER — Other Ambulatory Visit (INDEPENDENT_AMBULATORY_CARE_PROVIDER_SITE_OTHER): Payer: Self-pay | Admitting: Vascular Surgery

## 2017-02-17 ENCOUNTER — Other Ambulatory Visit: Payer: Self-pay

## 2017-02-17 MED ORDER — EZETIMIBE 10 MG PO TABS: 10.0000 mg | ORAL_TABLET | Freq: Every day | ORAL | 0 refills | Status: DC

## 2017-02-17 MED ORDER — CLOPIDOGREL BISULFATE 75 MG PO TABS: 75.0000 mg | ORAL_TABLET | Freq: Every day | ORAL | 0 refills | Status: DC

## 2017-02-17 NOTE — Telephone Encounter (Signed)
Routing to provider for refill. Prescriptions were sent in the other day but were sent to the wrong pharmacy by mistake.

## 2017-02-17 NOTE — Telephone Encounter (Signed)
Copied from [place] #[id number]. Topic: Quick Communication - See Telephone Encounter >> Feb 17, 2017 11:41 AM Burnis Medin, NT wrote: CRM for notification. See Telephone encounter for:  02/17/17. Mario [name] is calling from [facility] about prescription Cloidogrel and Ezetimibe. Medication request has been faxed already 2 times.

## 2017-02-26 ENCOUNTER — Encounter: Payer: Self-pay | Admitting: Physician Assistant

## 2017-02-26 ENCOUNTER — Telehealth (INDEPENDENT_AMBULATORY_CARE_PROVIDER_SITE_OTHER): Payer: Self-pay

## 2017-02-26 ENCOUNTER — Ambulatory Visit: Payer: Self-pay | Admitting: Physician Assistant

## 2017-02-26 VITALS — BP 132/90 | HR 76 | Temp 98.4°F

## 2017-02-26 DIAGNOSIS — R252 Cramp and spasm: Secondary | ICD-10-CM

## 2017-02-26 NOTE — [place] Notes (Signed)
S: Patient complains of right lower leg pain. States it gets worse as he walks, it will get better with rest. Was seen at vascular vein in the scan was normal. States they wants to "cut him "to see if there is some blockage, states he wonders if it's from taking too cholesterol medications at the same time. His pharmacist told him that some of the medications he is on could cause the cramping in the legs. He like to assess this prior to have an procedure.  O: Vitals within normal limits, right lower leg is nontender, neurovascular is intact  A: Muscle cramps    P: Ordered labs, had discussion with patient about signs and symptoms of claudication, he states it does sound like claudication could be the culprit, states he will call his cardiologist to discuss with him, we will call him with lab results

## 2017-02-26 NOTE — Telephone Encounter (Signed)
Patient called on 02/25/17 to cancel his procedure, he stated the pharmacy told him that his medication could be causing his symptoms. The procedure was canceled for 02/27/17.

## 2017-02-27 ENCOUNTER — Ambulatory Visit: Admit: 2017-02-27 | Payer: Managed Care, Other (non HMO) | Admitting: Vascular Surgery

## 2017-02-27 LAB — CMP12+LP+TP+TSH+6AC+PSA+CBC…
ALT: 41 IU/L (ref 0–44)
AST: 21 IU/L (ref 0–40)
Albumin/Globulin Ratio: 2.5 — ABNORMAL HIGH (ref 1.2–2.2)
Albumin: 4.8 g/dL (ref 3.5–5.5)
Alkaline Phosphatase: 63 IU/L (ref 39–117)
BUN/Creatinine Ratio: 10 (ref 9–20)
BUN: 10 mg/dL (ref 6–24)
Basophils Absolute: 0.1 10*3/uL (ref 0.0–0.2)
Basos: 1 %
Bilirubin Total: 0.5 mg/dL (ref 0.0–1.2)
Calcium: 9.7 mg/dL (ref 8.7–10.2)
Chloride: 103 mmol/L (ref 96–106)
Chol/HDL Ratio: 2.1 ratio (ref 0.0–5.0)
Cholesterol, Total: 182 mg/dL (ref 100–199)
Creatinine, Ser: 0.96 mg/dL (ref 0.76–1.27)
EOS (ABSOLUTE): 0.2 10*3/uL (ref 0.0–0.4)
Eos: 4 %
Estimated CHD Risk: <0.5 times avg. (ref 0.0–1.0)
Free Thyroxine Index: 1.2 (ref 1.2–4.9)
GFR calc Af Amer: 103 mL/min/{1.73_m2} (ref >59)
GFR calc non Af Amer: 89 mL/min/{1.73_m2} (ref >59)
GGT: 74 IU/L — ABNORMAL HIGH (ref 0–65)
Globulin, Total: 1.9 g/dL (ref 1.5–4.5)
Glucose: 92 mg/dL (ref 65–99)
HDL: 87 mg/dL (ref >39)
Hematocrit: 45.3 % (ref 37.5–51.0)
Hemoglobin: 15.5 g/dL (ref 13.0–17.7)
Immature Grans (Abs): 0 10*3/uL (ref 0.0–0.1)
Immature Granulocytes: 0 %
Iron: 128 ug/dL (ref 38–169)
LDH: 166 IU/L (ref 121–224)
LDL Calculated: 81 mg/dL (ref 0–99)
Lymphocytes Absolute: 2.1 10*3/uL (ref 0.7–3.1)
Lymphs: 37 %
MCH: 31.6 pg (ref 26.6–33.0)
MCHC: 34.2 g/dL (ref 31.5–35.7)
MCV: 92 fL (ref 79–97)
Monocytes Absolute: 0.5 10*3/uL (ref 0.1–0.9)
Monocytes: 8 %
Neutrophils Absolute: 2.8 10*3/uL (ref 1.4–7.0)
Neutrophils: 50 %
Phosphorus: 3.6 mg/dL (ref 2.5–4.5)
Platelets: 203 10*3/uL (ref 150–379)
Potassium: 4.7 mmol/L (ref 3.5–5.2)
Prostate Specific Ag, Serum: 1.1 ng/mL (ref 0.0–4.0)
RBC: 4.91 x10E6/uL (ref 4.14–5.80)
RDW: 13.4 % (ref 12.3–15.4)
Sodium: 142 mmol/L (ref 134–144)
T3 Uptake Ratio: 25 % (ref 24–39)
T4, Total: 4.7 ug/dL (ref 4.5–12.0)
TSH: 0.836 u[IU]/mL (ref 0.450–4.500)
Total Protein: 6.7 g/dL (ref 6.0–8.5)
Triglycerides: 72 mg/dL (ref 0–149)
Uric Acid: 6.1 mg/dL (ref 3.7–8.6)
VLDL Cholesterol Cal: 14 mg/dL (ref 5–40)
WBC: 5.6 10*3/uL (ref 3.4–10.8)

## 2017-02-27 LAB — VITAMIN D 25 HYDROXY (VIT D DEFICIENCY, FRACTURES): Vit D, 25-Hydroxy: 25.6 ng/mL — ABNORMAL LOW (ref 30.0–100.0)

## 2017-02-27 LAB — B12 AND FOLATE PANEL
Folate: 16.6 ng/mL (ref >3.0)
VITAMIN B 12: 193 pg/mL — AB (ref 232–1245)

## 2017-02-27 SURGERY — LOWER EXTREMITY ANGIOGRAPHY
Anesthesia: Moderate Sedation | Laterality: Right

## 2017-03-26 ENCOUNTER — Encounter: Payer: Self-pay | Admitting: Physician Assistant

## 2017-03-26 ENCOUNTER — Ambulatory Visit: Payer: Self-pay | Admitting: Physician Assistant

## 2017-03-26 VITALS — BP 150/92 | HR 74 | Temp 98.4°F

## 2017-03-26 DIAGNOSIS — J0120 Acute ethmoidal sinusitis, unspecified: Secondary | ICD-10-CM

## 2017-03-26 MED ORDER — AMOXICILLIN 875 MG PO TABS: 875.0000 mg | ORAL_TABLET | Freq: Two times a day (BID) | ORAL | 0 refills | Status: DC

## 2017-03-26 MED ORDER — BENZONATATE 200 MG PO CAPS: 200.0000 mg | ORAL_CAPSULE | Freq: Two times a day (BID) | ORAL | 0 refills | Status: DC | PRN

## 2017-03-26 MED ORDER — FEXOFENADINE-PSEUDOEPHED ER 60-120 MG PO TB12: 1.0000 | ORAL_TABLET | Freq: Two times a day (BID) | ORAL | 0 refills | Status: DC

## 2017-03-26 NOTE — [place] Notes (Signed)
   Subjective: Sinus congestion     Patient ID: Mario [name], male    DOB: [name] 31, 1961, 56 y.o.   MRN: [id number]  HPI Patient's present 1 week of sinus congestion. Patient denies fever or chills associated this complaint. Patient has bilateral ear pressure and frontal headache. Patient has a mild cough secondary to postnasal drainage. No relief over-the-counter cough elixir.   Review of Systems    negative except for complaint Objective:   Physical Exam  HEENT is remarkable for edematous nasal turbinates with thick yellow greenish nasal discharge. Pharynx mildly erythematous postnasal drainage. Neck is supple file at that. Lungs clear to auscultation heart is regular rate and rhythm.       Assessment & Plan: Sinusitis   Patient given discharge care instructions and a prescription for Amoxil, fexofenadine, and Tessalon Perles. Patient advised follow-up PCP if condition persists.

## 2017-04-15 ENCOUNTER — Other Ambulatory Visit: Payer: Self-pay | Admitting: Family Medicine

## 2017-04-16 ENCOUNTER — Telehealth: Payer: Self-pay

## 2017-04-16 NOTE — Telephone Encounter (Signed)
Pharmacy is requesting a refill on hctz and potassium cl, Neither medication is listed on pts current list and I do not see where Dr.Nahser discontinued the meds. I called pt to see if he was still taking them but there was no answer, Is patient suppose to be taking them?

## 2017-04-17 NOTE — Telephone Encounter (Signed)
Called patient to discuss HCTZ and potassium Rx. Patient states he would like to hold off on refilling the medications until he sees Dr. Acie Fredrickson. He states he has been taking the HCTZ but not the K+. He requests a soon appointment to discuss the need for vascular surgery. I scheduled him for Thursday 1/10 and he states he will bring recent lab work with his K+ level included. He thanked me for the call.

## 2017-05-01 ENCOUNTER — Encounter: Payer: Self-pay | Admitting: Cardiovascular Disease

## 2017-05-01 ENCOUNTER — Ambulatory Visit: Payer: Managed Care, Other (non HMO) | Admitting: Cardiovascular Disease

## 2017-05-01 VITALS — BP 196/96 | HR 65 | Ht 69.0 in | Wt 228.4 lb

## 2017-05-01 DIAGNOSIS — I739 Peripheral vascular disease, unspecified: Secondary | ICD-10-CM | POA: Diagnosis not present

## 2017-05-01 DIAGNOSIS — I2510 Atherosclerotic heart disease of native coronary artery without angina pectoris: Secondary | ICD-10-CM | POA: Diagnosis not present

## 2017-05-01 MED ORDER — CLOPIDOGREL BISULFATE 75 MG PO TABS: 75.0000 mg | ORAL_TABLET | Freq: Every day | ORAL | 3 refills | Status: DC

## 2017-05-01 MED ORDER — IRBESARTAN 150 MG PO TABS: 150.0000 mg | ORAL_TABLET | Freq: Every day | ORAL | 3 refills | Status: DC

## 2017-05-01 MED ORDER — EZETIMIBE 10 MG PO TABS: 10.0000 mg | ORAL_TABLET | Freq: Every day | ORAL | 3 refills | Status: DC

## 2017-05-01 MED ORDER — ROSUVASTATIN CALCIUM 5 MG PO TABS: 5.0000 mg | ORAL_TABLET | ORAL | 3 refills | Status: DC

## 2017-05-01 NOTE — Patient Instructions (Signed)
Medication Instructions:  Your physician recommends that you continue on your current medications as directed. Please refer to the Current Medication list given to you today.   Labwork: None Ordered   Testing/Procedures: None Ordered   Follow-Up: You have been referred [facility] [place] - Dr. Trula Slade or Dr. Scot Dock   Your physician wants you to follow-up in: 6 months with Dr. Acie Fredrickson.  You will receive a reminder letter in the mail two months in advance. If you don't receive a letter, please call our office to schedule the follow-up appointment.   If you need a refill on your cardiac medications before your next appointment, please call your pharmacy.   Thank you for choosing [facility]! Christen Bame, RN [phone]

## 2017-05-01 NOTE — [place] Notes (Signed)
Mario [name] Date of Birth  May 22, 1960       [facility]     [address] [place]   [place], [place]  [address]    [phone]      Fax  [phone]      Problem List: 1.  Coronary artery disease-status post PTCA and stenting in 2004 2.  Hyperlipidemia 3. Hypertension     Mario [name] is a 57 yo with hx of CAD.,  He's had an upper respiratory tract viral infection for the past month or so.  He denies any chest pain.  He has been able to do of his normal activities without any significant problems. He works at the [employer] in [place].  [name]. 26, 2015: Mario [name] is doing well.  No angina.   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   BP has been elevated .  He has been tried on HCTZ in the past but it made him feel poorly.    August 17, 2013:  November 16, 2013: Bodies doing well from a cardiac standpoint. His medical doctor recently changed his Nexium to products because of the interaction and potential in addition of Plavix metabolism. He's noted that he's had lots more indigestion and heartburn since stopping the Nexium.  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 We discussed checking a P2 Y  12 assay with him on Plavix and Nexium to prove that he is still inhibited.  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 He notes that he bleeds quite readily if he cuts himself.  November 06, 2015: Mario [name] is seen after a 2 year absence. He has a history of coronary artery disease and hypertension BP has been elevated  Still eats some salty foods.  No CP,  Still very active   July 01, 2016:  Mario [name] has been having some problems with elevated blood pressure recently.   He had a question of some mental status changes March  1. .? TIA symptoms .  Slurring works, [name]  ,   BP was better  170/100. No further confusion .    Has gained 20 lbs over the winter.   Not getting any exercise.   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, 2018:  Mario [name] is seen today . Having right calf claudication  ABIs look great.  Dr. Lucky Cowboy wants to do a PV angiogram  Mario [name] wants a 2nd opinion.    Current Outpatient Medications on File Prior to Visit  Medication Sig Dispense Refill  . aspirin 81 MG tablet Take 81 mg daily by mouth.     . clopidogrel (PLAVIX) 75 MG tablet TAKE 1 TABLET BY MOUTH  DAILY 90 tablet 0  . ezetimibe (ZETIA) 10 MG tablet TAKE 1 TABLET BY MOUTH  DAILY 90 tablet 0  . irbesartan (AVAPRO) 150 MG tablet Take 150 mg daily by mouth.    . rosuvastatin (CRESTOR) 5 MG tablet Take 1 tablet (5 mg total) by mouth every other day. 45 tablet 3  No current facility-administered medications on file prior to visit.     Allergies  Allergen Reactions  . Crestor [Rosuvastatin Calcium]     Joint pain  . Lipitor [Atorvastatin Calcium]     Joint pain  . Zocor [Simvastatin]     Joint pain    Past Medical History:  Diagnosis Date  . Arthritis   . CAD (coronary artery disease)   . HLD (hyperlipidemia)   . Recurrent chest pain   . Sleep apnea    cpap    Past Surgical History:  Procedure Laterality Date  . COLONOSCOPY WITH PROPOFOL N/A 11/17/2015   Procedure: COLONOSCOPY WITH PROPOFOL;  Surgeon: Lucilla Lame, MD;  Location: [facility];  Service: Endoscopy;  Laterality: N/A;  CPAP  . NASAL SINUS SURGERY    . POLYPECTOMY N/A 11/17/2015   Procedure: POLYPECTOMY;  Surgeon: Lucilla Lame, MD;  Location: [facility];  Service: Endoscopy;  Laterality: N/A;  sigmoid polyp  . PTCA     3 stents    Social History   Tobacco Use  Smoking Status Former  Smoker  . Last attempt to quit: 04/22/2001  . Years since quitting: 16.0  Smokeless Tobacco Current User  . Types: Snuff    Social History   Substance and Sexual Activity  Alcohol Use Yes   Comment: 2 beers per day    Family History  Problem Relation Age of Onset  . Hyperlipidemia Mother   . Hypertension Mother   . Heart disease Mother   . Heart failure Mother        12  . Lung cancer Mother   . Leukemia Father   . Hypertension Sister   . Hypertension Sister   . Coronary artery disease Unknown     Reviw of Systems:  Reviewed in the HPI.  All other systems are negative.  Physical Exam: Blood pressure (!) 196/96, pulse 65, height 5\' 9"  (1.753 m), weight 228 lb 6.4 oz (103.6 kg), SpO2 96 %.  GEN:  Well nourished, well developed in no acute distress HEENT: Normal NECK: No JVD; No carotid bruits LYMPHATICS: No lymphadenopathy CARDIAC: RR , normal S1S2 ,  Poor right foot pulses.  RESPIRATORY:  Clear to auscultation without rales, wheezing or rhonchi  ABDOMEN: Soft, non-tender, non-distended MUSCULOSKELETAL:  No edema; No deformity  SKIN: Warm and dry NEUROLOGIC:  Alert and oriented x 3   ECG:   May 01, 2017: Normal sinus rhythm at 65.  EKG is normal.  Assessment / Plan:   1.  Peripheral vascular disease: Mario [name] presents with claudication of his right calf.  He has poor right foot pulses.  He is seen the vascular surgeon at [facility] but would like to see 1 of our surgeons here.  We will refer him to [facility] .    2.  Coronary artery disease: He has a history of stenting in the past.  He is not having any episodes of angina.  3.  Hyperlipidemia: His lipid levels are minimally elevated.  He seems to tolerate Crestor 5 mg every other day but still has lots of muscle aches and pains.  We once again talked about the PC SK 9 inhibitors. He is concerned that he may not be able to give himself the injection.  I think it will be important for [place] to improve his lipid control.  We will  talk about this further. I have advised him to watch his intake of carbohydrates and fats and to try to exercise more.  2. Possible  TIA:     I'll see him again in  6 months    Mertie Moores, MD  05/01/2017 11:04 AM    [address],  [address] [place], [place]  [address] Pager [phone] Phone: [phone]; Fax: [phone]

## 2017-05-21 ENCOUNTER — Other Ambulatory Visit: Payer: Self-pay | Admitting: Cardiovascular Disease

## 2017-05-21 MED ORDER — CLOPIDOGREL BISULFATE 75 MG PO TABS: 75.0000 mg | ORAL_TABLET | Freq: Every day | ORAL | 3 refills | Status: DC

## 2017-05-21 MED ORDER — EZETIMIBE 10 MG PO TABS: 10.0000 mg | ORAL_TABLET | Freq: Every day | ORAL | 3 refills | Status: DC

## 2017-05-21 NOTE — Telephone Encounter (Signed)
Medication sent to wrong pharmacy. Medication was sent to the correct pharmacy [facility] home delivery. Confirmation received.

## 2017-05-23 ENCOUNTER — Telehealth: Payer: Self-pay | Admitting: *Deleted

## 2017-05-23 NOTE — Telephone Encounter (Signed)
Spoke with Melissa at [name] who states Irbesartan 150 mg tablets are on back order. She states that the only strength of Irbesartan that is available is 300 mg. I advised that we are hesitant to switch the patient's medication at this time because he recently switched from Valsartan due to recall. She states this is the first request for medication fill from the patient, he is new to their service. I advised that I will contact the patient to find out if medication is needed at this time and if necessary, we will send a new Rx for Irbesartan 300 mg, take 1/2 tab daily. She verbalized understanding and I thanked her for her help.

## 2017-05-23 NOTE — Telephone Encounter (Signed)
Received a fax from [facility] home delivery indicating that irbesartan is unavailable. They are requesting an alternative. Please advise. Thanks, MI

## 2017-05-26 ENCOUNTER — Ambulatory Visit (HOSPITAL_COMMUNITY)
Admission: RE | Admit: 2017-05-26 | Discharge: 2017-05-26 | Disposition: A | Discharge status: Home / Self Care | Payer: Managed Care, Other (non HMO) | Source: Ambulatory Visit | Attending: Surgery | Admitting: Surgery

## 2017-05-26 ENCOUNTER — Ambulatory Visit: Payer: Managed Care, Other (non HMO) | Admitting: Surgery

## 2017-05-26 ENCOUNTER — Encounter: Payer: Self-pay | Admitting: Surgery

## 2017-05-26 ENCOUNTER — Ambulatory Visit (INDEPENDENT_AMBULATORY_CARE_PROVIDER_SITE_OTHER)
Admission: RE | Admit: 2017-05-26 | Discharge: 2017-05-26 | Disposition: A | Discharge status: Home / Self Care | Payer: Managed Care, Other (non HMO) | Source: Ambulatory Visit | Attending: Surgery | Admitting: Surgery

## 2017-05-26 VITALS — BP 150/97 | HR 84 | Temp 97.8°F | Resp 16 | Ht 69.0 in | Wt 224.0 lb

## 2017-05-26 DIAGNOSIS — I70219 Atherosclerosis of native arteries of extremities with intermittent claudication, unspecified extremity: Secondary | ICD-10-CM | POA: Diagnosis not present

## 2017-05-26 DIAGNOSIS — Z720 Tobacco use: Secondary | ICD-10-CM

## 2017-05-26 DIAGNOSIS — I739 Peripheral vascular disease, unspecified: Secondary | ICD-10-CM

## 2017-05-26 DIAGNOSIS — I771 Stricture of artery: Secondary | ICD-10-CM | POA: Insufficient documentation

## 2017-05-26 NOTE — Progress Notes (Signed)
Requested by:  [name], MD [address] [place], [place] [address]  Reason for consultation: second opinion for right leg claudication   History of Present Illness   Mario [name] is a 57 y.o. (12-24-1960) male who presents with chief complaint: Right leg pain with walking.  Onset of symptoms occurred about 1 year ago.  Pain is described as cramping, severity 10/10, and associated with walking.   The patient does not have rest pain or ischemic tissue changes.  He had been evaluated by Dr. Lucky Cowboy.  R resting ABI is within normal limits with R TBI of 0.61.  He was scheduled for RLE arteriogram however cancelled this procedure and asked his Cardiologist for a referral for a second opinion.  Atherosclerotic risk factors include: tobacco use, HTN, hyperlipidemia, CAD with history of 3 coronary stents.  Past Medical History:  Diagnosis Date  . Arthritis   . CAD (coronary artery disease)   . HLD (hyperlipidemia)   . Recurrent chest pain   . Sleep apnea    cpap    Past Surgical History:  Procedure Laterality Date  . COLONOSCOPY WITH PROPOFOL N/A 11/17/2015   Procedure: COLONOSCOPY WITH PROPOFOL;  Surgeon: Lucilla Lame, MD;  Location: [facility];  Service: Endoscopy;  Laterality: N/A;  CPAP  . NASAL SINUS SURGERY    . POLYPECTOMY N/A 11/17/2015   Procedure: POLYPECTOMY;  Surgeon: Lucilla Lame, MD;  Location: [facility];  Service: Endoscopy;  Laterality: N/A;  sigmoid polyp  . PTCA     3 stents    Social History   Socioeconomic History  . Marital status: Married    Spouse name: Not on file  . Number of children: 3  . Years of education: 80  . Highest education level: Not on file  Social Needs  . Financial resource strain: Not on file  . Food insecurity - worry: Not on file  . Food insecurity - inability: Not on file  . Transportation needs - medical: Not on file  . Transportation needs - non-medical: Not on file  Occupational History  . Not on file    Tobacco Use  . Smoking status: Former Smoker    Last attempt to quit: 04/22/2001    Years since quitting: 16.1  . Smokeless tobacco: Current User    Types: Snuff  Substance and Sexual Activity  . Alcohol use: Yes    Comment: 2 beers per day  . Drug use: No  . Sexual activity: Not on file  Other Topics Concern  . Not on file  Social History Narrative   Lives at home with his wife.   Right-handed.   No daily caffeine use.    Family History  Problem Relation Age of Onset  . Hyperlipidemia Mother   . Hypertension Mother   . Heart disease Mother   . Heart failure Mother        61  . Lung cancer Mother   . Leukemia Father   . Hypertension Sister   . Hypertension Sister   . Coronary artery disease Unknown     Current Outpatient Medications  Medication Sig Dispense Refill  . aspirin 81 MG tablet Take 81 mg daily by mouth.     . clopidogrel (PLAVIX) 75 MG tablet Take 1 tablet (75 mg total) by mouth daily. 90 tablet 3  . ezetimibe (ZETIA) 10 MG tablet Take 1 tablet (10 mg total) by mouth daily. 90 tablet 3  . irbesartan (AVAPRO) 150 MG  tablet Take 1 tablet (150 mg total) by mouth daily. 90 tablet 3  . rosuvastatin (CRESTOR) 5 MG tablet Take 1 tablet (5 mg total) by mouth every other day. 45 tablet 3   No current facility-administered medications for this visit.     Allergies  Allergen Reactions  . Crestor [Rosuvastatin Calcium]     Joint pain.  He is able to take a low dose every other day.   . Lipitor [Atorvastatin Calcium]     Joint pain  . Zocor [Simvastatin]     Joint pain    REVIEW OF SYSTEMS (negative unless checked):   Cardiac:  []  Chest pain or chest pressure? []  Shortness of breath upon activity? []  Shortness of breath when lying flat? []  Irregular heart rhythm?  Vascular:  [x]  Pain in calf, thigh, or hip brought on by walking? []  Pain in feet at night that wakes you up from your sleep? []  Blood clot in your veins? []  Leg swelling?  Pulmonary:  []   Oxygen at home? []  Productive cough? []  Wheezing?  Neurologic:  []  Sudden weakness in arms or legs? []  Sudden numbness in arms or legs? []  Sudden onset of difficult speaking or slurred speech? []  Temporary loss of vision in one eye? []  Problems with dizziness?  Gastrointestinal:  []  Blood in stool? []  Vomited blood?  Genitourinary:  []  Burning when urinating? []  Blood in urine?  Psychiatric:  []  Major depression  Hematologic:  []  Bleeding problems? []  Problems with blood clotting?  Dermatologic:  []  Rashes or ulcers?  Constitutional:  []  Fever or chills?  Ear/Nose/Throat:  []  Change in hearing? []  Nose bleeds? []  Sore throat?  Musculoskeletal:  []  Back pain? []  Joint pain? []  Muscle pain?   For [facility] Use Only   PRE-ADM LIVING Home  AMB STATUS Ambulatory  CAD Sx None  PRIOR CHF None      Physical Examination     Vitals:   05/26/17 1254  BP: (!) 150/97  Pulse: 84  Resp: 16  Temp: 97.8 F (36.6 C)  TempSrc: Oral  SpO2: 95%  Weight: 224 lb (101.6 kg)  Height: 5\' 9"  (1.753 m)   Body mass index is 33.08 kg/m.  General Alert, O x 3, WD, NAD  Head [place]/AT,    Ear/Nose/ Throat Hearing grossly intact, nares without erythema or drainage, oropharynx without Erythema or Exudate, Mallampati score: 3,   Eyes PERRLA, EOMI,    Neck Supple, mid-line trachea,    Pulmonary Sym exp, good B air movt, CTA B  Cardiac RRR, Nl S1, S2, , No rubs, No S3,S4  Vascular Vessel Right Left  Radial Palpable Palpable  Brachial Palpable Palpable  Carotid Palpable, No Bruit Palpable, No Bruit  Aorta Not palpable N/A  Femoral Palpable Palpable  Popliteal Prominently palpable Prominently palpable  PT Not palpable Palpable  DP Faintly palpable Palpable    Gastro- intestinal soft, non-distended, non-tender to palpation No palpable prominent aortic pulse,    Musculo- skeletal M/S 5/5 throughout  , Extremities without ischemic changes  , No edema present, No visible  varicosities , No Lipodermatosclerosis present  Neurologic Cranial nerves 2-12 intact , Pain and light touch intact in extremities , Motor exam as listed above  Psychiatric Judgement intact, Mood & affect appropriate for pt's clinical situation  Dermatologic See M/S exam for extremity exam, No rashes otherwise noted  Lymphatic  Palpable lymph nodes: None    Non-Invasive Vascular imaging   BLE ABI (exercising) with arterial duplex  R:  ABI: 0.65 (0.28), returns to resting ABI at 3 minutes   Outside Studies/Documentation    pages of outside documents were reviewed including: R ABI wnl R TBI 0.61.    Medical Decision Making   Mario [name] is a 57 y.o. male who presents with: classic right leg intermittent claudication symptoms.   Exercise R ABIs and arterial duplex were performed today which demonstrated CTO R distal SFA with drop from 0.65 to 0.28 exercise ABI  Encouraged tobacco cessation  Patient will be started and titrated on Pletal as directed  Recommended an exercise program which was reviewed with the patient   The patient is currently on a statin: Crestor.  The patient is currently on an anti-platelet: ASA and Plavix.  Follow up in 3 months to recheck claudication symptoms.   Dagoberto Ligas, PA-C [facility] [phone] 05/26/2017  1:21 PM   05/26/2017, 1:21 PM   I agree with the above.  I have seen and evaluated the patient.  We discussed the ultrasound findings today which are consistent with right superficial femoral artery occlusion which is a likely underlying etiology of his symptomatology.  Because of his age and continued tobacco abuse, I will try to treat him medically.  We discussed the details of an exercise program, the addition of Pletal to his medical regimen, and the importance of smoking cessation.  I will have him follow-up in 3 months for follow-up visit.  Annamarie Major

## 2017-05-27 MED ORDER — IRBESARTAN 300 MG PO TABS: 150.0000 mg | ORAL_TABLET | Freq: Every day | ORAL | 3 refills | Status: DC

## 2017-05-27 NOTE — Telephone Encounter (Signed)
Attempted to call patient to discuss Irbesartan, unable to reach patient and no voice mail set up

## 2017-05-27 NOTE — Telephone Encounter (Signed)
Spoke with patient who states he believes he has 1-2 weeks worth of Irbesartan 150 mg at home. I advised that I will send a new Rx for Irbesartan 300  Mg take 1/2 tab daily due to the back order of Irbesartan 150 mg. I advised him to call back if a short-term Rx is needed from a local pharmacy. He verbalized understanding and agreement and thanked me for the call.

## 2017-05-28 LAB — VAS US LOWER EXTREMITY ARTERIAL DUPLEX
RATIBDISTSYS: 83 cm/s
RIGHT POST TIB DIST SYS: -41 cm/s
RPERPSV: 17 cm/s
RSFMPSV: 69 cm/s
RSFPPSV: 103 cm/s
Right super femoral dist sys PSV: -37 cm/s

## 2017-05-29 ENCOUNTER — Telehealth: Payer: Self-pay | Admitting: Nurse Practitioner

## 2017-05-29 NOTE — Telephone Encounter (Signed)
Spoke with Richardson Landry at [facility] about notification received that Irbesartan 300 mg is not available. I advised that I spoke with Melissa on 2/1 and she advised that the 300 mg strength was available. He states that the computer may have shown availability on 2/1 but it is no longer available. I advised that I will check with patient and possibly send Rx to local pharmacy. Richardson Landry states he will place the Rx with them on file. He thanked me for the call.

## 2017-06-02 ENCOUNTER — Other Ambulatory Visit: Payer: Self-pay

## 2017-06-02 DIAGNOSIS — I70219 Atherosclerosis of native arteries of extremities with intermittent claudication, unspecified extremity: Secondary | ICD-10-CM

## 2017-06-02 MED ORDER — LOSARTAN POTASSIUM 50 MG PO TABS: 50.0000 mg | ORAL_TABLET | Freq: Every day | ORAL | 3 refills | Status: DC

## 2017-06-02 NOTE — Telephone Encounter (Signed)
Spoke with patient regarding the difficulty with getting Irbesartan from [facility] home delivery pharmacy. I offered him the options of trying different pharmacies or changing medications. He states he would prefer to stay with [facility] because it is very cost effective for him and he has just been started on an additional medication which will add cost to his prescriptions. I advised that I will send a new Rx for Losartan and for him to monitor the effects when he switches from Irbesartan. I advised him to call back with any questions or concerns. He states he has stopped using tobacco as advised by [facility]. This will likely help his BP to improve as well. He verbalized understanding and agreement with plan and thanked me for the call.

## 2017-06-30 ENCOUNTER — Telehealth: Payer: Self-pay | Admitting: Family Medicine

## 2017-06-30 ENCOUNTER — Ambulatory Visit: Payer: Managed Care, Other (non HMO) | Admitting: Family Medicine

## 2017-06-30 ENCOUNTER — Encounter: Payer: Self-pay | Admitting: Family Medicine

## 2017-06-30 VITALS — BP 150/96 | HR 75 | Temp 98.2°F | Ht 69.0 in | Wt 226.3 lb

## 2017-06-30 DIAGNOSIS — M5441 Lumbago with sciatica, right side: Secondary | ICD-10-CM | POA: Diagnosis not present

## 2017-06-30 MED ORDER — CYCLOBENZAPRINE HCL 10 MG PO TABS: 10.0000 mg | ORAL_TABLET | Freq: Three times a day (TID) | ORAL | 0 refills | Status: DC | PRN

## 2017-06-30 MED ORDER — PREDNISONE 10 MG PO TABS: ORAL_TABLET | ORAL | 0 refills | Status: DC

## 2017-06-30 MED ORDER — PREDNISONE 10 MG PO TABS: 10.0000 mg | ORAL_TABLET | Freq: Every day | ORAL | 0 refills | Status: DC

## 2017-06-30 MED ORDER — TRAMADOL HCL 50 MG PO TABS: 50.0000 mg | ORAL_TABLET | Freq: Three times a day (TID) | ORAL | 0 refills | Status: DC | PRN

## 2017-06-30 NOTE — Telephone Encounter (Signed)
Copied from [place]. Topic: Quick Communication - Rx Refill/Question >> Jun 30, 2017  1:31 PM Neva Seat wrote: Prednisone 10 mg  Pharmacy needing clarification on Rx instructions for pt Rx asap.   [facility] [address] [place] [address] Phone: [phone] Fax: [phone]

## 2017-06-30 NOTE — Progress Notes (Signed)
   BP (!) 150/96   Pulse 75   Temp 98.2 F (36.8 C) (Oral)   Ht 5\' 9"  (1.753 m)   Wt 226 lb 4.8 oz (102.6 kg)   SpO2 99%   BMI 33.42 kg/m    Subjective:    Patient ID: Mario [name], male    DOB: 08/03/1960, 57 y.o.   MRN: [id number]  HPI: COLONEL KRAUSER is a 57 y.o. male  Chief Complaint  Patient presents with  . Back Pain    pt states he has had right side low back pain for 2 weeks. States the pain runs all the way down his leg to his right foot. Denies any pain or burning with urination   Right low back pain radiating down right leg all the way to foot x 2 weeks. States he feels a knotted up muscle in his right low back where the pain starts. Denies known trigger,but often has this back pain without the radiation. Has not been able to sleep or get comfortable throughout duration. Denies incontinence, urinary sxs, saddle paresthesias, fevers, abdominal pain. Has been trying tylenol and heat with no relief.   Relevant past medical, surgical, family and social history reviewed and updated as indicated. Interim medical history since our last visit reviewed. Allergies and medications reviewed and updated.  Review of Systems  Per HPI unless specifically indicated above     Objective:    BP (!) 150/96   Pulse 75   Temp 98.2 F (36.8 C) (Oral)   Ht 5\' 9"  (1.753 m)   Wt 226 lb 4.8 oz (102.6 kg)   SpO2 99%   BMI 33.42 kg/m   Wt Readings from Last 3 Encounters:  06/30/17 226 lb 4.8 oz (102.6 kg)  05/26/17 224 lb (101.6 kg)  05/01/17 228 lb 6.4 oz (103.6 kg)    Physical Exam  Constitutional: He is oriented to person, place, and time. He appears well-developed and well-nourished. No distress.  HENT:  Head: Atraumatic.  Eyes: Conjunctivae are normal. Pupils are equal, round, and reactive to light.  Neck: Normal range of motion. Neck supple.  Cardiovascular: Normal rate, normal heart sounds and intact distal pulses.  Pulmonary/Chest: Effort normal. He has no wheezes.    Musculoskeletal: He exhibits no tenderness (No CVA tenderness b/l. TTP over right lateral low back, muscles there in spasm).  Mildly antalgic gait - SLR b/l  Neurological: He is alert and oriented to person, place, and time.  Neurovascularly intact b/l LEs  Skin: Skin is warm and dry.  Psychiatric: He has a normal mood and affect. His behavior is normal.  Nursing note and vitals reviewed.     Assessment & Plan:   Problem List Items Addressed This Visit    None    Visit Diagnoses    Acute right-sided low back pain with right-sided sciatica    -  Primary   Tx w/ prednisone, flexeril, tylenol, heat, massage, and stretches. Handout of stretches given. Small supply of tramadol for severe pain with strict precautions   Relevant Medications   cyclobenzaprine (FLEXERIL) 10 MG tablet   traMADol (ULTRAM) 50 MG tablet   predniSONE (DELTASONE) 10 MG tablet       Follow up plan: Return if symptoms worsen or fail to improve.

## 2017-06-30 NOTE — Patient Instructions (Addendum)
Piriformis Syndrome Rehab Ask your health care provider which exercises are safe for you. Do exercises exactly as told by your health care provider and adjust them as directed. It is normal to feel mild stretching, pulling, tightness, or discomfort as you do these exercises, but you should stop right away if you feel sudden pain or your pain gets worse.Do not begin these exercises until told by your health care provider. Stretching and range of motion exercises These exercises warm up your muscles and joints and improve the movement and flexibility of your hip and pelvis. These exercises also help to relieve pain, numbness, and tingling. Exercise A: Hip rotators  1. Lie on your back on a firm surface. 2. Pull your left / right knee toward your same shoulder with your left / right hand until your knee is pointing toward the ceiling. Hold your left / right ankle with your other hand. 3. Keeping your knee steady, gently pull your left / right ankle toward your other shoulder until you feel a stretch in your buttocks. 4. Hold this position for __________ seconds. Repeat __________ times. Complete this stretch __________ times a day. Exercise B: Hip extensors 1. Lie on your back on a firm surface. Both of your legs should be straight. 2. Pull your left / right knee to your chest. Hold your leg in this position by holding onto the back of your thigh or the front of your knee. 3. Hold this position for __________ seconds. 4. Slowly return to the starting position. Repeat __________ times. Complete this stretch __________ times a day. Strengthening exercises These exercises build strength and endurance in your hip and thigh muscles. Endurance is the ability to use your muscles for a long time, even after they get tired. Exercise C: Straight leg raises ( hip abductors) 1. Lie on your side with your left / right leg in the top position. Lie so your head, shoulder, knee, and hip line up. Bend your bottom  knee to help you balance. 2. Lift your top leg up 4-6 inches (10-15 cm), keeping your toes pointed straight ahead. 3. Hold this position for __________ seconds. 4. Slowly lower your leg to the starting position. Let your muscles relax completely. Repeat __________ times. Complete this exercise__________ times a day. Exercise D: Hip abductors and rotators, quadruped  1. Get on your hands and knees on a firm, lightly padded surface. Your hands should be directly below your shoulders, and your knees should be directly below your hips. 2. Lift your left / right knee out to the side. Keep your knee bent. Do not twist your body. 3. Hold this position for __________ seconds. 4. Slowly lower your leg. Repeat __________ times. Complete this exercise__________ times a day. Exercise E: Straight leg raises ( hip extensors) 1. Lie on your abdomen on a bed or a firm surface with a pillow under your hips. 2. Squeeze your buttock muscles and lift your left / right thigh off the bed. Do not let your back arch. 3. Hold this position for __________ seconds. 4. Slowly return to the starting position. Let your muscles relax completely before doing another repetition. Repeat __________ times. Complete this exercise__________ times a day. This information is not intended to replace advice given to you by your health care provider. Make sure you discuss any questions you have with your health care provider. Document Released: 04/08/2005 Document Revised: 12/12/2015 Document Reviewed: 03/21/2015 Elsevier Interactive Patient Education  2018 Elsevier Inc.  

## 2017-07-01 NOTE — Telephone Encounter (Signed)
I have tried calling pharmacy twice. It gives busy tone.   Directions are: Sig: Take 6 tabs day one, 5 tabs day two, 4 tabs day three, etc  Take 6 tabs the first day Take 5 tabs the second day Take 4 tabs the third day Take 3 tabs the fourth day Take 2 tabs the fifth day Take 1 tab the sixth day

## 2017-07-02 NOTE — Telephone Encounter (Signed)
Pharmacist states Rx states once a day but patient stated it was supposed to be a taper. She is going to call the patient to see what he's been doing.   They'll call back if anything else is needed further.

## 2017-07-10 ENCOUNTER — Telehealth: Payer: Self-pay | Admitting: Cardiovascular Disease

## 2017-07-10 NOTE — Telephone Encounter (Signed)
New Message    *STAT* If patient is at the pharmacy, call can be transferred to refill team.   1. Which medications need to be refilled? (please list name of each medication and dose if known) Irbesartan  2. Which pharmacy/location (including street and city if local pharmacy) is medication to be sent to? [place], [place]  3. Do they need a 30 day or 90 day supply? [place]

## 2017-07-10 NOTE — Telephone Encounter (Signed)
Called pt to inform him per Sharyn Lull, RN, Dr. Elmarie Shiley nurse, that pt no longer takes irbesartan, pt now take losartan 50 mg tablet, daily and pt stated that he had forgot and that he would start taking losartan, because he has a 90 day supply of this medication already. I advised the pt that if he has any other problems, questions or concerns, to give our office a call back. Pt verbalized understanding.

## 2017-08-06 ENCOUNTER — Other Ambulatory Visit
Admission: RE | Admit: 2017-08-06 | Discharge: 2017-08-06 | Disposition: A | Discharge status: Home / Self Care | Payer: Managed Care, Other (non HMO) | Source: Ambulatory Visit | Attending: Cardiovascular Disease | Admitting: Cardiovascular Disease

## 2017-08-06 ENCOUNTER — Telehealth: Payer: Self-pay | Admitting: Cardiovascular Disease

## 2017-08-06 DIAGNOSIS — Z7901 Long term (current) use of anticoagulants: Secondary | ICD-10-CM

## 2017-08-06 DIAGNOSIS — Z7902 Long term (current) use of antithrombotics/antiplatelets: Secondary | ICD-10-CM | POA: Insufficient documentation

## 2017-08-06 DIAGNOSIS — Z5181 Encounter for therapeutic drug level monitoring: Secondary | ICD-10-CM

## 2017-08-06 LAB — CBC
HCT: 44.6 % (ref 40.0–52.0)
Hemoglobin: 15.3 g/dL (ref 13.0–18.0)
MCH: 31.8 pg (ref 26.0–34.0)
MCHC: 34.3 g/dL (ref 32.0–36.0)
MCV: 92.9 fL (ref 80.0–100.0)
PLATELETS: 247 10*3/uL (ref 150–440)
RBC: 4.81 MIL/uL (ref 4.40–5.90)
RDW: 14 % (ref 11.5–14.5)
WBC: 6.5 10*3/uL (ref 3.8–10.6)

## 2017-08-06 NOTE — Telephone Encounter (Signed)
New message    Patient states he cut his hand 08/05/2017. Has concerns about blood clotting and questions about blood thinner.

## 2017-08-06 NOTE — Telephone Encounter (Signed)
He may have switched to another brand of generic plavix.  He used to bleed quite a bit after cutting himself.  It would be reasonable to check a P2Y12 level . We can check it in the [place] office.   Thanks

## 2017-08-06 NOTE — Telephone Encounter (Signed)
Spoke with patient and advised him to go to [facility] in [place] for lab work because p2y12 test is specific to [employer]. I also advised that he look at Plavix bottle and determine when the manufacturer may have changed. I advised we will call with test results and advice once results are back. He verbalized understanding and agreement and thanked me for the call.

## 2017-08-06 NOTE — Telephone Encounter (Signed)
Spoke with patient who states he is concerned about clotting issues. He states he cut his hand yesterday and as soon as the blood was coming out, it was forming a clot. He states the blood would not drip down his hand at all, states it had the consistency of glue. He states he has not had a recent CBC but is scheduled on 4/29 for an appointment and lab work with [facility] for insurance purposes. He states he has been taking  fish oil and vitamin d supplements and he thought the fish oil would thin the blood also. I advised that there is a blood test for the effectiveness of Plavix and that I will discuss with Dr. Acie Fredrickson. He verbalized understanding that I will call him back.

## 2017-08-07 LAB — HEPARIN INDUCED PLATELET AB (HIT ANTIBODY): Heparin Induced Plt Ab: 0.099 {OD_unit} (ref 0.000–0.400)

## 2017-08-11 NOTE — Telephone Encounter (Signed)
Spoke with Sheral Flow at [facility] lab. She is not aware of how or why the original P2Y12 order was changed to the heparin induced platelet test but she will cancel the requisition so the patient is not charged. She states P2y12 is only done at [facility]. I called and spoke with one of the technicians at [facility] lab who advised patient pick up written order from our office so that order can be entered correctly on their end. I advised patient to pick up order from our office and then go through Admitting at [facility] for registration. Patient verbalized understanding and agreement and thanked me for the call.

## 2017-08-15 ENCOUNTER — Other Ambulatory Visit (HOSPITAL_COMMUNITY)
Admission: RE | Admit: 2017-08-15 | Discharge: 2017-08-15 | Disposition: A | Discharge status: Home / Self Care | Payer: Managed Care, Other (non HMO) | Source: Ambulatory Visit | Attending: Family Medicine | Admitting: Family Medicine

## 2017-08-15 DIAGNOSIS — Z0189 Encounter for other specified special examinations: Secondary | ICD-10-CM | POA: Diagnosis present

## 2017-08-15 LAB — PLATELET INHIBITION P2Y12: PLATELET FUNCTION P2Y12: 29 [PRU] — AB (ref 194–418)

## 2017-08-18 ENCOUNTER — Ambulatory Visit: Payer: Self-pay | Admitting: Family Medicine

## 2017-08-18 VITALS — BP 159/90 | HR 79 | Resp 16 | Ht 69.0 in | Wt 225.0 lb

## 2017-08-18 DIAGNOSIS — Z0189 Encounter for other specified special examinations: Principal | ICD-10-CM

## 2017-08-18 DIAGNOSIS — Z008 Encounter for other general examination: Secondary | ICD-10-CM

## 2017-08-18 LAB — GLUCOSE, POCT (MANUAL RESULT ENTRY): POC GLUCOSE: 93 mg/dL (ref 70–99)

## 2017-08-18 NOTE — Telephone Encounter (Signed)
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

## 2017-08-18 NOTE — Telephone Encounter (Signed)
Patient is aware that P2Y12 result shows appropriate coagulation on Plavix

## 2017-08-18 NOTE — Progress Notes (Signed)
Subjective: Annual biometrics screening  Patient presents for his annual biometric screening. Patient reports his only medical history is hypertension and a history of 3 cardiac stents.  Patient reports his blood pressure is generally well controlled below 140/90 but that he has "white coat syndrome".  Patient reports compliance with medications.  Patient reports a history of allergic rhinitis, which he uses Flonase for without full relief of his symptoms.  Patient reports symptoms of sneezing and rhinorrhea.  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   PCP: Dr. Jeananne Rama.  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 Patient denies any other symptoms or concerns.   Review of Systems Unremarkable  Objective  Physical Exam General: Awake, alert and oriented. No acute distress. Well developed, hydrated and nourished. Appears stated age.  HEENT: Supple neck without adenopathy. Sclera is non-icteric. The ear canal is clear without discharge. The tympanic membrane is normal in appearance with normal landmarks and cone of light. Nasal mucosa is pink and moist. Oral mucosa is pink and moist. The pharynx is normal in appearance without tonsillar swelling or exudates.  Skin: Skin in warm, dry and intact without rashes or lesions. Appropriate color for ethnicity.  Cardiac: Heart rate and rhythm are normal.  Normal S1 and S2. Respiratory: The chest wall is symmetric and without deformity. No signs of respiratory distress. Lung sounds are clear in all lobes bilaterally without rales, ronchi, or wheezes.  Neurological: The patient is awake, alert and oriented to person, place, and time with normal speech.  Memory is normal and thought processes intact. No gait abnormalities are appreciated.  Psychiatric: Appropriate mood and affect.   Assessment Annual biometrics screening  Plan  Lipid panel pending. Encouraged routine visits with primary care provider.  Fasting blood sugar  is 93 today. Discussed OTC treatments for allergic rhinitis symptoms. Advised patient to monitor his blood pressure at home and to follow-up with his primary care provider if it continues to be elevated.  Discussed normal values.

## 2017-08-18 NOTE — Progress Notes (Signed)
89

## 2017-08-19 ENCOUNTER — Other Ambulatory Visit: Payer: Self-pay

## 2017-08-19 ENCOUNTER — Encounter: Payer: Self-pay | Admitting: Emergency Medicine

## 2017-08-19 ENCOUNTER — Emergency Department: Payer: Managed Care, Other (non HMO)

## 2017-08-19 ENCOUNTER — Inpatient Hospital Stay
Admission: EM | Admit: 2017-08-19 | Discharge: 2017-08-20 | DRG: 247 | Disposition: A | Discharge status: Home / Self Care | Payer: Managed Care, Other (non HMO) | Attending: Internal Medicine | Admitting: Internal Medicine

## 2017-08-19 ENCOUNTER — Encounter
Admission: EM | Disposition: A | Discharge status: Home / Self Care | Payer: Self-pay | Source: Home / Self Care | Attending: Internal Medicine

## 2017-08-19 DIAGNOSIS — I252 Old myocardial infarction: Secondary | ICD-10-CM | POA: Diagnosis not present

## 2017-08-19 DIAGNOSIS — Z87891 Personal history of nicotine dependence: Secondary | ICD-10-CM

## 2017-08-19 DIAGNOSIS — M1990 Unspecified osteoarthritis, unspecified site: Secondary | ICD-10-CM | POA: Diagnosis present

## 2017-08-19 DIAGNOSIS — I25110 Atherosclerotic heart disease of native coronary artery with unstable angina pectoris: Secondary | ICD-10-CM | POA: Diagnosis present

## 2017-08-19 DIAGNOSIS — Y712 Prosthetic and other implants, materials and accessory cardiovascular devices associated with adverse incidents: Secondary | ICD-10-CM | POA: Diagnosis present

## 2017-08-19 DIAGNOSIS — I200 Unstable angina: Secondary | ICD-10-CM

## 2017-08-19 DIAGNOSIS — I739 Peripheral vascular disease, unspecified: Secondary | ICD-10-CM | POA: Diagnosis present

## 2017-08-19 DIAGNOSIS — Z888 Allergy status to other drugs, medicaments and biological substances status: Secondary | ICD-10-CM

## 2017-08-19 DIAGNOSIS — Z7982 Long term (current) use of aspirin: Secondary | ICD-10-CM

## 2017-08-19 DIAGNOSIS — I10 Essential (primary) hypertension: Secondary | ICD-10-CM | POA: Diagnosis not present

## 2017-08-19 DIAGNOSIS — Z955 Presence of coronary angioplasty implant and graft: Secondary | ICD-10-CM | POA: Diagnosis not present

## 2017-08-19 DIAGNOSIS — Z79899 Other long term (current) drug therapy: Secondary | ICD-10-CM

## 2017-08-19 DIAGNOSIS — R079 Chest pain, unspecified: Secondary | ICD-10-CM | POA: Diagnosis present

## 2017-08-19 DIAGNOSIS — Z8673 Personal history of transient ischemic attack (TIA), and cerebral infarction without residual deficits: Secondary | ICD-10-CM | POA: Diagnosis not present

## 2017-08-19 DIAGNOSIS — E785 Hyperlipidemia, unspecified: Secondary | ICD-10-CM | POA: Diagnosis not present

## 2017-08-19 DIAGNOSIS — G4733 Obstructive sleep apnea (adult) (pediatric): Secondary | ICD-10-CM | POA: Diagnosis present

## 2017-08-19 DIAGNOSIS — Y838 Other surgical procedures as the cause of abnormal reaction of the patient, or of later complication, without mention of misadventure at the time of the procedure: Secondary | ICD-10-CM | POA: Diagnosis present

## 2017-08-19 DIAGNOSIS — Z720 Tobacco use: Secondary | ICD-10-CM | POA: Diagnosis not present

## 2017-08-19 DIAGNOSIS — T82855A Stenosis of coronary artery stent, initial encounter: Principal | ICD-10-CM | POA: Diagnosis present

## 2017-08-19 DIAGNOSIS — R200 Anesthesia of skin: Secondary | ICD-10-CM | POA: Diagnosis present

## 2017-08-19 DIAGNOSIS — Z7902 Long term (current) use of antithrombotics/antiplatelets: Secondary | ICD-10-CM

## 2017-08-19 DIAGNOSIS — Z9989 Dependence on other enabling machines and devices: Secondary | ICD-10-CM | POA: Diagnosis not present

## 2017-08-19 HISTORY — PX: INTRAVASCULAR PRESSURE WIRE/FFR STUDY: CATH118243

## 2017-08-19 HISTORY — PX: CORONARY STENT INTERVENTION: CATH118234

## 2017-08-19 HISTORY — PX: LEFT HEART CATH AND CORONARY ANGIOGRAPHY: CATH118249

## 2017-08-19 LAB — HEPATIC FUNCTION PANEL
ALK PHOS: 56 U/L (ref 38–126)
ALT: 30 U/L (ref 17–63)
AST: 27 U/L (ref 15–41)
Albumin: 4.2 g/dL (ref 3.5–5.0)
Bilirubin, Direct: <0.1 mg/dL — ABNORMAL LOW (ref 0.1–0.5)
TOTAL PROTEIN: 6.6 g/dL (ref 6.5–8.1)
Total Bilirubin: 0.6 mg/dL (ref 0.3–1.2)

## 2017-08-19 LAB — BASIC METABOLIC PANEL
Anion gap: 9 (ref 5–15)
BUN: 11 mg/dL (ref 6–20)
CO2: 24 mmol/L (ref 22–32)
Calcium: 8.8 mg/dL — ABNORMAL LOW (ref 8.9–10.3)
Chloride: 104 mmol/L (ref 101–111)
Creatinine, Ser: 0.99 mg/dL (ref 0.61–1.24)
GLUCOSE: 96 mg/dL (ref 65–99)
POTASSIUM: 4.1 mmol/L (ref 3.5–5.1)
Sodium: 137 mmol/L (ref 135–145)

## 2017-08-19 LAB — LIPID PANEL
Chol/HDL Ratio: 2.1 ratio (ref 0.0–5.0)
Cholesterol, Total: 185 mg/dL (ref 100–199)
HDL: 88 mg/dL (ref >39)
LDL Calculated: 86 mg/dL (ref 0–99)
Triglycerides: 57 mg/dL (ref 0–149)
VLDL Cholesterol Cal: 11 mg/dL (ref 5–40)

## 2017-08-19 LAB — CBC
HEMATOCRIT: 42.9 % (ref 40.0–52.0)
HEMOGLOBIN: 14.6 g/dL (ref 13.0–18.0)
MCH: 31.3 pg (ref 26.0–34.0)
MCHC: 34.1 g/dL (ref 32.0–36.0)
MCV: 91.9 fL (ref 80.0–100.0)
Platelets: 213 10*3/uL (ref 150–440)
RBC: 4.67 MIL/uL (ref 4.40–5.90)
RDW: 13.4 % (ref 11.5–14.5)
WBC: 6.1 10*3/uL (ref 3.8–10.6)

## 2017-08-19 LAB — PROTIME-INR
INR: 0.91
Prothrombin Time: 12.2 seconds (ref 11.4–15.2)

## 2017-08-19 LAB — TROPONIN I: Troponin I: <0.03 ng/mL (ref <0.03)

## 2017-08-19 LAB — POCT ACTIVATED CLOTTING TIME
ACTIVATED CLOTTING TIME: 285 s
Activated Clotting Time: 279 seconds

## 2017-08-19 LAB — LIPASE, BLOOD: Lipase: 26 U/L (ref 11–51)

## 2017-08-19 LAB — APTT: APTT: 29 s (ref 24–36)

## 2017-08-19 SURGERY — LEFT HEART CATH AND CORONARY ANGIOGRAPHY
Anesthesia: Moderate Sedation

## 2017-08-19 MED ORDER — SENNOSIDES-DOCUSATE SODIUM 8.6-50 MG PO TABS: 1.0000 | ORAL_TABLET | Freq: Every evening | ORAL | Status: DC | PRN

## 2017-08-19 MED ORDER — MIDAZOLAM HCL 2 MG/2ML IJ SOLN
INTRAMUSCULAR | Status: AC
  Filled 2017-08-19: qty 2

## 2017-08-19 MED ORDER — SODIUM CHLORIDE 0.9% FLUSH: 3.0000 mL | Freq: Two times a day (BID) | INTRAVENOUS | Status: DC

## 2017-08-19 MED ORDER — ACETAMINOPHEN 325 MG PO TABS
650.0000 mg | ORAL_TABLET | ORAL | Status: DC | PRN
  Administered 2017-08-19 – 2017-08-20 (×3): 650 mg via ORAL
  Filled 2017-08-19 (×2): qty 2

## 2017-08-19 MED ORDER — CLOPIDOGREL BISULFATE 75 MG PO TABS
ORAL_TABLET | ORAL | Status: AC
  Filled 2017-08-19: qty 4

## 2017-08-19 MED ORDER — HEPARIN SODIUM (PORCINE) 1000 UNIT/ML IJ SOLN
INTRAMUSCULAR | Status: DC | PRN
  Administered 2017-08-19: 2000 [IU] via INTRAVENOUS
  Administered 2017-08-19 (×2): 5000 [IU] via INTRAVENOUS
  Administered 2017-08-19: 2000 [IU] via INTRAVENOUS

## 2017-08-19 MED ORDER — HEPARIN (PORCINE) IN NACL 1000-0.9 UT/500ML-% IV SOLN
INTRAVENOUS | Status: AC
  Filled 2017-08-19: qty 500

## 2017-08-19 MED ORDER — MORPHINE SULFATE (PF) 2 MG/ML IV SOLN: 2.0000 mg | INTRAVENOUS | Status: DC | PRN

## 2017-08-19 MED ORDER — SODIUM CHLORIDE 0.9 % IV SOLN: 250.0000 mL | INTRAVENOUS | Status: DC | PRN

## 2017-08-19 MED ORDER — HEPARIN SODIUM (PORCINE) 5000 UNIT/ML IJ SOLN
4000.0000 [IU] | Freq: Once | INTRAMUSCULAR | Status: AC
  Administered 2017-08-19: 4000 [IU] via INTRAVENOUS
  Filled 2017-08-19: qty 1

## 2017-08-19 MED ORDER — CLOPIDOGREL BISULFATE 75 MG PO TABS
75.0000 mg | ORAL_TABLET | Freq: Every day | ORAL | Status: DC
  Administered 2017-08-20: 75 mg via ORAL
  Filled 2017-08-19: qty 1

## 2017-08-19 MED ORDER — ROSUVASTATIN CALCIUM 5 MG PO TABS: 5.0000 mg | ORAL_TABLET | ORAL | Status: DC

## 2017-08-19 MED ORDER — LABETALOL HCL 5 MG/ML IV SOLN: 10.0000 mg | INTRAVENOUS | Status: AC | PRN

## 2017-08-19 MED ORDER — HEPARIN (PORCINE) IN NACL 100-0.45 UNIT/ML-% IJ SOLN
1200.0000 [IU]/h | INTRAMUSCULAR | Status: DC
  Administered 2017-08-19: 1200 [IU]/h via INTRAVENOUS
  Filled 2017-08-19: qty 250

## 2017-08-19 MED ORDER — MIDAZOLAM HCL 2 MG/2ML IJ SOLN
INTRAMUSCULAR | Status: DC | PRN
  Administered 2017-08-19 (×2): 1 mg via INTRAVENOUS

## 2017-08-19 MED ORDER — ADENOSINE (DIAGNOSTIC) 140MCG/KG/MIN
INTRAVENOUS | Status: AC | PRN
  Administered 2017-08-19: 140 ug/kg/min via INTRAVENOUS

## 2017-08-19 MED ORDER — MORPHINE SULFATE (PF) 4 MG/ML IV SOLN: 4.0000 mg | INTRAVENOUS | Status: DC | PRN

## 2017-08-19 MED ORDER — HYDRALAZINE HCL 20 MG/ML IJ SOLN: 5.0000 mg | INTRAMUSCULAR | Status: AC | PRN

## 2017-08-19 MED ORDER — CLOPIDOGREL BISULFATE 75 MG PO TABS: 75.0000 mg | ORAL_TABLET | Freq: Every day | ORAL | Status: DC

## 2017-08-19 MED ORDER — NITROGLYCERIN 0.4 MG SL SUBL: 0.4000 mg | SUBLINGUAL_TABLET | SUBLINGUAL | Status: DC | PRN

## 2017-08-19 MED ORDER — ENOXAPARIN SODIUM 40 MG/0.4ML ~~LOC~~ SOLN
40.0000 mg | SUBCUTANEOUS | Status: DC
  Administered 2017-08-20: 40 mg via SUBCUTANEOUS
  Filled 2017-08-19: qty 0.4

## 2017-08-19 MED ORDER — IOPAMIDOL (ISOVUE-300) INJECTION 61%
INTRAVENOUS | Status: DC | PRN
  Administered 2017-08-19: 155 mL via INTRA_ARTERIAL

## 2017-08-19 MED ORDER — FENTANYL CITRATE (PF) 100 MCG/2ML IJ SOLN
INTRAMUSCULAR | Status: DC | PRN
  Administered 2017-08-19: 25 ug via INTRAVENOUS
  Administered 2017-08-19: 50 ug via INTRAVENOUS

## 2017-08-19 MED ORDER — VERAPAMIL HCL 2.5 MG/ML IV SOLN
INTRAVENOUS | Status: DC | PRN
  Administered 2017-08-19: 2.5 mg via INTRA_ARTERIAL

## 2017-08-19 MED ORDER — SODIUM CHLORIDE 0.9% FLUSH: 3.0000 mL | INTRAVENOUS | Status: DC | PRN

## 2017-08-19 MED ORDER — HEPARIN SODIUM (PORCINE) 1000 UNIT/ML IJ SOLN
INTRAMUSCULAR | Status: AC
Start: 2017-08-19 — End: 2017-08-19
  Filled 2017-08-19: qty 1

## 2017-08-19 MED ORDER — VERAPAMIL HCL 2.5 MG/ML IV SOLN
INTRAVENOUS | Status: AC
  Filled 2017-08-19: qty 2

## 2017-08-19 MED ORDER — ACETAMINOPHEN 325 MG PO TABS
ORAL_TABLET | ORAL | Status: AC
  Filled 2017-08-19: qty 2

## 2017-08-19 MED ORDER — SODIUM CHLORIDE 0.9 % IV SOLN
INTRAVENOUS | Status: DC
  Administered 2017-08-19: 09:00:00 via INTRAVENOUS

## 2017-08-19 MED ORDER — CILOSTAZOL 100 MG PO TABS
100.0000 mg | ORAL_TABLET | Freq: Every day | ORAL | Status: DC
  Administered 2017-08-20: 100 mg via ORAL
  Filled 2017-08-19 (×2): qty 1

## 2017-08-19 MED ORDER — NITROGLYCERIN 5 MG/ML IV SOLN
INTRAVENOUS | Status: AC
  Filled 2017-08-19: qty 10

## 2017-08-19 MED ORDER — ADENOSINE (DIAGNOSTIC) 3 MG/ML IV SOLN
INTRAVENOUS | Status: AC
  Filled 2017-08-19: qty 30

## 2017-08-19 MED ORDER — GUAIFENESIN-DM 100-10 MG/5ML PO SYRP
5.0000 mL | ORAL_SOLUTION | ORAL | Status: DC | PRN
  Administered 2017-08-19 – 2017-08-20 (×2): 5 mL via ORAL
  Filled 2017-08-19 (×2): qty 5

## 2017-08-19 MED ORDER — OMEGA-3-ACID ETHYL ESTERS 1 G PO CAPS
1.0000 g | ORAL_CAPSULE | Freq: Every day | ORAL | Status: DC
  Administered 2017-08-20: 1 g via ORAL
  Filled 2017-08-19: qty 1

## 2017-08-19 MED ORDER — ALUM & MAG HYDROXIDE-SIMETH 200-200-20 MG/5ML PO SUSP
30.0000 mL | Freq: Once | ORAL | Status: AC | PRN
  Administered 2017-08-19: 30 mL via ORAL
  Filled 2017-08-19: qty 30

## 2017-08-19 MED ORDER — ASPIRIN 81 MG PO CHEW
324.0000 mg | CHEWABLE_TABLET | Freq: Once | ORAL | Status: AC
  Administered 2017-08-19: 324 mg via ORAL
  Filled 2017-08-19: qty 4

## 2017-08-19 MED ORDER — CLOPIDOGREL BISULFATE 75 MG PO TABS
ORAL_TABLET | ORAL | Status: DC | PRN
  Administered 2017-08-19: 300 mg via ORAL

## 2017-08-19 MED ORDER — BISACODYL 5 MG PO TBEC: 5.0000 mg | DELAYED_RELEASE_TABLET | Freq: Every day | ORAL | Status: DC | PRN

## 2017-08-19 MED ORDER — SODIUM CHLORIDE 0.9 % IV SOLN
INTRAVENOUS | Status: AC
  Administered 2017-08-19: 16:00:00 via INTRAVENOUS

## 2017-08-19 MED ORDER — EZETIMIBE 10 MG PO TABS
10.0000 mg | ORAL_TABLET | Freq: Every day | ORAL | Status: DC
  Administered 2017-08-20: 10 mg via ORAL
  Filled 2017-08-19: qty 1

## 2017-08-19 MED ORDER — LOSARTAN POTASSIUM 50 MG PO TABS
50.0000 mg | ORAL_TABLET | Freq: Every day | ORAL | Status: DC
  Administered 2017-08-20: 50 mg via ORAL
  Filled 2017-08-19: qty 1

## 2017-08-19 MED ORDER — FENTANYL CITRATE (PF) 100 MCG/2ML IJ SOLN
INTRAMUSCULAR | Status: AC
  Filled 2017-08-19: qty 2

## 2017-08-19 MED ORDER — ONDANSETRON HCL 4 MG/2ML IJ SOLN: 4.0000 mg | Freq: Four times a day (QID) | INTRAMUSCULAR | Status: DC | PRN

## 2017-08-19 MED ORDER — SODIUM CHLORIDE 0.9 % IV SOLN: INTRAVENOUS | Status: DC

## 2017-08-19 MED ORDER — ASPIRIN EC 81 MG PO TBEC
81.0000 mg | DELAYED_RELEASE_TABLET | Freq: Every day | ORAL | Status: DC
  Administered 2017-08-20: 81 mg via ORAL
  Filled 2017-08-19: qty 1

## 2017-08-19 MED ORDER — NITROGLYCERIN 1 MG/10 ML FOR IR/CATH LAB
INTRA_ARTERIAL | Status: DC | PRN
  Administered 2017-08-19 (×2): 200 ug via INTRACORONARY

## 2017-08-19 MED ORDER — HEPARIN SODIUM (PORCINE) 1000 UNIT/ML IJ SOLN
INTRAMUSCULAR | Status: AC
  Filled 2017-08-19: qty 1

## 2017-08-19 SURGICAL SUPPLY — 21 items
BALLN TREK RX 2.5X12 (BALLOONS) ×3
BALLN TREK RX 3.0X12 (BALLOONS) ×3
BALLN ~~LOC~~ TREK RX 3.5X12 (BALLOONS) ×3
BALLN ~~LOC~~ TREK RX 3.5X8 (BALLOONS) ×3
BALLOON TREK RX 2.5X12 (BALLOONS) ×1 IMPLANT
BALLOON TREK RX 3.0X12 (BALLOONS) ×1 IMPLANT
BALLOON ~~LOC~~ TREK RX 3.5X12 (BALLOONS) ×1 IMPLANT
BALLOON ~~LOC~~ TREK RX 3.5X8 (BALLOONS) ×1 IMPLANT
CATH HEARTRAIL 6F IL3.5 (CATHETERS) ×3 IMPLANT
CATH INFINITI 5FR TG (CATHETERS) ×3 IMPLANT
CATH INFINITI JR4 5F (CATHETERS) ×3 IMPLANT
DEVICE INFLAT 30 PLUS (MISCELLANEOUS) ×3 IMPLANT
DEVICE RAD COMP TR BAND LRG (VASCULAR PRODUCTS) ×3 IMPLANT
KIT MANI 3VAL PERCEP (MISCELLANEOUS) ×3 IMPLANT
NEEDLE PERC 21GX4CM (NEEDLE) ×3 IMPLANT
PACK CARDIAC CATH (CUSTOM PROCEDURE TRAY) ×3 IMPLANT
SHEATH RAIN RADIAL 21G 6FR (SHEATH) ×3 IMPLANT
STENT SIERRA 3.00 X 12 MM (Permanent Stent) ×3 IMPLANT
WIRE ASAHI PROWATER 180CM (WIRE) ×3 IMPLANT
WIRE PRESSURE VERRATA (WIRE) ×3 IMPLANT
WIRE ROSEN-J .035X260CM (WIRE) ×3 IMPLANT

## 2017-08-19 NOTE — Progress Notes (Signed)
Patient back from cardiac catherization.  Some old drainage on Right radial gauze.  Radial pulse present.  No chest pain.  Patient NSR, on RA. VSS.

## 2017-08-19 NOTE — ED Provider Notes (Signed)
[facility] Emergency Department Provider Note  ____________________________________________   First MD Initiated Contact with Patient 08/19/17 [phone]     (approximate)  I have reviewed the triage vital signs and the nursing notes.   HISTORY  Chief Complaint Chest Pain    HPI Mario [name] is a 57 y.o. male with a medical history notable for 3 cardiac stents that were reportedly placed in 2004 and who goes to Dr. Acie Fredrickson with [facility] cardiology.  He presents tonight by private vehicle for evaluation of acute onset mid sternal chest pain that radiates into his left arm.  The pain was severe when it started but it has resolved.  He denies shortness of breath, diaphoresis, nausea, vomiting, and abdominal pain.  He states that he has had these symptoms in the past but it is been a long time.  He takes Plavix but no other blood thinners.  He has not been ill recently and he has been compliant with his medications.  Nothing particular makes the pain better nor worse and it is currently gone, but it comes and goes.  He reiterates that this is very much how he fell back in 2004 when he had an MI and got his original stents.  He also notes that he did not have chest pain regularly until about a month ago.  It started mild and intermittent but is gotten more and more frequent until tonight when it is relatively persistent and frequent and severe.  Past Medical History:  Diagnosis Date  . Arthritis   . CAD (coronary artery disease)   . HLD (hyperlipidemia)   . Recurrent chest pain   . Sleep apnea    cpap    Patient Active Problem List   Diagnosis Date Noted  . Arthritis of finger of both hands 10/30/2016  . Extremity atherosclerosis with intermittent claudication ([place]) 10/30/2016  . TIA (transient ischemic attack) 07/03/2016  . Arthritis 01/29/2016  . Onychomycosis 11/20/2015  . Special screening for malignant neoplasms, colon   . Benign neoplasm  of sigmoid colon   . Hyperlipidemia 11/06/2015  . Sleep apnea 10/09/2015  . HTN (hypertension) 04/27/2012  . CAD (coronary artery disease) 04/27/2012    Past Surgical History:  Procedure Laterality Date  . COLONOSCOPY WITH PROPOFOL N/A 11/17/2015   Procedure: COLONOSCOPY WITH PROPOFOL;  Surgeon: Lucilla Lame, MD;  Location: [place];  Service: Endoscopy;  Laterality: N/A;  CPAP  . NASAL SINUS SURGERY    . POLYPECTOMY N/A 11/17/2015   Procedure: POLYPECTOMY;  Surgeon: Lucilla Lame, MD;  Location: [facility];  Service: Endoscopy;  Laterality: N/A;  sigmoid polyp  . PTCA     3 stents    Prior to Admission medications   Medication Sig Start Date End Date Taking? Authorizing Provider  aspirin 81 MG tablet Take 81 mg daily by mouth.    Yes [provider]  cilostazol (PLETAL) 100 MG tablet Take 1 tablet by mouth daily. 05/28/17  Yes [provider]  clopidogrel (PLAVIX) 75 MG tablet Take 1 tablet (75 mg total) by mouth daily. 05/21/17  Yes Nahser, Wonda Cheng, MD  ezetimibe (ZETIA) 10 MG tablet Take 1 tablet (10 mg total) by mouth daily. 05/21/17  Yes Nahser, Wonda Cheng, MD  losartan (COZAAR) 50 MG tablet Take 1 tablet (50 mg total) by mouth daily. 06/02/17 05/28/18 Yes Nahser, Wonda Cheng, MD  omega-3 acid ethyl esters (LOVAZA) 1 g capsule Take 1 g by mouth daily.  Yes [provider]  rosuvastatin (CRESTOR) 5 MG tablet Take 5 mg by mouth every other day.   Yes [provider]    Allergies Crestor [rosuvastatin calcium]; Lipitor [atorvastatin calcium]; and Zocor [simvastatin]  Family History  Problem Relation Age of Onset  . Hyperlipidemia Mother   . Hypertension Mother   . Heart disease Mother   . Heart failure Mother        5  . Lung cancer Mother   . Leukemia Father   . Hypertension Sister   . Hypertension Sister   . Coronary artery disease Unknown     Social History Social History   Tobacco Use  . Smoking status: Former Smoker     Last attempt to quit: 04/22/2001    Years since quitting: 16.3  . Smokeless tobacco: Former Systems developer    Types: Snuff  Substance Use Topics  . Alcohol use: Yes    Comment: 2 beers per day  . Drug use: No    Review of Systems Constitutional: No fever/chills Eyes: No visual changes. ENT: No sore throat. Cardiovascular: Chest pain as described above Respiratory: Denies shortness of breath. Gastrointestinal: No abdominal pain.  No nausea, no vomiting.  No diarrhea.  No constipation. Genitourinary: Negative for dysuria. Musculoskeletal: Negative for neck pain.  Negative for back pain. Integumentary: Negative for rash. Neurological: Negative for headaches, focal weakness or numbness.   ____________________________________________   PHYSICAL EXAM:  VITAL SIGNS: ED Triage Vitals  Enc Vitals Group     BP 08/19/17 0223 123/80     Pulse Rate 08/19/17 0223 76     Resp 08/19/17 0223 18     Temp 08/19/17 0223 98.3 F (36.8 C)     Temp Source 08/19/17 0223 Oral     SpO2 08/19/17 0223 99 %     Weight 08/19/17 0220 102.1 kg (225 lb)     Height 08/19/17 0220 1.753 m (5\' 9" )     Head Circumference --      Peak Flow --      Pain Score 08/19/17 0220 2     Pain Loc --      Pain Edu? --      Excl. in [place]? --     Constitutional: Alert and oriented. Well appearing and in no acute distress. Eyes: Conjunctivae are normal.  Head: Atraumatic. Nose: No congestion/rhinnorhea. Mouth/Throat: Mucous membranes are moist. Neck: No stridor.  No meningeal signs.   Cardiovascular: Normal rate, regular rhythm. Good peripheral circulation. Grossly normal heart sounds. Respiratory: Normal respiratory effort.  No retractions. Lungs CTAB. Gastrointestinal: Soft and nontender. No distention.  Musculoskeletal: No lower extremity tenderness nor edema. No gross deformities of extremities. Neurologic:  Normal speech and language. No gross focal neurologic deficits are appreciated.  Skin:  Skin is warm, dry and  intact. No rash noted. Psychiatric: Mood and affect are normal. Speech and behavior are normal.  ____________________________________________   LABS (all labs ordered are listed, but only abnormal results are displayed)  Labs Reviewed  BASIC METABOLIC PANEL - Abnormal; Notable for the following components:      Result Value   Calcium 8.8 (*)    All other components within normal limits  HEPATIC FUNCTION PANEL - Abnormal; Notable for the following components:   Bilirubin, Direct <0.1 (*)    All other components within normal limits  CBC  TROPONIN I  LIPASE, BLOOD  PROTIME-INR  APTT   ____________________________________________  EKG  ED ECG REPORT #1 I, Hinda Kehr, the attending  physician, personally viewed and interpreted this ECG.  Date: 08/19/2017 EKG Time: 2:24 AM Rate: 80 Rhythm: normal sinus rhythm QRS Axis: normal Intervals: normal ST/T Wave abnormalities: normal Narrative Interpretation: no evidence of acute ischemia   ED ECG REPORT #2 I, Hinda Kehr, the attending physician, personally viewed and interpreted this ECG.  Date: 08/19/2017 EKG Time: 3:19 Rate: 68 Rhythm: normal sinus rhythm QRS Axis: normal Intervals: normal ST/T Wave abnormalities: normal Narrative Interpretation: no evidence of acute ischemia   ____________________________________________  RADIOLOGY I, Hinda Kehr, personally viewed and evaluated these images (plain radiographs) as part of my medical decision making, as well as reviewing the written report by the radiologist.  ED MD interpretation: No indication of acute cardiopulmonary disease on chest x-ray  Official radiology report(s): Dg Chest 2 View  Result Date: 08/19/2017 CLINICAL DATA:  Chest pain EXAM: CHEST - 2 VIEW COMPARISON:  None. FINDINGS: Mild bronchitic changes. No consolidation or effusion. Normal heart size. No pneumothorax. Mild degenerative changes of the spine. IMPRESSION: No active cardiopulmonary  disease.  Mild bronchitic changes. Electronically Signed   By: Donavan Foil M.D.   On: 08/19/2017 02:41    ____________________________________________   PROCEDURES  Critical Care performed: Yes, see critical care procedure note(s)   Procedure(s) performed:   .Critical Care Performed by: Hinda Kehr, MD Authorized by: Hinda Kehr, MD   Critical care provider statement:    Critical care time (minutes):  30   Critical care time was exclusive of:  Separately billable procedures and treating other patients   Critical care was necessary to treat or prevent imminent or life-threatening deterioration of the following conditions: unstable angina.   Critical care was time spent personally by me on the following activities:  Development of treatment plan with patient or surrogate, discussions with consultants, evaluation of patient's response to treatment, examination of patient, obtaining history from patient or surrogate, ordering and performing treatments and interventions, ordering and review of laboratory studies, ordering and review of radiographic studies, pulse oximetry, re-evaluation of patient's condition and review of old charts     ____________________________________________   INITIAL IMPRESSION / ASSESSMENT AND PLAN / ED COURSE  As part of my medical decision making, I reviewed the following data within the [place] notes reviewed and incorporated, Labs reviewed , EKG interpreted , Old chart reviewed, Discussed with admitting physician  and A phone consult was requested and obtained from this/these consultant(s) Cardiology (Dr. Rayann Heman).    Differential diagnosis includes, but is not limited to, ACS, aortic dissection, pulmonary embolism, cardiac tamponade, pneumothorax, pneumonia, pericarditis, myocarditis, GI-related causes including esophagitis/gastritis, and musculoskeletal chest wall pain.    In this patient's case I am concerned about  unstable angina.  He has known cardiovascular disease and 3 stents and takes Plavix.  He previously was asymptomatic with no stable angina but over the last month he has had gradually worsening and more frequent episodes of left-sided chest pain radiating down his left arm similar to his prior anginal pain.  Tonight was the most severe and most consistent and by definition this meets criteria for unstable angina.  I will start with a full dose aspirin but I anticipate he will likely need heparin and admission; he is too high risk to not treat aggressively, and I believe him and his wife in terms of both the history and there are explanation that this is not something that typically happens to him.  They are concerned, and I think understandably so, about his symptoms,  and outpatient follow-up will likely not be appropriate for him given the aggressive acceleration in his symptoms just over the last several days.  His EKG is currently reassuring but that is also to be expected with unstable angina.  Lab work is pending.  I told him we would discuss the results and then, with a plan for sure, but I am leaning towards admission.  Clinical Course as of Aug 20 350  Tue Aug 19, 2017  1224 repeat ecg for active chest pain   [CF]  0330 Normal troponin which is reassuring in that it demonstrates no active MI, but I still feel this represents unstable angina.  As documented above, patient had another brief episode of symptoms, but by the time the ECG was obtained he was again asymptomatic.    I had another discussion with the patient and his wife, and we agreed upon an aggressive approach given the high risk of ACS/UA.  I will treat with heparin bolus and infusion, and I will contact the on-call cardiologist for [facility] to ask whether or not Brilinta should be provided in addition to aspirin since the patient is already on Plavix.  will plan for admission.  Troponin I: <0.03 [CF]  0341 I called and spoke with Dr.  Rayann Heman and we discussed the case in detail.  He agreed with my plan for heparin bolus and infusion.  I asked about Brilinta versus Plavix and he suggested we continue just with the patient's regular Plavix dose.  The patient updated me that before coming to the ED tonight he took another dose of Plavix as well.  He is received a full dose aspirin and will receive the heparin as ordered.  He is currently asymptomatic.  I have paged the hospitalist and will discuss by phone and let them know about the admission plan.   [CF]  [phone] Discussed case by phone with Dr. Jodell Cipro with the hospitalist service including my conversation with Dr. Rayann Heman and the plan of care.  He agrees with the plan and will admit.   [CF]  0350 Added on PT/INR and aPTT, labs pending   [CF]  0350 BMP, hepatic function panel, lipase, and CBC all reviewed and all within normal limits   [CF]    Clinical Course User Index [CF] Hinda Kehr, MD    ____________________________________________  FINAL CLINICAL IMPRESSION(S) / ED DIAGNOSES  Final diagnoses:  Unstable angina ([place])     MEDICATIONS GIVEN DURING THIS VISIT:  Medications  heparin injection 4,000 Units (has no administration in time range)  aspirin chewable tablet 324 mg (324 mg Oral Given 08/19/17 0324)     ED Discharge Orders    None       Note:  This document was prepared using Dragon voice recognition software and may include unintentional dictation errors.    Hinda Kehr, MD 08/19/17 [phone]

## 2017-08-19 NOTE — ED Notes (Signed)
Mallie Mussel RN called the lab to make sure that there is enough blood to add on a Protime-INR; APTT to the sample in the lab. Lab states that there is enough blood to add the Protime-INR; APTT.

## 2017-08-19 NOTE — Brief Op Note (Signed)
BRIEF CARDIAC CATHETERIZATION NOTE  DATE: 08/19/2017 TIME: 11:50 AM  PATIENT:  Mario [name]  57 y.o. male  PRE-OPERATIVE DIAGNOSIS:  Unstable angina  POST-OPERATIVE DIAGNOSIS:  Unstable angina  PROCEDURE:  Procedure(s): LEFT HEART CATH AND CORONARY ANGIOGRAPHY (N/A) INTRAVASCULAR PRESSURE WIRE/FFR STUDY (N/A) CORONARY STENT INTERVENTION (N/A)  SURGEON:  Surgeon(s) and Role:    * Brayn Eckstein, Harrell Gave, MD - Primary  FINDINGS: 1. Significant LCx/OM disease with ~60% LCx/OM1 stenosis (FFR 0.78) and 70% mid LCx stenosis (jailed by LCx/OM stents; FFR 0.68). 2. No significant disease involvinig LAD or non-dominant RCA. 3. Normal LVEF. 4. Successful PCI to LCx/OM with placement of Xience Sierra 3.0 x 12 mm DES in AV-groove LCx and angioplasty of proximal LCx/OM1 stenosis using 3.5 mm [place] balloon.  RECOMMENDATIONS: 1. Aggressive secondary prevention, including tobacco cessation and increased lipid control. 2. Indefinite dual antiplatelet therapy. 3. Outpatient cardiac rehab.  Nelva Bush, MD [facility] Pager: [phone]

## 2017-08-19 NOTE — H&P (Signed)
[facility] [place] NAME: Mario [name]    MR#:  [id number]  DATE OF BIRTH:  1960/05/13  DATE OF ADMISSION:  08/19/2017  PRIMARY CARE PHYSICIAN: [name], MD   REQUESTING/REFERRING PHYSICIAN: Hinda Kehr, MD  CHIEF COMPLAINT:   Chief Complaint  Patient presents with  . Chest Pain    HISTORY OF PRESENT ILLNESS:  Mario [name]  is a 57 y.o. male with a known history of HTN, HLD, CAD/MI (s/p stent x3, 2004), TIA, GERD, OSA, DJD/OA who p/w 54mo Hx chest "pain", severe x1d. Pt states that for the past 72mo, he has been experiencing intermittent pain/burning sensation in his L chest and upper chest, w/ this burning sensation radiating to the L arm and B/L neck. He states his LUE sometimes goes numb as well. He has a difficult time characterizing the pain, stating it is neither sharp nor dull, but, "just feels different." He states the pain never occurs during the day, and rather, it only happens at night, tending to wake him from sleep. However, it does not occur every single night.  On Monday (08/18/2017) evening, pt states that he felt generally unwell, citing low-grade fever, diaphoresis, fatigue/malaise, generalized weakness and feeling "run down". He denies sick contacts. He states that he went to bed early, @~1900PM. He states he was woken up out of sleep @~0000 (midnight) w/ aforementioned chest "pain" (burning), though he states the symptoms were more severe than they had been in the previous month, w/ pain radiating to the L jaw (which he says is uncharacteristic). He endorses intermittent midback soreness between the shoulder blades, though this pain has no correlation with his chest "pain". He denies chills, N/V, diarrhea/AP, SOB, palpitations, HA, vertigo, blurred vision, LH/LOC, urinary symptoms. Pt sought hospitalization based on severity of symptoms, as well as similarity of his symptoms to those he experienced when he had a prior MI  in 2004 (which the pt's wife states are almost identical). Pt is comfortable and well-appaering, and is in no acute distress at the time of my Hx/examination.  Pt's outpt Cardiologist is Dr. Acie Fredrickson, whom pt believes he saw ~63mo ago, prior to onset of his current symptoms. He is on ASA + Plavix at home, which he takes daily. He has not had stress test, "in a long time." [facility] group consulted from ED, case d/w Dr. Rayann Heman. Pt ordered for heparin gtt, admitted for ACS evaluation.  PAST MEDICAL HISTORY:   Past Medical History:  Diagnosis Date  . Arthritis   . CAD (coronary artery disease)   . HLD (hyperlipidemia)   . Recurrent chest pain   . Sleep apnea    cpap    PAST SURGICAL HISTORY:   Past Surgical History:  Procedure Laterality Date  . COLONOSCOPY WITH PROPOFOL N/A 11/17/2015   Procedure: COLONOSCOPY WITH PROPOFOL;  Surgeon: Lucilla Lame, MD;  Location: [place];  Service: Endoscopy;  Laterality: N/A;  CPAP  . NASAL SINUS SURGERY    . POLYPECTOMY N/A 11/17/2015   Procedure: POLYPECTOMY;  Surgeon: Lucilla Lame, MD;  Location: [place];  Service: Endoscopy;  Laterality: N/A;  sigmoid polyp  . PTCA     3 stents    SOCIAL HISTORY:   Social History   Tobacco Use  . Smoking status: Former Smoker    Last attempt to quit: 04/22/2001    Years since quitting: 16.3  . Smokeless tobacco: Former Systems developer    Types: Snuff  Substance Use Topics  . Alcohol use: Yes    Comment: 2 beers per day    FAMILY HISTORY:   Family History  Problem Relation Age of Onset  . Hyperlipidemia Mother   . Hypertension Mother   . Heart disease Mother   . Heart failure Mother        28  . Lung cancer Mother   . Leukemia Father   . Hypertension Sister   . Hypertension Sister   . Coronary artery disease Unknown     DRUG ALLERGIES:   Allergies  Allergen Reactions  . Crestor [Rosuvastatin Calcium]     Joint pain.  He is able to take a low dose every other day.   .  Lipitor [Atorvastatin Calcium]     Joint pain  . Zocor [Simvastatin]     Joint pain    REVIEW OF SYSTEMS:   Review of Systems  Constitutional: Positive for diaphoresis, fever and malaise/fatigue. Negative for chills and weight loss.  HENT: Negative for congestion, hearing loss, nosebleeds, sinus pain, sore throat and tinnitus.   Eyes: Negative for blurred vision, double vision and photophobia.  Respiratory: Negative for cough, hemoptysis, sputum production, shortness of breath and wheezing.   Cardiovascular: Positive for chest pain. Negative for palpitations, orthopnea, claudication, leg swelling and PND.  Gastrointestinal: Negative for abdominal pain, blood in stool, constipation, diarrhea, heartburn, melena, nausea and vomiting.  Genitourinary: Negative for dysuria, frequency, hematuria and urgency.  Musculoskeletal: Positive for back pain. Negative for joint pain, myalgias and neck pain.  Skin: Negative for itching and rash.  Neurological: Positive for weakness. Negative for dizziness, tingling, tremors, sensory change, speech change, focal weakness, seizures, loss of consciousness and headaches.   MEDICATIONS AT HOME:   Prior to Admission medications   Medication Sig Start Date End Date Taking? Authorizing Provider  aspirin 81 MG tablet Take 81 mg daily by mouth.    Yes [provider]  cilostazol (PLETAL) 100 MG tablet Take 1 tablet by mouth daily. 05/28/17  Yes [provider]  clopidogrel (PLAVIX) 75 MG tablet Take 1 tablet (75 mg total) by mouth daily. 05/21/17  Yes Nahser, Wonda Cheng, MD  ezetimibe (ZETIA) 10 MG tablet Take 1 tablet (10 mg total) by mouth daily. 05/21/17  Yes Nahser, Wonda Cheng, MD  losartan (COZAAR) 50 MG tablet Take 1 tablet (50 mg total) by mouth daily. 06/02/17 05/28/18 Yes Nahser, Wonda Cheng, MD  omega-3 acid ethyl esters (LOVAZA) 1 g capsule Take 1 g by mouth daily.   Yes [provider]  rosuvastatin (CRESTOR) 5 MG tablet Take 5 mg by mouth  every other day.   Yes [provider]      VITAL SIGNS:  Blood pressure 130/82, pulse 62, temperature 98.3 F (36.8 C), temperature source Oral, resp. rate 15, height 5\' 9"  (1.753 m), weight 102.1 kg (225 lb), SpO2 96 %.  PHYSICAL EXAMINATION:  Physical Exam  Constitutional: He is oriented to person, place, and time. He appears well-developed and well-nourished. He is active and cooperative.  Non-toxic appearance. He does not have a sickly appearance. He does not appear ill. No distress.  HENT:  Head: Normocephalic and atraumatic.  Mouth/Throat: No oropharyngeal exudate.  Eyes: Pupils are equal, round, and reactive to light. Conjunctivae, EOM and lids are normal. No scleral icterus.  Neck: Normal range of motion. Neck supple. No JVD present. No thyromegaly present.  Cardiovascular: Normal rate, regular rhythm, S1 normal, S2 normal and normal heart sounds.  No extrasystoles  are present. Exam reveals no gallop, no S3, no S4, no distant heart sounds and no friction rub.  No murmur heard.  No systolic murmur is present.  No diastolic murmur is present. Pulmonary/Chest: Effort normal and breath sounds normal. No accessory muscle usage or stridor. No apnea and no tachypnea. No respiratory distress. He has no decreased breath sounds. He has no wheezes. He has no rhonchi. He has no rales.  Abdominal: Soft. Bowel sounds are normal. He exhibits no distension. There is no tenderness. There is no rebound and no guarding.  Musculoskeletal: Normal range of motion. He exhibits no edema or tenderness.       Right lower leg: Normal. He exhibits no edema.       Left lower leg: Normal. He exhibits no edema.  Lymphadenopathy:    He has no cervical adenopathy.  Neurological: He is alert and oriented to person, place, and time. He is not disoriented.  Skin: Skin is warm, dry and intact. No rash noted. He is not diaphoretic. No erythema.  Psychiatric: He has a normal mood and affect. His speech is  normal and behavior is normal. Judgment and thought content normal. His mood appears not anxious. He is not agitated. Cognition and memory are normal.   LABORATORY PANEL:   CBC Recent Labs  Lab 08/19/17 0242  WBC 6.1  HGB 14.6  HCT 42.9  PLT 213   ------------------------------------------------------------------------------------------------------------------  Chemistries  Recent Labs  Lab 08/19/17 0242  NA 137  K 4.1  CL 104  CO2 24  GLUCOSE 96  BUN 11  CREATININE 0.99  CALCIUM 8.8*  AST 27  ALT 30  ALKPHOS 56  BILITOT 0.6   ------------------------------------------------------------------------------------------------------------------  Cardiac Enzymes Recent Labs  Lab 08/19/17 0242  TROPONINI <0.03   ------------------------------------------------------------------------------------------------------------------  RADIOLOGY:  Dg Chest 2 View  Result Date: 08/19/2017 CLINICAL DATA:  Chest pain EXAM: CHEST - 2 VIEW COMPARISON:  None. FINDINGS: Mild bronchitic changes. No consolidation or effusion. Normal heart size. No pneumothorax. Mild degenerative changes of the spine. IMPRESSION: No active cardiopulmonary disease.  Mild bronchitic changes. Electronically Signed   By: Donavan Foil M.D.   On: 08/19/2017 02:41   IMPRESSION AND PLAN:   A/P: 25M w/ Hx CAD/MI p/w CP/unstable angina.  1.) CP/unstable angina: Pt w/ known Hx CAD/MI (s/p stent x3 in 2004) p/w similar symptoms at rest intermittently x12mo (as per HPI), acutely worse in severity x1d. VSS. Physical examination largely benign. Trop-I (-) x1, 2x rpt pending. EKG unchanged from prior, (-) acute ischemic ST/TW changes. CXR (-) active disease. Started on heparin gtt in ED. Cardiology consulted, to see pt in AM. C/w ASA, Plavix, Statin, Zetia, Lovaza, ARB. Not on beta blocker at present. Morphine, O2, NTG. Tele, cardiac monitoring.  2.) HTN: c/w Losartan.  3.) HLD/TIA: c/w ASA, Plavix, Statin, Zetia,  Lovaza.  4.) FEN/GI: Cardiac diet.  5.) DVT PPx: Full-dose therapeutic anticoagulation w/ heparin gtt.  6.) Code status: Full code.  7.) Disposition: Admission, pt expected to stay > 2 midnights.   All the records are reviewed and case discussed with ED provider. Management plans discussed with the patient, family and they are in agreement.  CODE STATUS: Full code.  TOTAL TIME TAKING CARE OF THIS PATIENT: 75 minutes.    Arta Silence M.D on 08/19/2017 at 4:58 AM  Between 7am to 6pm - Pager - [phone]  After 6pm go to [email or web address] - [facility]  Office  [phone]  CC:  Primary care physician; [name], MD   Note: This dictation was prepared with Dragon dictation along with smaller phrase technology. Any transcriptional errors that result from this process are unintentional.

## 2017-08-19 NOTE — H&P (View-Only) (Signed)
Cardiology Consultation:   Patient ID: Mario [name]; [id number]; [birth year]   Admit date: 08/19/2017 Date of Consult: 08/19/2017  Primary Care Provider: [name], MD Primary Cardiologist: Nahser   Patient Profile:   Mario [name] is a 57 y.o. male with a hx of CAD s/p prior remote stenting in 2004, possible TIA, PVD, HTN, and HLD who is being seen today for the evaluation of unstable angina at the request of Dr. Jodell Cipro, MD.  History of Present Illness:   Mr. Stidham was previously admitted to the hospital in 04/2002 with worsening fatigue and nocturnal chest pain/angina decubitus. He underwent diagnostic LHC on 04/23/2002 that showed LAD fairly normal, LCx was large and dominant with ~ 60% stenosis of the proximal OM1, RCA without obstructive disease. He was initially medically managed. However, he returned to the hospital on 04/27/2002 with worsening chest pain and underwent repeat LHC with successful PCI/BMS to the LCx. In 11/2002 he returned with worsening chest pain. He underwent repeat LHC at that time with successful PCI/stenting of the OM1 and distal LCx. His most recent ischemic evaluation was by nuclear stress testing in 2011 that was without ischemia and with a normal LVEF.   For the past 1 month he has had worsening fatigue and nocturnal angina that has felt very similar to his prior unstable angina leading up to his prior PCIs in 2004. Never with any exertional pain, though he did not have exertional pain in 2004 either. No associated SOB, diaphoresis, nausea, vomiting, palpitations, dizziness, presyncope, or syncope. He has had such low energy that he has been going to bed early the past several weeks. On the night of 4/29, he was laying down and developed worsening chest pain that radiated down the left arm and up into the neck. This episode was significantly worse than his prior episodes in that it was not resolve. Over the prior month his pain would improve if he sat up ,  however this episode was constant prompting him to come to the ED for evaluation.   Upon the patient's arrival to [facility] they were found to have stable vitals with a mildly elevated BP. His chest pain persisted until he received ASA and heparin. EKG not acute as below, CXR not acute. Labs showed troponin negative x 1, unremarkable CBC, SCr 0.99, K+ 4.1, LFT normal, lipase normal. He was started on a heparin gtt overnight. Currently chest pain free.   Past Medical History:  Diagnosis Date  . Arthritis   . CAD (coronary artery disease)   . HLD (hyperlipidemia)   . Recurrent chest pain   . Sleep apnea    cpap    Past Surgical History:  Procedure Laterality Date  . COLONOSCOPY WITH PROPOFOL N/A 11/17/2015   Procedure: COLONOSCOPY WITH PROPOFOL;  Surgeon: Lucilla Lame, MD;  Location: [place];  Service: Endoscopy;  Laterality: N/A;  CPAP  . NASAL SINUS SURGERY    . POLYPECTOMY N/A 11/17/2015   Procedure: POLYPECTOMY;  Surgeon: Lucilla Lame, MD;  Location: [place];  Service: Endoscopy;  Laterality: N/A;  sigmoid polyp  . PTCA     3 stents     Home Meds: Prior to Admission medications   Medication Sig Start Date End Date Taking? Authorizing Provider  aspirin 81 MG tablet Take 81 mg daily by mouth.    Yes [provider]  cilostazol (PLETAL) 100 MG tablet Take 1 tablet by mouth daily. 05/28/17  Yes [provider]  clopidogrel (PLAVIX) 75 MG tablet Take 1 tablet (75 mg total) by mouth daily. 05/21/17  Yes Nahser, Wonda Cheng, MD  ezetimibe (ZETIA) 10 MG tablet Take 1 tablet (10 mg total) by mouth daily. 05/21/17  Yes Nahser, Wonda Cheng, MD  losartan (COZAAR) 50 MG tablet Take 1 tablet (50 mg total) by mouth daily. 06/02/17 05/28/18 Yes Nahser, Wonda Cheng, MD  omega-3 acid ethyl esters (LOVAZA) 1 g capsule Take 1 g by mouth daily.   Yes [provider]  rosuvastatin (CRESTOR) 5 MG tablet Take 5 mg by mouth every other day.   Yes [provider]     Inpatient Medications: Scheduled Meds: . [START ON 08/20/2017] aspirin EC  81 mg Oral Daily  . cilostazol  100 mg Oral Daily  . clopidogrel  75 mg Oral Daily  . ezetimibe  10 mg Oral Daily  . losartan  50 mg Oral Daily  . omega-3 acid ethyl esters  1 g Oral Daily  . rosuvastatin  5 mg Oral QODAY   Continuous Infusions: . heparin 1,200 Units/hr (08/19/17 0451)   PRN Meds: acetaminophen, bisacodyl, morphine injection **OR** morphine injection, nitroGLYCERIN, ondansetron (ZOFRAN) IV, senna-docusate  Allergies:   Allergies  Allergen Reactions  . Crestor [Rosuvastatin Calcium]     Joint pain.  He is able to take a low dose every other day.   . Lipitor [Atorvastatin Calcium]     Joint pain  . Zocor [Simvastatin]     Joint pain    Social History:   Social History   Socioeconomic History  . Marital status: Married    Spouse name: Not on file  . Number of children: 3  . Years of education: 9  . Highest education level: Not on file  Occupational History  . Not on file  Social Needs  . Financial resource strain: Not on file  . Food insecurity:    Worry: Not on file    Inability: Not on file  . Transportation needs:    Medical: Not on file    Non-medical: Not on file  Tobacco Use  . Smoking status: Former Smoker    Last attempt to quit: 04/22/2001    Years since quitting: 16.3  . Smokeless tobacco: Former Systems developer    Types: Snuff  Substance and Sexual Activity  . Alcohol use: Yes    Comment: 2 beers per day  . Drug use: No  . Sexual activity: Not on file  Lifestyle  . Physical activity:    Days per week: Not on file    Minutes per session: Not on file  . Stress: Not on file  Relationships  . Social connections:    Talks on phone: Not on file    Gets together: Not on file    Attends religious service: Not on file    Active member of club or organization: Not on file    Attends meetings of clubs or organizations: Not on file    Relationship status: Not on file   . Intimate partner violence:    Fear of current or ex partner: Not on file    Emotionally abused: Not on file    Physically abused: Not on file    Forced sexual activity: Not on file  Other Topics Concern  . Not on file  Social History Narrative   Lives at home with his wife.   Right-handed.   No daily caffeine use.     Family History:  Family History  Problem Relation  Age of Onset  . Hyperlipidemia Mother   . Hypertension Mother   . Heart disease Mother   . Heart failure Mother        30  . Lung cancer Mother   . Leukemia Father   . Hypertension Sister   . Hypertension Sister   . Coronary artery disease Unknown     ROS:   Review of Systems  Constitutional: Positive for malaise/fatigue. Negative for chills, diaphoresis, fever and weight loss.  HENT: Negative for congestion.   Eyes: Negative for discharge and redness.  Respiratory: Negative for cough, hemoptysis, sputum production, shortness of breath and wheezing.   Cardiovascular: Positive for chest pain. Negative for palpitations, orthopnea, claudication, leg swelling and PND.  Gastrointestinal: Negative for abdominal pain, blood in stool, heartburn, melena, nausea and vomiting.  Genitourinary: Negative for hematuria.  Musculoskeletal: Positive for myalgias. Negative for falls.  Skin: Negative for rash.  Neurological: Positive for weakness. Negative for dizziness, tingling, tremors, sensory change, speech change, focal weakness and loss of consciousness.  Endo/Heme/Allergies: Does not bruise/bleed easily.  Psychiatric/Behavioral: Negative for substance abuse. The patient is not nervous/anxious.   All other systems reviewed and are negative.    Physical Exam/Data:   Vitals:   08/19/17 0500 08/19/17 0530 08/19/17 0622 08/19/17 0728  BP: 131/84 (!) 109/56 127/85 109/71  Pulse: 63 61 66 62  Resp: 20 10    Temp:   97.9 F (36.6 C) 98 F (36.7 C)  TempSrc:   Oral Oral  SpO2: 97% 98% 98% 98%  Weight:   220 lb  (99.8 kg)   Height:   5\' 9"  (1.753 m)    No intake or output data in the 24 hours ending 08/19/17 0802 Filed Weights   08/19/17 0220 08/19/17 0622  Weight: 225 lb (102.1 kg) 220 lb (99.8 kg)   Body mass index is 32.49 kg/m.   Physical Exam: General: Well developed, well nourished, in no acute distress. Head: Normocephalic, atraumatic, sclera non-icteric, no xanthomas, nares without discharge.  Neck: Negative for carotid bruits. JVD not elevated. Lungs: Clear bilaterally to auscultation without wheezes, rales, or rhonchi. Breathing is unlabored. Heart: RRR with S1 S2. No murmurs, rubs, or gallops appreciated. Abdomen: Soft, non-tender, non-distended with normoactive bowel sounds. No hepatomegaly. No rebound/guarding. No obvious abdominal masses. Msk:  Strength and tone appear normal for age. Extremities: No clubbing or cyanosis. No edema. Distal pedal pulses are 2+ and equal bilaterally. Neuro: Alert and oriented X 3. No facial asymmetry. No focal deficit. Moves all extremities spontaneously. Psych:  Responds to questions appropriately with a normal affect.   EKG:  The EKG was personally reviewed and demonstrates: NSR, 68 bpm, no acute st/t changes Telemetry:  Telemetry was personally reviewed and demonstrates: NSR  Weights: [employer]   08/19/17 0220 08/19/17 0622  Weight: 225 lb (102.1 kg) 220 lb (99.8 kg)    Relevant CV Studies: LHC 2004: ANGIOGRAPHY:  The left main is very short and is otherwise normal.   The left anterior descending artery is a moderate sized vessel.  There are  minor luminal irregularities in the LAD.  There are no discreet stenoses.  There are several diagonal branches which are normal.   The left circumflex artery is a large and dominant vessel.  There is a  stenosis at the bifurcation of the first obtuse marginal artery and the  distal circumflex artery.  This stenosis is approximately 75% in each limb.  The previously placed stent in the  first obtuse  marginal artery has moderate  diffuse in-stent restenosis.  There is TIMI grade 3 flow down both vessels.   The right coronary artery is small and nondominant.  There was catheter  dampening with engagement.   LEFT VENTRICULOGRAM:  The left ventriculogram was performed in a 30 RAO  position.  It reveals overall normal left ventricular systolic function.  The ejection fraction is approximately 60%.  There is some catheter induced  mitral regurgitation.   PTCA:  The left main was engaged using a 6-French Judkins left 4 guide.  The  patient was given heparin at a weight based amount followed by a double  bolus Integrilin drip.  The circumflex marginal vessel was wired using a  Patriot guidewire.  A 3.5 x 15 mm Quantum Maverick was positioned down  across the stenosis and was inflated up to 8 atmospheres for 44 seconds.  This resulted in a very nice angiographic view with significant improvement  of the lesion.  At this point a 3.5 x 16 mm Taxus stent was positioned  across the stenosis.  We positioned the stent to extend up to cover the  entrance of the distal circumflex.  It was deployed at 12 atmospheres for 34  seconds.  This resulted in a very nice angiographic result of the obtuse  marginal artery.  There was some compromise of the true circumflex.  The  stent balloon was pulled out and the 3.5 x 15 mm Quantum Maverick was placed  down into the distal aspect of the previously placed stent.  It was deflated  up to 18 atmospheres for 41 seconds and then pulled back and was inflated up  to 18 atmospheres for another 20 seconds.  This resulted in further  improvement of the origin stent and the placed stent.  The wire was then  pulled out of the obtuse marginal artery and was repositioned down the true  circumflex.  The 3.5 x 15 mm Quantum would not pass because of the wing of  the balloon.  At this point a 3.0 x 15 mm Maverick balloon was able to slide  down the true  circumflex.  It was inflated up to 8 atmospheres for 32  seconds which resulted in significant improvement of the vessel opening.  There was perhaps a 10-20% residual stenosis.   COMPLICATIONS:  None.   CONCLUSION:  1. Successful PTCA and stenting of the first obtuse marginal artery and the     distal circumflex artery.  2. Normal left ventricular systolic function.  Myoview 2011: No ischemia, EF normal  Laboratory Data:  Chemistry Recent Labs  Lab 08/19/17 0242  NA 137  K 4.1  CL 104  CO2 24  GLUCOSE 96  BUN 11  CREATININE 0.99  CALCIUM 8.8*  GFRNONAA >60  GFRAA >60  ANIONGAP 9    Recent Labs  Lab 08/19/17 0242  PROT 6.6  ALBUMIN 4.2  AST 27  ALT 30  ALKPHOS 56  BILITOT 0.6   Hematology Recent Labs  Lab 08/19/17 0242  WBC 6.1  RBC 4.67  HGB 14.6  HCT 42.9  MCV 91.9  MCH 31.3  MCHC 34.1  RDW 13.4  PLT 213   Cardiac Enzymes Recent Labs  Lab 08/19/17 0242  TROPONINI <0.03   No results for input(s): TROPIPOC in the last 168 hours.  BNPNo results for input(s): BNP, PROBNP in the last 168 hours.  DDimer No results for input(s): DDIMER in the last 168 hours.  Radiology/Studies:  Dg Chest  2 View  Result Date: 08/19/2017 IMPRESSION: No active cardiopulmonary disease.  Mild bronchitic changes. Electronically Signed   By: Donavan Foil M.D.   On: 08/19/2017 02:41    Assessment and Plan:   1. CAD of the native coronary arteries with unstable angina: -Currently chest pain free -Continue heparin gtt -ASA and Plavix -Initial troponin negative, continue to cycle to rule out -Schedule LHC with Dr. Saunders Revel this morning given his symptoms are quite similar to his prior PCI in 2004 -Check echo -Check LDL for further risk stratification  -Not on beta blocker given heart rates in the low 60s bpm, consider adding Coreg pending LHC results -Risks and benefits of cardiac catheterization have been discussed with the patient including risks of bleeding, bruising,  infection, kidney damage, stroke, heart attack, and death. The patient understands these risks and is willing to proceed with the procedure. All questions have been answered and concerns listened to  2. PVD: -Pletal -Outpatient follow up  3. HLD: -Check lipid panel -Continue Crestor, Zetia, Lovaza -If LDL not at goal of < 70, refer to lipid clinic as an outpatient   4. HTN: -BP improved -Continue current medications  5. History of possible TIA: -As above    For questions or updates, please contact [place] Please consult [email or web address] for contact info under Cardiology/STEMI.   Signed, Christell Faith, PA-C [place] Pager: [phone] 08/19/2017, 8:02 AM

## 2017-08-19 NOTE — Progress Notes (Signed)
ANTICOAGULATION CONSULT NOTE - Initial Consult  Pharmacy Consult for heparin drip Indication: chest pain/ACS/STEM  Allergies  Allergen Reactions  . Crestor [Rosuvastatin Calcium]     Joint pain.  He is able to take a low dose every other day.   . Lipitor [Atorvastatin Calcium]     Joint pain  . Zocor [Simvastatin]     Joint pain    Patient Measurements: Height: 5\' 9"  (175.3 cm) Weight: 225 lb (102.1 kg) IBW/kg (Calculated) : 70.7 Heparin Dosing Weight: 92 kg  Vital Signs: Temp: 98.3 F (36.8 C) (04/30 0223) Temp Source: Oral (04/30 0223) BP: 152/78 (04/30 0330) Pulse Rate: 65 (04/30 0330)  Labs: Recent Labs    08/19/17 0242  HGB 14.6  HCT 42.9  PLT 213  APTT 29  LABPROT 12.2  INR 0.91  CREATININE 0.99  TROPONINI <0.03    Estimated Creatinine Clearance: 100.5 mL/min (by C-G formula based on SCr of 0.99 mg/dL).   Medical History: Past Medical History:  Diagnosis Date  . Arthritis   . CAD (coronary artery disease)   . HLD (hyperlipidemia)   . Recurrent chest pain   . Sleep apnea    cpap    Medications:  No anticoagulation in PTA meds  Assessment: First trop <0.03  Goal of Therapy:  Heparin level 0.3-0.7 units/ml Monitor platelets by anticoagulation protocol: Yes   Plan:  4000 unit bolus and initial rate of 1200 units/hr. First heparin level 6 hours after start of infusion.  Adeja Sarratt S 08/19/2017,4:10 AM

## 2017-08-19 NOTE — Progress Notes (Signed)
Patient admitted this morning with chest pain.  Patient is to undergo cardiac catheterization.  Further recommendations after cardiac catheterization.

## 2017-08-19 NOTE — Interval H&P Note (Signed)
History and Physical Interval Note:  08/19/2017 8:56 AM  [name]  has presented today for cardiac catheterization, with the diagnosis of unstable angina  The various methods of treatment have been discussed with the patient and family. After consideration of risks, benefits and other options for treatment, the patient has consented to  Procedure(s): LEFT HEART CATH AND CORONARY ANGIOGRAPHY (N/A) as a surgical intervention .  The patient's history has been reviewed, patient examined, no change in status, stable for surgery.  I have reviewed the patient's chart and labs.  Questions were answered to the patient's satisfaction.    Cath Lab Visit (complete for each Cath Lab visit)  Clinical Evaluation Leading to the Procedure:   ACS: Yes.    Non-ACS:  N/A  Rashanda Magloire

## 2017-08-19 NOTE — OR Nursing (Signed)
Patient's wife reports some episodes of "chest discomfort" with patient, on questioning patient he denies c/o chest pain, 12 lead EKG done with no changes noted , Dr End paged and aware of above .Marland KitchenMalox ordered PRN

## 2017-08-19 NOTE — Plan of Care (Signed)
  Problem: Education: Goal: Knowledge of General Education information will improve Outcome: Progressing   Problem: Health Behavior/Discharge Planning: Goal: Ability to manage health-related needs will improve Outcome: Progressing   Problem: Clinical Measurements: Goal: Ability to maintain clinical measurements within normal limits will improve Outcome: Progressing Goal: Diagnostic test results will improve Outcome: Progressing Goal: Cardiovascular complication will be avoided Outcome: Progressing   Problem: Activity: Goal: Risk for activity intolerance will decrease Outcome: Progressing   Problem: Education: Goal: Understanding of cardiac disease, CV risk reduction, and recovery process will improve Outcome: Progressing   Problem: Cardiac: Goal: Ability to achieve and maintain adequate cardiovascular perfusion will improve Outcome: Progressing

## 2017-08-19 NOTE — Consult Note (Signed)
Cardiology Consultation:   Patient ID: KILAN BANFILL; [id number]; 1960/11/27   Admit date: 08/19/2017 Date of Consult: 08/19/2017  Primary Care Provider: [name], MD Primary Cardiologist: Nahser   Patient Profile:   Mario [name] is a 57 y.o. male with a hx of CAD s/p prior remote stenting in 2004, possible TIA, PVD, HTN, and HLD who is being seen today for the evaluation of unstable angina at the request of Dr. Jodell Cipro, MD.  History of Present Illness:   Mario [name] was previously admitted to the hospital in 04/2002 with worsening fatigue and nocturnal chest pain/angina decubitus. He underwent diagnostic LHC on 04/23/2002 that showed LAD fairly normal, LCx was large and dominant with ~ 60% stenosis of the proximal OM1, RCA without obstructive disease. He was initially medically managed. However, he returned to the hospital on 04/27/2002 with worsening chest pain and underwent repeat LHC with successful PCI/BMS to the LCx. In 11/2002 he returned with worsening chest pain. He underwent repeat LHC at that time with successful PCI/stenting of the OM1 and distal LCx. His most recent ischemic evaluation was by nuclear stress testing in 2011 that was without ischemia and with a normal LVEF.   For the past 1 month he has had worsening fatigue and nocturnal angina that has felt very similar to his prior unstable angina leading up to his prior PCIs in 2004. Never with any exertional pain, though he did not have exertional pain in 2004 either. No associated SOB, diaphoresis, nausea, vomiting, palpitations, dizziness, presyncope, or syncope. He has had such low energy that he has been going to bed early the past several weeks. On the night of 4/29, he was laying down and developed worsening chest pain that radiated down the left arm and up into the neck. This episode was significantly worse than his prior episodes in that it was not resolve. Over the prior month his pain would improve if he sat up ,  however this episode was constant prompting him to come to the ED for evaluation.   Upon the patient's arrival to [facility] they were found to have stable vitals with a mildly elevated BP. His chest pain persisted until he received ASA and heparin. EKG not acute as below, CXR not acute. Labs showed troponin negative x 1, unremarkable CBC, SCr 0.99, K+ 4.1, LFT normal, lipase normal. He was started on a heparin gtt overnight. Currently chest pain free.   Past Medical History:  Diagnosis Date  . Arthritis   . CAD (coronary artery disease)   . HLD (hyperlipidemia)   . Recurrent chest pain   . Sleep apnea    cpap    Past Surgical History:  Procedure Laterality Date  . COLONOSCOPY WITH PROPOFOL N/A 11/17/2015   Procedure: COLONOSCOPY WITH PROPOFOL;  Surgeon: Lucilla Lame, MD;  Location: [place];  Service: Endoscopy;  Laterality: N/A;  CPAP  . NASAL SINUS SURGERY    . POLYPECTOMY N/A 11/17/2015   Procedure: POLYPECTOMY;  Surgeon: Lucilla Lame, MD;  Location: [place];  Service: Endoscopy;  Laterality: N/A;  sigmoid polyp  . PTCA     3 stents     Home Meds: Prior to Admission medications   Medication Sig Start Date End Date Taking? Authorizing Provider  aspirin 81 MG tablet Take 81 mg daily by mouth.    Yes [provider]  cilostazol (PLETAL) 100 MG tablet Take 1 tablet by mouth daily. 05/28/17  Yes [provider]  clopidogrel (PLAVIX) 75 MG tablet Take 1 tablet (75 mg total) by mouth daily. 05/21/17  Yes Nahser, Wonda Cheng, MD  ezetimibe (ZETIA) 10 MG tablet Take 1 tablet (10 mg total) by mouth daily. 05/21/17  Yes Nahser, Wonda Cheng, MD  losartan (COZAAR) 50 MG tablet Take 1 tablet (50 mg total) by mouth daily. 06/02/17 05/28/18 Yes Nahser, Wonda Cheng, MD  omega-3 acid ethyl esters (LOVAZA) 1 g capsule Take 1 g by mouth daily.   Yes [provider]  rosuvastatin (CRESTOR) 5 MG tablet Take 5 mg by mouth every other day.   Yes [provider]     Inpatient Medications: Scheduled Meds: . [START ON 08/20/2017] aspirin EC  81 mg Oral Daily  . cilostazol  100 mg Oral Daily  . clopidogrel  75 mg Oral Daily  . ezetimibe  10 mg Oral Daily  . losartan  50 mg Oral Daily  . omega-3 acid ethyl esters  1 g Oral Daily  . rosuvastatin  5 mg Oral QODAY   Continuous Infusions: . heparin 1,200 Units/hr (08/19/17 0451)   PRN Meds: acetaminophen, bisacodyl, morphine injection **OR** morphine injection, nitroGLYCERIN, ondansetron (ZOFRAN) IV, senna-docusate  Allergies:   Allergies  Allergen Reactions  . Crestor [Rosuvastatin Calcium]     Joint pain.  He is able to take a low dose every other day.   . Lipitor [Atorvastatin Calcium]     Joint pain  . Zocor [Simvastatin]     Joint pain    Social History:   Social History   Socioeconomic History  . Marital status: Married    Spouse name: Not on file  . Number of children: 3  . Years of education: 59  . Highest education level: Not on file  Occupational History  . Not on file  Social Needs  . Financial resource strain: Not on file  . Food insecurity:    Worry: Not on file    Inability: Not on file  . Transportation needs:    Medical: Not on file    Non-medical: Not on file  Tobacco Use  . Smoking status: Former Smoker    Last attempt to quit: 04/22/2001    Years since quitting: 16.3  . Smokeless tobacco: Former Systems developer    Types: Snuff  Substance and Sexual Activity  . Alcohol use: Yes    Comment: 2 beers per day  . Drug use: No  . Sexual activity: Not on file  Lifestyle  . Physical activity:    Days per week: Not on file    Minutes per session: Not on file  . Stress: Not on file  Relationships  . Social connections:    Talks on phone: Not on file    Gets together: Not on file    Attends religious service: Not on file    Active member of club or organization: Not on file    Attends meetings of clubs or organizations: Not on file    Relationship status: Not on file   . Intimate partner violence:    Fear of current or ex partner: Not on file    Emotionally abused: Not on file    Physically abused: Not on file    Forced sexual activity: Not on file  Other Topics Concern  . Not on file  Social History Narrative   Lives at home with his wife.   Right-handed.   No daily caffeine use.     Family History:  Family History  Problem Relation  Age of Onset  . Hyperlipidemia Mother   . Hypertension Mother   . Heart disease Mother   . Heart failure Mother        68  . Lung cancer Mother   . Leukemia Father   . Hypertension Sister   . Hypertension Sister   . Coronary artery disease Unknown     ROS:   Review of Systems  Constitutional: Positive for malaise/fatigue. Negative for chills, diaphoresis, fever and weight loss.  HENT: Negative for congestion.   Eyes: Negative for discharge and redness.  Respiratory: Negative for cough, hemoptysis, sputum production, shortness of breath and wheezing.   Cardiovascular: Positive for chest pain. Negative for palpitations, orthopnea, claudication, leg swelling and PND.  Gastrointestinal: Negative for abdominal pain, blood in stool, heartburn, melena, nausea and vomiting.  Genitourinary: Negative for hematuria.  Musculoskeletal: Positive for myalgias. Negative for falls.  Skin: Negative for rash.  Neurological: Positive for weakness. Negative for dizziness, tingling, tremors, sensory change, speech change, focal weakness and loss of consciousness.  Endo/Heme/Allergies: Does not bruise/bleed easily.  Psychiatric/Behavioral: Negative for substance abuse. The patient is not nervous/anxious.   All other systems reviewed and are negative.    Physical Exam/Data:   Vitals:   08/19/17 0500 08/19/17 0530 08/19/17 0622 08/19/17 0728  BP: 131/84 (!) 109/56 127/85 109/71  Pulse: 63 61 66 62  Resp: 20 10    Temp:   97.9 F (36.6 C) 98 F (36.7 C)  TempSrc:   Oral Oral  SpO2: 97% 98% 98% 98%  Weight:   220 lb  (99.8 kg)   Height:   5\' 9"  (1.753 m)    No intake or output data in the 24 hours ending 08/19/17 0802 Filed Weights   08/19/17 0220 08/19/17 0622  Weight: 225 lb (102.1 kg) 220 lb (99.8 kg)   Body mass index is 32.49 kg/m.   Physical Exam: General: Well developed, well nourished, in no acute distress. Head: Normocephalic, atraumatic, sclera non-icteric, no xanthomas, nares without discharge.  Neck: Negative for carotid bruits. JVD not elevated. Lungs: Clear bilaterally to auscultation without wheezes, rales, or rhonchi. Breathing is unlabored. Heart: RRR with S1 S2. No murmurs, rubs, or gallops appreciated. Abdomen: Soft, non-tender, non-distended with normoactive bowel sounds. No hepatomegaly. No rebound/guarding. No obvious abdominal masses. Msk:  Strength and tone appear normal for age. Extremities: No clubbing or cyanosis. No edema. Distal pedal pulses are 2+ and equal bilaterally. Neuro: Alert and oriented X 3. No facial asymmetry. No focal deficit. Moves all extremities spontaneously. Psych:  Responds to questions appropriately with a normal affect.   EKG:  The EKG was personally reviewed and demonstrates: NSR, 68 bpm, no acute st/t changes Telemetry:  Telemetry was personally reviewed and demonstrates: NSR  Weights: [employer]   08/19/17 0220 08/19/17 0622  Weight: 225 lb (102.1 kg) 220 lb (99.8 kg)    Relevant CV Studies: LHC 2004: ANGIOGRAPHY:  The left main is very short and is otherwise normal.   The left anterior descending artery is a moderate sized vessel.  There are  minor luminal irregularities in the LAD.  There are no discreet stenoses.  There are several diagonal branches which are normal.   The left circumflex artery is a large and dominant vessel.  There is a  stenosis at the bifurcation of the first obtuse marginal artery and the  distal circumflex artery.  This stenosis is approximately 75% in each limb.  The previously placed stent in the  first obtuse  marginal artery has moderate  diffuse in-stent restenosis.  There is TIMI grade 3 flow down both vessels.   The right coronary artery is small and nondominant.  There was catheter  dampening with engagement.   LEFT VENTRICULOGRAM:  The left ventriculogram was performed in a 30 RAO  position.  It reveals overall normal left ventricular systolic function.  The ejection fraction is approximately 60%.  There is some catheter induced  mitral regurgitation.   PTCA:  The left main was engaged using a 6-French Judkins left 4 guide.  The  patient was given heparin at a weight based amount followed by a double  bolus Integrilin drip.  The circumflex marginal vessel was wired using a  [place] guidewire.  A 3.5 x 15 mm Quantum Maverick was positioned down  across the stenosis and was inflated up to 8 atmospheres for 44 seconds.  This resulted in a very nice angiographic view with significant improvement  of the lesion.  At this point a 3.5 x 16 mm Taxus stent was positioned  across the stenosis.  We positioned the stent to extend up to cover the  entrance of the distal circumflex.  It was deployed at 12 atmospheres for 34  seconds.  This resulted in a very nice angiographic result of the obtuse  marginal artery.  There was some compromise of the true circumflex.  The  stent balloon was pulled out and the 3.5 x 15 mm Quantum Maverick was placed  down into the distal aspect of the previously placed stent.  It was deflated  up to 18 atmospheres for 41 seconds and then pulled back and was inflated up  to 18 atmospheres for another 20 seconds.  This resulted in further  improvement of the origin stent and the placed stent.  The wire was then  pulled out of the obtuse marginal artery and was repositioned down the true  circumflex.  The 3.5 x 15 mm Quantum would not pass because of the wing of  the balloon.  At this point a 3.0 x 15 mm Maverick balloon was able to slide  down the true  circumflex.  It was inflated up to 8 atmospheres for 32  seconds which resulted in significant improvement of the vessel opening.  There was perhaps a 10-20% residual stenosis.   COMPLICATIONS:  None.   CONCLUSION:  1. Successful PTCA and stenting of the first obtuse marginal artery and the     distal circumflex artery.  2. Normal left ventricular systolic function.  Myoview 2011: No ischemia, EF normal  Laboratory Data:  Chemistry Recent Labs  Lab 08/19/17 0242  NA 137  K 4.1  CL 104  CO2 24  GLUCOSE 96  BUN 11  CREATININE 0.99  CALCIUM 8.8*  GFRNONAA >60  GFRAA >60  ANIONGAP 9    Recent Labs  Lab 08/19/17 0242  PROT 6.6  ALBUMIN 4.2  AST 27  ALT 30  ALKPHOS 56  BILITOT 0.6   Hematology Recent Labs  Lab 08/19/17 0242  WBC 6.1  RBC 4.67  HGB 14.6  HCT 42.9  MCV 91.9  MCH 31.3  MCHC 34.1  RDW 13.4  PLT 213   Cardiac Enzymes Recent Labs  Lab 08/19/17 0242  TROPONINI <0.03   No results for input(s): TROPIPOC in the last 168 hours.  BNPNo results for input(s): BNP, PROBNP in the last 168 hours.  DDimer No results for input(s): DDIMER in the last 168 hours.  Radiology/Studies:  Dg Chest  2 View  Result Date: 08/19/2017 IMPRESSION: No active cardiopulmonary disease.  Mild bronchitic changes. Electronically Signed   By: Donavan Foil M.D.   On: 08/19/2017 02:41    Assessment and Plan:   1. CAD of the native coronary arteries with unstable angina: -Currently chest pain free -Continue heparin gtt -ASA and Plavix -Initial troponin negative, continue to cycle to rule out -Schedule LHC with Dr. Saunders Revel this morning given his symptoms are quite similar to his prior PCI in 2004 -Check echo -Check LDL for further risk stratification  -Not on beta blocker given heart rates in the low 60s bpm, consider adding Coreg pending LHC results -Risks and benefits of cardiac catheterization have been discussed with the patient including risks of bleeding, bruising,  infection, kidney damage, stroke, heart attack, and death. The patient understands these risks and is willing to proceed with the procedure. All questions have been answered and concerns listened to  2. PVD: -Pletal -Outpatient follow up  3. HLD: -Check lipid panel -Continue Crestor, Zetia, Lovaza -If LDL not at goal of < 70, refer to lipid clinic as an outpatient   4. HTN: -BP improved -Continue current medications  5. History of possible TIA: -As above    For questions or updates, please contact [place] Please consult [email or web address] for contact info under Cardiology/STEMI.   Signed, Christell Faith, PA-C [place] Pager: [phone] 08/19/2017, 8:02 AM

## 2017-08-19 NOTE — ED Triage Notes (Signed)
Patient ambulatory to triage with steady gait, without difficulty or distress noted; pt reports mid CP radiating into left arm tonight with no accomp symptoms; st hx of same with card stent placement

## 2017-08-20 ENCOUNTER — Telehealth: Payer: Self-pay | Admitting: Cardiovascular Disease

## 2017-08-20 DIAGNOSIS — Z720 Tobacco use: Secondary | ICD-10-CM

## 2017-08-20 LAB — BASIC METABOLIC PANEL
ANION GAP: 6 (ref 5–15)
BUN: 9 mg/dL (ref 6–20)
CALCIUM: 8.6 mg/dL — AB (ref 8.9–10.3)
CHLORIDE: 108 mmol/L (ref 101–111)
CO2: 25 mmol/L (ref 22–32)
CREATININE: 0.94 mg/dL (ref 0.61–1.24)
GFR calc non Af Amer: >60 mL/min (ref >60)
Glucose, Bld: 97 mg/dL (ref 65–99)
Potassium: 4 mmol/L (ref 3.5–5.1)
SODIUM: 139 mmol/L (ref 135–145)

## 2017-08-20 LAB — CBC
HCT: 41.4 % (ref 40.0–52.0)
HEMOGLOBIN: 14.3 g/dL (ref 13.0–18.0)
MCH: 32.1 pg (ref 26.0–34.0)
MCHC: 34.4 g/dL (ref 32.0–36.0)
MCV: 93.3 fL (ref 80.0–100.0)
PLATELETS: 201 10*3/uL (ref 150–440)
RBC: 4.44 MIL/uL (ref 4.40–5.90)
RDW: 13.5 % (ref 11.5–14.5)
WBC: 4.9 10*3/uL (ref 3.8–10.6)

## 2017-08-20 MED ORDER — NITROGLYCERIN 0.4 MG SL SUBL: 0.4000 mg | SUBLINGUAL_TABLET | SUBLINGUAL | 12 refills | Status: DC | PRN

## 2017-08-20 MED ORDER — ROSUVASTATIN CALCIUM 5 MG PO TABS
5.0000 mg | ORAL_TABLET | Freq: Every day | ORAL | Status: DC
  Administered 2017-08-20: 5 mg via ORAL
  Filled 2017-08-20: qty 1

## 2017-08-20 MED ORDER — ROSUVASTATIN CALCIUM 5 MG PO TABS: 5.0000 mg | ORAL_TABLET | Freq: Every day | ORAL | 0 refills | Status: DC

## 2017-08-20 NOTE — Progress Notes (Signed)
Cardiovascular and Pulmonary Nurse Navigator Note Presentation and history:   Mario [name]  is a 57 y.o. male with a known history of HTN, HLD, CAD/MI (s/p stent x3, 2004), TIA, GERD, OSA, DJD/OA who presented to the ER with 79mo Hx chest "pain", severe x1d. Pt states that for the past 30mo, he has been experiencing intermittent pain/burning sensation in his L chest and upper chest, w/ this burning sensation radiating to the L arm and B/L neck. He states his LUE sometimes goes numb as well. He has a difficult time characterizing the pain, stating it is neither sharp nor dull, but, "just feels different." He states the pain never occurs during the day, and rather, it only happens at night, tending to wake him from sleep. However, it does not occur every single night.  On Monday (08/18/2017) evening, pt states that he felt generally unwell, citing low-grade fever, diaphoresis, fatigue/malaise, generalized weakness and feeling "run down". He denies sick contacts. He states that he went to bed early, @~1900PM. He states he was woken up out of sleep @~0000 (midnight) w/ aforementioned chest "pain" (burning), though he states the symptoms were more severe than they had been in the previous month, w/ pain radiating to the L jaw (which he says is uncharacteristic). He endorses intermittent midback soreness between the shoulder blades, though this pain has no correlation with his chest "pain". He denies chills, N/V, diarrhea/AP, SOB, palpitations, HA, vertigo, blurred vision, LH/LOC, urinary symptoms. Pt sought hospitalization based on severity of symptoms, as well as similarity of his symptoms to those he experienced when he had a prior MI in 2004 (which the pt's wife states are almost identical). Pt is comfortable and well-appaering, and is in no acute distress at the time of my Hx/examination.  Pt's outpt Cardiologist is Dr. Acie Fredrickson, whom pt believes he saw ~11mo ago, prior to onset of his current symptoms. He is on  ASA + Plavix at home, which he takes daily. He has not had stress test, "in a long time." [facility]iology group consulted from ED, case d/w Dr. Rayann Heman. Pt ordered for heparin gtt, admitted for ACS evaluation.  Active Problem List:   1.) CP/unstable angina:  Trop-I (-)  2x . EKG unchanged from prior, (-) acute ischemic.,  CXR (-) active disease.  Cardiology performed Cardiac Cath:  PROCEDURE:  Procedure(s): LEFT HEART CATH AND CORONARY ANGIOGRAPHY  INTRAVASCULAR PRESSURE WIRE/FFR STUDY  CORONARY STENT INTERVENTION   SURGEON:  Surgeon(s) and Role:    * End, Harrell Gave, MD - Primary  FINDINGS: 1. Significant LCx/OM disease with ~60% LCx/OM1 stenosis (FFR 0.78) and 70% mid LCx stenosis (jailed by LCx/OM stents; FFR 0.68). 2. No significant disease involvinig LAD or non-dominant RCA. 3. Normal LVEF. 4. Successful PCI to LCx/OM with placement of Xience [place] 3.0 x 12 mm DES in AV-groove LCx and angioplasty of proximal LCx/OM1 stenosis using 3.5 mm [place] balloon.  RECOMMENDATIONS POST CATH:   1. Aggressive secondary prevention, including tobacco cessation and increased lipid control. 2. Indefinite dual antiplatelet therapy. 3. Outpatient cardiac rehab.  2.) HTN: c/w Losartan.  3.) HLD/TIA: c/w ASA, Plavix, Statin, Zetia, Lovaza.  Education:  Discussed the definition of CAD. Reviewed the location of?CAD and where stents were placed.? ? Discussed modifiable risk factors including controlling blood pressure, cholesterol, and blood sugar; following heart healthy diet; maintaining healthy weight; exercise; and smoking cessation, if applicable. ?Note: Patient is a former  smoker.??? ? Discussed cardiac medications including rationale for taking, mechanisms of action, and  side effects. Stressed the importance of taking medications as prescribed.  ? Discussed emergency plan for heart attack symptoms. Patient verbalized understanding of need to call 911 and not to drive himself?or  have his family drive?him?to the?ER if having cardiac symptoms / chest pain.  ? Heart healthy diet of low sodium, low fat, low cholesterol heart healthy diet discussed. Wife stated that have just gotten away from exercise and eating an heart healthy diet.   ? Smoking Cessation - Patient is a former smoker.    Exercise - Benefits of exercised discussed. Informed patient/ wife cardiologist has referred him to outpatient Cardiac Rehab. An overview of the program was provided. Brochure, informational letter, class and orientation times, and CPT billing codes given to patient.  Patient informed this RN that he had his first MI at age of 63 and he did not participate in Cardiac Rehab at that time. Patient reported that he exercised and followed a heart healthy diet and as time passed he just got out of the habit of a heart healthy lifestyle.  Patient will check with his insurance to see what if any out-of-pocket expenses he might have in consideration of participating in Cardiac Rehab.  Patient is leaning toward walking during his lunch break and getting a pedometer/Fitbit to count his steps.   ? Patient and wife appreciative of the information.  ? Roanna Epley, RN, BSN, [facility]? [place] [facility]  Cardiovascular &?Pulmonary Nurse Navigator  Direct Line: [phone]  Department Phone #: [phone] Fax: [phone]? Email Address: [email or web address] ?

## 2017-08-20 NOTE — Discharge Summary (Signed)
[name]    MR#:  [id number]  DATE OF BIRTH:  07/25/1960  DATE OF ADMISSION:  08/19/2017 ADMITTING PHYSICIAN: Amelia Jo, MD  DATE OF DISCHARGE: 08/20/2017   PRIMARY CARE PHYSICIAN: [name], MD    ADMISSION DIAGNOSIS:  Unstable angina ([place]) [I20.0]  DISCHARGE DIAGNOSIS:  Active Problems:   Chest pain   Unstable angina ([place])   SECONDARY DIAGNOSIS:   Past Medical History:  Diagnosis Date  . Arthritis   . CAD (coronary artery disease)   . HLD (hyperlipidemia)   . Recurrent chest pain   . Sleep apnea    cpap    HOSPITAL COURSE:   1.) CP/unstable angina: Pt w/ known Hx CAD/MI (s/p stent x3 in 2004) p/w similar symptoms at rest intermittently x49mo (as per HPI), acutely worse in severity x1d. VSS. Physical examination largely benign. Trop-I (-)  2x . EKG unchanged from prior, (-) acute ischemic ST/TW changes. CXR (-) active disease. Started on heparin gtt in ED. Cardiology consulted, to see pt . C/w ASA, Plavix, Statin, Zetia, Lovaza, ARB. Not on beta blocker at present due to Low normal HR. Morphine, O2, NTG. Tele, cardiac monitoring.  Cardiology had done cath- found blockages and stent was placed in LCx. Advise to cont same meds.  2.) HTN: c/w Losartan.  3.) HLD/TIA: c/w ASA, Plavix, Statin, Zetia, Lovaza.  4.) FEN/GI: Cardiac diet.  5.) DVT PPx: Full-dose therapeutic anticoagulation w/ heparin gtt.  6.) Code status: Full code.    DISCHARGE CONDITIONS:   Stable.   CONSULTS OBTAINED:  Treatment Team:  Arta Silence, MD End, Harrell Gave, MD  DRUG ALLERGIES:   Allergies  Allergen Reactions  . Crestor [Rosuvastatin Calcium]     Joint pain.  He is able to take a low dose every other day.   . Lipitor [Atorvastatin Calcium]     Joint pain  . Zocor [Simvastatin]     Joint pain    DISCHARGE MEDICATIONS:   Allergies as of 08/20/2017      Reactions   Crestor  [rosuvastatin Calcium]    Joint pain.  He is able to take a low dose every other day.    Lipitor [atorvastatin Calcium]    Joint pain   Zocor [simvastatin]    Joint pain      Medication List    TAKE these medications   aspirin 81 MG tablet Take 81 mg daily by mouth.   cilostazol 100 MG tablet Commonly known as:  PLETAL Take 1 tablet by mouth daily.   clopidogrel 75 MG tablet Commonly known as:  PLAVIX Take 1 tablet (75 mg total) by mouth daily.   ezetimibe 10 MG tablet Commonly known as:  ZETIA Take 1 tablet (10 mg total) by mouth daily.   losartan 50 MG tablet Commonly known as:  COZAAR Take 1 tablet (50 mg total) by mouth daily.   nitroGLYCERIN 0.4 MG SL tablet Commonly known as:  NITROSTAT Place 1 tablet (0.4 mg total) under the tongue every 5 (five) minutes x 3 doses as needed for chest pain.   omega-3 acid ethyl esters 1 g capsule Commonly known as:  LOVAZA Take 1 g by mouth daily.   rosuvastatin 5 MG tablet Commonly known as:  CRESTOR Take 1 tablet (5 mg total) by mouth at bedtime. What changed:  when to take this        DISCHARGE INSTRUCTIONS:    Follow with Cardiology  clinic next week.  If you experience worsening of your admission symptoms, develop shortness of breath, life threatening emergency, suicidal or homicidal thoughts you must seek medical attention immediately by calling 911 or calling your MD immediately  if symptoms less severe.  You Must read complete instructions/literature along with all the possible adverse reactions/side effects for all the Medicines you take and that have been prescribed to you. Take any new Medicines after you have completely understood and accept all the possible adverse reactions/side effects.   Please note  You were cared for by a hospitalist during your hospital stay. If you have any questions about your discharge medications or the care you received while you were in the hospital after you are discharged, you  can call the unit and asked to speak with the hospitalist on call if the hospitalist that took care of you is not available. Once you are discharged, your primary care physician will handle any further medical issues. Please note that NO REFILLS for any discharge medications will be authorized once you are discharged, as it is imperative that you return to your primary care physician (or establish a relationship with a primary care physician if you do not have one) for your aftercare needs so that they can reassess your need for medications and monitor your lab values.    Today   CHIEF COMPLAINT:   Chief Complaint  Patient presents with  . Chest Pain    HISTORY OF PRESENT ILLNESS:  Mario [name]  is a 57 y.o. male with a known history of HTN, HLD, CAD/MI (s/p stent x3, 2004), TIA, GERD, OSA, DJD/OA who p/w 1mo Hx chest "pain", severe x1d. Pt states that for the past 63mo, he has been experiencing intermittent pain/burning sensation in his L chest and upper chest, w/ this burning sensation radiating to the L arm and B/L neck. He states his LUE sometimes goes numb as well. He has a difficult time characterizing the pain, stating it is neither sharp nor dull, but, "just feels different." He states the pain never occurs during the day, and rather, it only happens at night, tending to wake him from sleep. However, it does not occur every single night.  On Monday (08/18/2017) evening, pt states that he felt generally unwell, citing low-grade fever, diaphoresis, fatigue/malaise, generalized weakness and feeling "run down". He denies sick contacts. He states that he went to bed early, @~1900PM. He states he was woken up out of sleep @~0000 (midnight) w/ aforementioned chest "pain" (burning), though he states the symptoms were more severe than they had been in the previous month, w/ pain radiating to the L jaw (which he says is uncharacteristic). He endorses intermittent midback soreness between the shoulder  blades, though this pain has no correlation with his chest "pain". He denies chills, N/V, diarrhea/AP, SOB, palpitations, HA, vertigo, blurred vision, LH/LOC, urinary symptoms. Pt sought hospitalization based on severity of symptoms, as well as similarity of his symptoms to those he experienced when he had a prior MI in 2004 (which the pt's wife states are almost identical). Pt is comfortable and well-appaering, and is in no acute distress at the time of my Hx/examination.  Pt's outpt Cardiologist is Dr. Acie Fredrickson, whom pt believes he saw ~32mo ago, prior to onset of his current symptoms. He is on ASA + Plavix at home, which he takes daily. He has not had stress test, "in a long time." [facility] Cardiology group consulted from ED, case d/w Dr. Rayann Heman. Pt ordered  for heparin gtt, admitted for ACS evaluation.   VITAL SIGNS:  Blood pressure 125/86, pulse 70, temperature 98.1 F (36.7 C), temperature source Oral, resp. rate 18, height 5\' 9"  (1.753 m), weight 99.8 kg (220 lb), SpO2 96 %.  I/O:    Intake/Output Summary (Last 24 hours) at 08/20/2017 0858 Last data filed at 08/20/2017 0300 Gross per 24 hour  Intake 1011.53 ml  Output 300 ml  Net 711.53 ml    PHYSICAL EXAMINATION:  GENERAL:  57 y.o.-year-old patient lying in the bed with no acute distress.  EYES: Pupils equal, round, reactive to light and accommodation. No scleral icterus. Extraocular muscles intact.  HEENT: Head atraumatic, normocephalic. Oropharynx and nasopharynx clear.  NECK:  Supple, no jugular venous distention. No thyroid enlargement, no tenderness.  LUNGS: Normal breath sounds bilaterally, no wheezing, rales,rhonchi or crepitation. No use of accessory muscles of respiration.  CARDIOVASCULAR: S1, S2 normal. No murmurs, rubs, or gallops.  ABDOMEN: Soft, non-tender, non-distended. Bowel sounds present. No organomegaly or mass.  EXTREMITIES: No pedal edema, cyanosis, or clubbing.  NEUROLOGIC: Cranial nerves II through XII are  intact. Muscle strength 5/5 in all extremities. Sensation intact. Gait not checked.  PSYCHIATRIC: The patient is alert and oriented x 3.  SKIN: No obvious rash, lesion, or ulcer.   DATA REVIEW:   CBC Recent Labs  Lab 08/20/17 0409  WBC 4.9  HGB 14.3  HCT 41.4  PLT 201    Chemistries  Recent Labs  Lab 08/19/17 0242 08/20/17 0409  NA 137 139  K 4.1 4.0  CL 104 108  CO2 24 25  GLUCOSE 96 97  BUN 11 9  CREATININE 0.99 0.94  CALCIUM 8.8* 8.6*  AST 27  --   ALT 30  --   ALKPHOS 56  --   BILITOT 0.6  --     Cardiac Enzymes Recent Labs  Lab 08/19/17 0733  TROPONINI <0.03    Microbiology Results  Results for orders placed or performed in visit on 01/29/16  Microscopic Examination     Status: None   Collection Time: 01/29/16  3:37 PM  Result Value Ref Range Status   WBC, UA None seen 0 - 5 /hpf Final   RBC, UA 0-2 0 - 2 /hpf Final   Epithelial Cells (non renal) 0-10 0 - 10 /hpf Final    RADIOLOGY:  Dg Chest 2 View  Result Date: 08/19/2017 CLINICAL DATA:  Chest pain EXAM: CHEST - 2 VIEW COMPARISON:  None. FINDINGS: Mild bronchitic changes. No consolidation or effusion. Normal heart size. No pneumothorax. Mild degenerative changes of the spine. IMPRESSION: No active cardiopulmonary disease.  Mild bronchitic changes. Electronically Signed   By: Donavan Foil M.D.   On: 08/19/2017 02:41    EKG:   Orders placed or performed during the hospital encounter of 08/19/17  . ED EKG  . ED EKG  . EKG 12-Lead  . EKG 12-Lead  . ED EKG  . ED EKG  . EKG 12-Lead  . EKG 12-Lead  . EKG 12-Lead immediately post procedure  . EKG 12-Lead  . EKG 12-Lead immediately post procedure  . EKG 12-Lead  . EKG 12-Lead  . EKG 12-Lead      Management plans discussed with the patient, family and they are in agreement.  CODE STATUS:     Code Status Orders  (From admission, onward)        Start     Ordered   08/19/17 0620  Full code  Continuous  08/19/17 9476    Code  Status History    This patient has a current code status but no historical code status.    Advance Directive Documentation     Most Recent Value  Type of Advance Directive  Healthcare Power of Attorney  Pre-existing out of facility DNR order (yellow form or pink MOST form)  -  "MOST" Form in Place?  -      TOTAL TIME TAKING CARE OF THIS PATIENT: 35 minutes.    Vaughan Basta M.D on 08/20/2017 at 8:58 AM  Between 7am to 6pm - Pager - [phone]  After 6pm go to [email or web address] - password [email or web address] [place] [facility]  Office  [phone]  CC: Primary care physician; [name], MD   Note: This dictation was prepared with Dragon dictation along with smaller phrase technology. Any transcriptional errors that result from this process are unintentional.

## 2017-08-20 NOTE — Progress Notes (Signed)
Progress Note  Patient Name: Mario [name] Date of Encounter: 08/20/2017  Primary Cardiologist: Nahser  Subjective   Feels well this morning. No further angina decubitus. Vitals stable. Tolerating medications. Right radial cath site mildly sore. Has ambulated in his room. LDL 86. Post-cath labs stable. Wants to go home.   Inpatient Medications    Scheduled Meds: . aspirin EC  81 mg Oral Daily  . cilostazol  100 mg Oral Daily  . clopidogrel  75 mg Oral Daily  . enoxaparin (LOVENOX) injection  40 mg Subcutaneous Q24H  . ezetimibe  10 mg Oral Daily  . losartan  50 mg Oral Daily  . omega-3 acid ethyl esters  1 g Oral Daily  . rosuvastatin  5 mg Oral Daily  . sodium chloride flush  3 mL Intravenous Q12H   Continuous Infusions: . sodium chloride     PRN Meds: sodium chloride, acetaminophen, bisacodyl, guaiFENesin-dextromethorphan, morphine injection **OR** morphine injection, nitroGLYCERIN, ondansetron (ZOFRAN) IV, senna-docusate, sodium chloride flush   Vital Signs    Vitals:   08/19/17 1725 08/19/17 1919 08/20/17 0326 08/20/17 0729  BP: 131/81 123/87 124/84 125/86  Pulse: (!) 59 66 63 70  Resp:  18 18 18   Temp:  98.4 F (36.9 C) 98.1 F (36.7 C)   TempSrc:  Oral Oral   SpO2:  97% 96% 96%  Weight:      Height:        Intake/Output Summary (Last 24 hours) at 08/20/2017 0921 Last data filed at 08/20/2017 0300 Gross per 24 hour  Intake 1011.53 ml  Output 300 ml  Net 711.53 ml   Filed Weights   08/19/17 0220 08/19/17 0622  Weight: 225 lb (102.1 kg) 220 lb (99.8 kg)    Telemetry    NSR - Personally Reviewed  ECG    n/a - Personally Reviewed  Physical Exam   GEN: No acute distress.   Neck: No JVD. Cardiac: RRR, no murmurs, rubs, or gallops. Right radial cardiac cath site without bleeding, bruising, swelling, erythema, or warmth. Mild TTP. Radial pulse 2+.  Respiratory: Clear to auscultation bilaterally.  GI: Soft, nontender, non-distended.   MS: No  edema; No deformity. Neuro:  Alert and oriented x 3; Nonfocal.  Psych: Normal affect.  Labs    Chemistry Recent Labs  Lab 08/19/17 0242 08/20/17 0409  NA 137 139  K 4.1 4.0  CL 104 108  CO2 24 25  GLUCOSE 96 97  BUN 11 9  CREATININE 0.99 0.94  CALCIUM 8.8* 8.6*  PROT 6.6  --   ALBUMIN 4.2  --   AST 27  --   ALT 30  --   ALKPHOS 56  --   BILITOT 0.6  --   GFRNONAA >60 >60  GFRAA >60 >60  ANIONGAP 9 6     Hematology Recent Labs  Lab 08/19/17 0242 08/20/17 0409  WBC 6.1 4.9  RBC 4.67 4.44  HGB 14.6 14.3  HCT 42.9 41.4  MCV 91.9 93.3  MCH 31.3 32.1  MCHC 34.1 34.4  RDW 13.4 13.5  PLT 213 201    Cardiac Enzymes Recent Labs  Lab 08/19/17 0242 08/19/17 0733  TROPONINI <0.03 <0.03   No results for input(s): TROPIPOC in the last 168 hours.   BNPNo results for input(s): BNP, PROBNP in the last 168 hours.   DDimer No results for input(s): DDIMER in the last 168 hours.   Radiology    Dg Chest 2 View  Result Date: 08/19/2017  IMPRESSION: No active cardiopulmonary disease.  Mild bronchitic changes. Electronically Signed   By: Donavan Foil M.D.   On: 08/19/2017 02:41    Cardiac Studies   LHC 08/19/2017: Conclusion   Conclusions: 1. Significant single-vessel coronary artery disease involving dominant LCx and OM1, as detailed below. 2. Normal left ventricular contraction with mildly elevated left ventricular filling pressure. 3. Successful FFR-guided bifurcation PCI to LCx and OM1 with balloon angioplasty of in-stent restenosis (2 layers of overlapping stent from 2004) and drug-eluting stent placement to the jailed AV groove LCx (T-stenting technique).  There is less than 10% residual stenosis in the LCx/OM1 and TIMI-3 flow.  Recommendations: 1. Aggressive secondary prevention including tobacco cessation and escalation of lipid therapy. 2. Indefinite dual antiplatelet therapy. 3. Outpatient cardiac rehab and close follow-up with Dr. Acie Fredrickson after discharge.      Patient Profile     57 y.o. male with history of CAD s/p prior remote stenting in 2004, possible TIA, PVD, HTN, and HLD who is being seen today for the evaluation of unstable angina.  Assessment & Plan    1. CAD of the native coronary arteries with unstable angina: -Angina decubitus resolved following PCI as above -ASA and Plavix for at least the next 12 months, likely indefinitely given multiple layers of stents -Aggressive risk factor modification and secondary prevention (reports he has quit smoking/dipping) -LDL of 86, as below -Cardiac rehab -Not on beta blocker given heart rates in the 50s to low 60s bpm, consider adding Coreg as an outpatient  -Post cath instructions discussed in detail with patient and IM  2. PVD: -Pletal -Outpatient follow up  3. HLD: -LDL of 86 -Increase frequency of Crestor from every other day to daily, may need to escalate dose an outpatient (has previously noted myalgias/arthralgias on higher doses) -Continue Crestor, Zetia, Lovaza -If LDL not at goal of < 70, refer to lipid clinic as an outpatient for possible PCSK-9 inhibitor   4. HTN: -BP improved -Continue current medications  5. History of possible TIA: -As above    For questions or updates, please contact [place] Please consult [email or web address] for contact info under Cardiology/STEMI.    Signed, Christell Faith, PA-C [place] Pager: [phone] 08/20/2017, 9:21 AM

## 2017-08-20 NOTE — Progress Notes (Signed)
[place], [place].   08/20/2017  Patient: Mario [name]   Date of Birth:  1961-01-25  Date of admission:  08/19/2017  Date of Discharge  08/20/2017    To Whom it May Concern:   Mario [name]  may return to work on 08/27/17 after visiting cardiology clinic.[name]  PHYSICAL ACTIVITY:  Moderate- no straneous physical activities for 1 week until seen by cardiologist.   If you have any questions or concerns, please don't hesitate to call.  Sincerely,   Vaughan Basta M.D Pager [phone] Office : [phone]   .

## 2017-08-20 NOTE — Telephone Encounter (Signed)
Pt's wife is calling and stated she has questions concerning the cholesterol injections and  About the life span of the stent that was put in. Please advise pt's wife

## 2017-08-21 NOTE — Telephone Encounter (Signed)
Called Rodneys wife. Jesson is drinking lots of beer  Started smoking again  Will discuss these issues at next office visit

## 2017-08-25 ENCOUNTER — Telehealth: Payer: Self-pay

## 2017-08-29 NOTE — Telephone Encounter (Signed)
Note to close encounter.  

## 2017-09-01 ENCOUNTER — Ambulatory Visit (INDEPENDENT_AMBULATORY_CARE_PROVIDER_SITE_OTHER): Payer: Managed Care, Other (non HMO) | Admitting: Surgery

## 2017-09-01 ENCOUNTER — Encounter: Payer: Self-pay | Admitting: Surgery

## 2017-09-01 ENCOUNTER — Ambulatory Visit (INDEPENDENT_AMBULATORY_CARE_PROVIDER_SITE_OTHER)
Admission: RE | Admit: 2017-09-01 | Discharge: 2017-09-01 | Disposition: A | Discharge status: Home / Self Care | Payer: Managed Care, Other (non HMO) | Source: Ambulatory Visit | Attending: Surgery | Admitting: Surgery

## 2017-09-01 ENCOUNTER — Ambulatory Visit (HOSPITAL_COMMUNITY)
Admission: RE | Admit: 2017-09-01 | Discharge: 2017-09-01 | Disposition: A | Discharge status: Home / Self Care | Payer: Managed Care, Other (non HMO) | Source: Ambulatory Visit | Attending: Surgery | Admitting: Surgery

## 2017-09-01 ENCOUNTER — Other Ambulatory Visit: Payer: Self-pay

## 2017-09-01 VITALS — BP 121/84 | HR 69 | Resp 20 | Ht 69.0 in | Wt 224.0 lb

## 2017-09-01 DIAGNOSIS — I70219 Atherosclerosis of native arteries of extremities with intermittent claudication, unspecified extremity: Secondary | ICD-10-CM

## 2017-09-01 DIAGNOSIS — I10 Essential (primary) hypertension: Secondary | ICD-10-CM | POA: Insufficient documentation

## 2017-09-01 DIAGNOSIS — E785 Hyperlipidemia, unspecified: Secondary | ICD-10-CM | POA: Insufficient documentation

## 2017-09-01 DIAGNOSIS — I2510 Atherosclerotic heart disease of native coronary artery without angina pectoris: Secondary | ICD-10-CM | POA: Diagnosis not present

## 2017-09-01 DIAGNOSIS — I70211 Atherosclerosis of native arteries of extremities with intermittent claudication, right leg: Secondary | ICD-10-CM | POA: Insufficient documentation

## 2017-09-01 NOTE — Progress Notes (Signed)
[facility] [place]  Patient name: Mario [name] MRN: [id number] DOB: 1960-11-04 Sex: male   REASON FOR VISIT:    Follow up claudication  HISOTRY OF PRESENT ILLNESS:    Mario [name] is a 57 y.o. male who returns today for follow-up of his right leg claudication.  I initially saw him back in February.  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  I elected to initially treat him with non-invasive measures.  Fortunately, he has stopped all tobacco products as well as alcohol.  He started taking Pletal which he feels has made an improvement.  In addition he went in for cardiac symptoms and was found to have stenosis within all 3 of his previously placed coronary stents which were successfully dilated.  Now he is without any significant limitations from his legs.  He continues to take a statin for hypercholesterolemia and an ARB for his hypertension.  He is on dual antiplatelet therapy with aspirin and Plavix.   PAST MEDICAL HISTORY:   Past Medical History:  Diagnosis Date  . Arthritis   . CAD (coronary artery disease)   . HLD (hyperlipidemia)   . Recurrent chest pain   . Sleep apnea    cpap     FAMILY HISTORY:   Family History  Problem Relation Age of Onset  . Hyperlipidemia Mother   . Hypertension Mother   . Heart disease Mother   . Heart failure Mother        59  . Lung cancer Mother   . Leukemia Father   . Hypertension Sister   . Hypertension Sister   . Coronary artery disease Unknown     SOCIAL HISTORY:   Social History   Tobacco Use  . Smoking status: Former Smoker    Last attempt to quit: 04/22/2001    Years since quitting: 16.3  . Smokeless tobacco: Former Systems developer    Types: Snuff  Substance Use Topics  . Alcohol use: Yes    Comment: 2 beers per day     ALLERGIES:   Allergies  Allergen Reactions  . Crestor  [Rosuvastatin Calcium]     Joint pain.  He is able to take a low dose every other day.   . Lipitor [Atorvastatin Calcium]     Joint pain  . Zocor [Simvastatin]     Joint pain     CURRENT MEDICATIONS:   Current Outpatient Medications  Medication Sig Dispense Refill  . aspirin 81 MG tablet Take 81 mg daily by mouth.     . cilostazol (PLETAL) 100 MG tablet Take 1 tablet by mouth daily.    . clopidogrel (PLAVIX) 75 MG tablet Take 1 tablet (75 mg total) by mouth daily. 90 tablet 3  . ezetimibe (ZETIA) 10 MG tablet Take 1 tablet (10 mg total) by mouth daily. 90 tablet 3  . losartan (COZAAR) 50 MG tablet Take 1 tablet (50 mg total) by mouth daily. 90 tablet 3  . nitroGLYCERIN (NITROSTAT) 0.4 MG SL tablet Place 1 tablet (0.4 mg total) under the tongue every 5 (five) minutes x 3 doses as needed for chest pain. 20 tablet 12  . omega-3 acid ethyl esters (LOVAZA) 1 g capsule Take 1 g by mouth daily.    . rosuvastatin (CRESTOR) 5 MG tablet Take 1 tablet (5 mg total) by mouth at bedtime. 30 tablet 0   No current facility-administered medications for this visit.  REVIEW OF SYSTEMS:   [X]  denotes positive finding, [ ]  denotes negative finding Cardiac  Comments:  Chest pain or chest pressure: x   Shortness of breath upon exertion: x   Short of breath when lying flat:    Irregular heart rhythm:        Vascular    Pain in calf, thigh, or hip brought on by ambulation:    Pain in feet at night that wakes you up from your sleep:     Blood clot in your veins:    Leg swelling:         Pulmonary    Oxygen at home:    Productive cough:     Wheezing:         Neurologic    Sudden weakness in arms or legs:     Sudden numbness in arms or legs:     Sudden onset of difficulty speaking or slurred speech:    Temporary loss of vision in one eye:     Problems with dizziness:         Gastrointestinal    Blood in stool:     Vomited blood:         Genitourinary    Burning when urinating:       Blood in urine:        Psychiatric    Major depression:         Hematologic    Bleeding problems:    Problems with blood clotting too easily:        Skin    Rashes or ulcers:        Constitutional    Fever or chills:      PHYSICAL EXAM:   Vitals:   09/01/17 1236  BP: 121/84  Pulse: 69  Resp: 20  SpO2: 97%  Weight: 224 lb (101.6 kg)  Height: 5\' 9"  (1.753 m)    GENERAL: The patient is a well-nourished male, in no acute distress. The vital signs are documented above. CARDIAC: There is a regular rate and rhythm.  PULMONARY: Non-labored respirations MUSCULOSKELETAL: There are no major deformities or cyanosis. NEUROLOGIC: No focal weakness or paresthesias are detected. SKIN: There are no ulcers or rashes noted. PSYCHIATRIC: The patient has a normal affect.  STUDIES:   I have ordered and reviewed his vascular lab studies with the following findings: Right ABI = 0.66  Left ABI = 1.11  MEDICAL ISSUES:   Claudication: Patient symptoms have almost completely resolved with coronary intervention and Pletal.  He was congratulated on his abstinence from tobacco and alcohol.  I have him scheduled for follow-up in 1 year with repeat ABIs.  He knows to contact me sooner if he develops worsening symptoms.    Annamarie Major, MD [facility] [phone] Pager [phone]

## 2017-09-02 ENCOUNTER — Encounter: Payer: Self-pay | Admitting: Cardiovascular Disease

## 2017-09-02 ENCOUNTER — Ambulatory Visit (INDEPENDENT_AMBULATORY_CARE_PROVIDER_SITE_OTHER): Payer: Managed Care, Other (non HMO) | Admitting: Cardiovascular Disease

## 2017-09-02 VITALS — BP 130/82 | HR 67 | Ht 69.0 in | Wt 225.0 lb

## 2017-09-02 DIAGNOSIS — Z789 Other specified health status: Secondary | ICD-10-CM

## 2017-09-02 DIAGNOSIS — E782 Mixed hyperlipidemia: Secondary | ICD-10-CM | POA: Diagnosis not present

## 2017-09-02 NOTE — Patient Instructions (Addendum)
Medication Instructions:  Your physician recommends that you continue on your current medications as directed. Please refer to the Current Medication list given to you today.   Labwork: None Ordered   Testing/Procedures: None Ordered   Follow-Up: You have been referred to Lipid Clinic ([place] [facility]) for initiation of PCSK9   Your physician recommends that you schedule a follow-up appointment in: 6 weeks with Dr. Acie Fredrickson   If you need a refill on your cardiac medications before your next appointment, please call your pharmacy.   Thank you for choosing [facility]! Christen Bame, RN [phone]

## 2017-09-02 NOTE — Progress Notes (Signed)
Mario [name] Date of Birth  30-Jul-1960       Mario [name]     [address] [place]   Mario [name]  [id number]    [phone]      Fax  [phone]      Problem List: 1.  Coronary artery disease-status post PTCA and stenting in 2004 2.  Hyperlipidemia 3. Hypertension     [name] is a 57 yo with hx of CAD.,  He's had an upper respiratory tract viral infection for the past month or so.  He denies any chest pain.  He has been able to do of his normal activities without any significant problems. He works at the [employer] in [place].  [name]. 26, 2015: Mario [name] is doing well.  No angina.   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   BP has been elevated .  He has been tried on HCTZ in the past but it made him feel poorly.    August 17, 2013:  November 16, 2013: Bodies doing well from a cardiac standpoint. His medical doctor recently changed his Nexium to products because of the interaction and potential in addition of Plavix metabolism. He's noted that he's had lots more indigestion and heartburn since stopping the Nexium.  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 We discussed checking a P2 Y  12 assay with him on Plavix and Nexium to prove that he is still inhibited.  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 He notes that he bleeds quite readily if he cuts himself.  November 06, 2015: Mario [name] is seen after a 2 year absence. He has a history of coronary artery disease and hypertension BP has been elevated  Still eats some salty foods.  No CP,  Still very active   July 01, 2016:  Mario [name] has been having some problems with elevated blood pressure recently.   He had a question of some mental status changes March  1. .? TIA symptoms .  Slurring works, [name]  ,   BP was better  170/100. No further confusion .    Has gained 20 lbs over the winter.   Not getting any exercise.   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, 2018:  Mario [name] is seen today . Having right calf claudication  ABIs look great.  Dr. Lucky Cowboy wants to do a PV angiogram  [facility] wants a 2nd opinion.   Sep 02, 2017:  Right is seen back today for follow-up visit.  Seen with his wife ,  Mario [name] .    Since I last saw him he has had coronary stenting to the proximal left circumflex artery and obtuse marginal artery.  He is started back smoking again unfortunately. Has now stopped smoking as of this month. Trying to exercise ,  Walking some  Says his claudication is better  Lower extremity arterial duplex scan reveals a right 75 to 99% stenosis in the distal SFA Left ankle-brachial indexes are normal.  Was walking recently.  Has some dizziness after walking 1 miles   LDL was 86.    Chol = 185      Current Outpatient Medications on File Prior to Visit  Medication Sig Dispense Refill  . aspirin 81 MG tablet Take 81 mg daily by mouth.     . cilostazol (PLETAL) 100 MG tablet Take 1 tablet by mouth daily.    . clopidogrel (PLAVIX) 75 MG tablet Take 1 tablet (75 mg total) by mouth daily. 90 tablet 3  . ezetimibe (ZETIA) 10 MG tablet Take 1 tablet (10 mg total) by mouth daily. 90 tablet 3  . losartan (COZAAR) 50 MG tablet Take 1 tablet (50 mg total) by mouth daily. 90 tablet 3  . nitroGLYCERIN (NITROSTAT) 0.4 MG SL tablet Place 1 tablet (0.4 mg total) under the tongue every 5 (five) minutes x 3 doses as needed for chest pain. 20 tablet 12  . omega-3 acid ethyl esters (LOVAZA) 1 g capsule Take 1 g by mouth daily.    . rosuvastatin (CRESTOR) 5 MG tablet Take 1 tablet (5 mg total) by mouth  at bedtime. 30 tablet 0   No current facility-administered medications on file prior to visit.     Allergies  Allergen Reactions  . Crestor [Rosuvastatin Calcium]     Joint pain.  He is able to take a low dose every other day.   . Lipitor [Atorvastatin Calcium]     Joint pain  . Zocor [Simvastatin]     Joint pain    Past Medical History:  Diagnosis Date  . Arthritis   . CAD (coronary artery disease)   . HLD (hyperlipidemia)   . Recurrent chest pain   . Sleep apnea    cpap    Past Surgical History:  Procedure Laterality Date  . COLONOSCOPY WITH PROPOFOL N/A 11/17/2015   Procedure: COLONOSCOPY WITH PROPOFOL;  Surgeon: Lucilla Lame, MD;  Location: [facility];  Service: Endoscopy;  Laterality: N/A;  CPAP  . CORONARY STENT INTERVENTION N/A 08/19/2017   Procedure: CORONARY STENT INTERVENTION;  Surgeon: Nelva Bush, MD;  Location: [facility];  Service: Cardiovascular;  Laterality: N/A;  . INTRAVASCULAR PRESSURE WIRE/FFR STUDY N/A 08/19/2017   Procedure: INTRAVASCULAR PRESSURE WIRE/FFR STUDY;  Surgeon: Nelva Bush, MD;  Location: [facility];  Service: Cardiovascular;  Laterality: N/A;  . LEFT HEART CATH AND CORONARY ANGIOGRAPHY N/A 08/19/2017   Procedure: LEFT HEART CATH AND CORONARY ANGIOGRAPHY;  Surgeon: Nelva Bush, MD;  Location: [facility];  Service: Cardiovascular;  Laterality: N/A;  . NASAL SINUS SURGERY    . POLYPECTOMY N/A 11/17/2015   Procedure: POLYPECTOMY;  Surgeon: Lucilla Lame, MD;  Location: [facility];  Service: Endoscopy;  Laterality: N/A;  sigmoid polyp  . PTCA     3 stents    Social History   Tobacco Use  Smoking Status Former Smoker  . Last attempt to quit: 04/22/2001  . Years since quitting: 16.3  Smokeless Tobacco Former Systems developer  . Types: Snuff    Social History   Substance and Sexual Activity  Alcohol Use Yes   Comment: 2 beers per day    Family History  Problem Relation Age of Onset  .  Hyperlipidemia Mother   . Hypertension Mother   . Heart disease Mother   . Heart failure Mother        25  . Lung cancer Mother   . Leukemia Father   . Hypertension Sister   . Hypertension Sister   .  Coronary artery disease Unknown     Reviw of Systems:  Reviewed in the HPI.  All other systems are negative.  Physical Exam: Blood pressure 130/82, pulse 67, height 5\' 9"  (1.753 m), weight 225 lb (102.1 kg), SpO2 99 %.  GEN:  Well nourished, well developed in no acute distress HEENT: Normal NECK: No JVD; No carotid bruits LYMPHATICS: No lymphadenopathy CARDIAC: RRR   RESPIRATORY:  Clear to auscultation without rales, wheezing or rhonchi  ABDOMEN: Soft, non-tender, non-distended MUSCULOSKELETAL:  No edema; No deformity  SKIN: Warm and dry NEUROLOGIC:  Alert and oriented x 3    ECG:   May 01, 2017: Normal sinus rhythm at 65.  EKG is normal.  Assessment / Plan:   1.   CAD :   S/p stenting and Cutting Balloon to the left circumflex marginal system.  He already has 2 layers of stents in that area.  At this point we need very aggressive lipid management.  We will refer him to the lipid clinic for consideration of PCSK9 inhibitor.  He is not having any significant angina.  He is having some lightheadedness.  We will have him continue to ambulate and watch his lightheadedness very closely.  He might need some isosorbide but will hold off and wait and see how he does with this dizziness/lightheadedness.  2.  Peripheral vascular disease: He has a significant stenosis in his distal SFA.  The claudication seems to be getting better..  .  3.  Hyperlipidemia: He is not tolerating the Crestor all that well.  We will send him to lipid clinic for consideration of PCSK9 inhibitors.      Mertie Moores, MD  09/02/2017 7:52 AM    [address],  [place] [place], [place]  [address] Pager [phone] Phone: [phone]; Fax: [phone]

## 2017-09-16 ENCOUNTER — Ambulatory Visit: Payer: Self-pay | Admitting: Cardiovascular Disease

## 2017-09-25 ENCOUNTER — Ambulatory Visit (INDEPENDENT_AMBULATORY_CARE_PROVIDER_SITE_OTHER): Payer: Managed Care, Other (non HMO) | Admitting: Pharmacist

## 2017-09-25 DIAGNOSIS — I25119 Atherosclerotic heart disease of native coronary artery with unspecified angina pectoris: Secondary | ICD-10-CM

## 2017-09-25 NOTE — Patient Instructions (Signed)
Lipid Clinic (pharmacist)  *Will start paperwork for Praluent 150mg  every 14 days* *Plan to repeat fasting blood work 6-8 weeks after starting Praluent*   Cholesterol Cholesterol is a fat. Your body needs a small amount of cholesterol. Cholesterol (plaque) may build up in your blood vessels (arteries). That makes you more likely to have a heart attack or stroke. You cannot feel your cholesterol level. Having a blood test is the only way to find out if your level is high. Keep your test results. Work with your doctor to keep your cholesterol at a good level. What do the results mean?  Total cholesterol is how much cholesterol is in your blood.  LDL is bad cholesterol. This is the type that can build up. Try to have low LDL.  HDL is good cholesterol. It cleans your blood vessels and carries LDL away. Try to have high HDL.  Triglycerides are fat that the body can store or burn for energy. What are good levels of cholesterol?  Total cholesterol below 200.  LDL below 100 is good for people who have health risks. LDL below 70 is good for people who have very high risks.  HDL above 40 is good. It is best to have HDL of 60 or higher.  Triglycerides below 150. How can I lower my cholesterol? Diet Follow your diet program as told by your doctor.  Choose fish, white meat chicken, or [place] that is roasted or baked. Try not to eat red meat, fried foods, sausage, or lunch meats.  Eat lots of fresh fruits and vegetables.  Choose whole grains, beans, pasta, potatoes, and cereals.  Choose olive oil, corn oil, or canola oil. Only use small amounts.  Try not to eat butter, mayonnaise, shortening, or palm kernel oils.  Try not to eat foods with trans fats.  Choose low-fat or nonfat dairy foods. ? Drink skim or nonfat milk. ? Eat low-fat or nonfat yogurt and cheeses. ? Try not to drink whole milk or cream. ? Try not to eat ice cream, egg yolks, or full-fat cheeses.  Healthy desserts  include angel food cake, ginger snaps, animal crackers, hard candy, popsicles, and low-fat or nonfat frozen yogurt. Try not to eat pastries, cakes, pies, and cookies.  Exercise Follow your exercise program as told by your doctor.  Be more active. Try gardening, walking, and taking the stairs.  Ask your doctor about ways that you can be more active.  Medicine  Take over-the-counter and prescription medicines only as told by your doctor. This information is not intended to replace advice given to you by your health care provider. Make sure you discuss any questions you have with your health care provider. Document Released: 07/05/2008 Document Revised: 11/08/2015 Document Reviewed: 10/19/2015 Elsevier Interactive Patient Education  [employer].

## 2017-09-25 NOTE — Progress Notes (Signed)
Patient ID: Mario [name]                 DOB: [birth year]                    MRN: [id number]      HPI: Mario [name] is a 57 y.o. male patient referred to lipid clinic by Dr Acie Fredrickson. PMH is significant for CAD s/p stent placement in 2004, hypertension, hyperlipidemia, TIA, and unstable angina. Patient currently taking ezetimibe 10mg  daily plus rosuvastatin 5mg  daily . He reports some muscle pain with current statin use but will like to continue statin therapy at this time decrease the risk of additonal coronary event.  He presents to clinic today for potential PCSK9i initiation.  Current Medications:  rosuvastatin 5mg  daily - muscle pain  Ezetimibe 10mg  daily  Intolerances:  Lipitor - joint pain simvastatin  - joint pain  LDL goal: <70 mg/dL  Diet: working on low fat diet. Decreasing mount of meat and fried food.  Exercise: activities of daily leaving  Family History: reports hyperlipidemia in mother; hypertension in mother and sister; heart failure in mother  Social History: stopped drinking 2 weeks ago, former smoker  Labs: CHO 185; TG 57; HDL 88; LDL 86 (rosuvastatin 5mg  plus ezetimibe 10mg )  Past Medical History:  Diagnosis Date  . Arthritis   . CAD (coronary artery disease)   . HLD (hyperlipidemia)   . Recurrent chest pain   . Sleep apnea    cpap    Current Outpatient Medications on File Prior to Visit  Medication Sig Dispense Refill  . aspirin 81 MG tablet Take 81 mg daily by mouth.     . cilostazol (PLETAL) 100 MG tablet Take 1 tablet by mouth daily.    . clopidogrel (PLAVIX) 75 MG tablet Take 1 tablet (75 mg total) by mouth daily. 90 tablet 3  . ezetimibe (ZETIA) 10 MG tablet Take 1 tablet (10 mg total) by mouth daily. 90 tablet 3  . losartan (COZAAR) 50 MG tablet Take 1 tablet (50 mg total) by mouth daily. 90 tablet 3  . nitroGLYCERIN (NITROSTAT) 0.4 MG SL tablet Place 1 tablet (0.4 mg total) under the tongue every 5 (five) minutes x 3 doses as needed for chest  pain. 20 tablet 12  . rosuvastatin (CRESTOR) 5 MG tablet Take 1 tablet (5 mg total) by mouth at bedtime. 30 tablet 0   No current facility-administered medications on file prior to visit.     Allergies  Allergen Reactions  . Crestor [Rosuvastatin Calcium]     Joint pain.  He is able to take a low dose every other day.   . Lipitor [Atorvastatin Calcium]     Joint pain  . Zocor [Simvastatin]     Joint pain    CAD (coronary artery disease) LDL-c remains above goal for secondary prevention while on rosuvastatin and ezetimibe therapy. Patient is a good candidate for PCSK9i therapy at this time. Will initate paperwork for Praluent 150mg  prior authorization. Plan to repeat fasting lipid panel and LFTs 6-8 weeks after 1st injection.  Repatha/Praleunt indication, MOA, storage, administration, common side effect, prior-authorization process, and financial responsibility were discussed during counseling.      Catriona Dillenbeck Rodriguez-Guzman PharmD, BCPS, [place] [id number] 09/26/2017 3:36 PM

## 2017-09-26 ENCOUNTER — Encounter: Payer: Self-pay | Admitting: Pharmacist

## 2017-09-26 NOTE — Assessment & Plan Note (Signed)
LDL-c remains above goal for secondary prevention while on rosuvastatin and ezetimibe therapy. Patient is a good candidate for PCSK9i therapy at this time. Will initate paperwork for Praluent 150mg  prior authorization. Plan to repeat fasting lipid panel and LFTs 6-8 weeks after 1st injection.  Repatha/Praleunt indication, MOA, storage, administration, common side effect, prior-authorization process, and financial responsibility were discussed during counseling.

## 2017-10-01 ENCOUNTER — Telehealth: Payer: Self-pay | Admitting: Cardiovascular Disease

## 2017-10-01 NOTE — Telephone Encounter (Signed)
New Message   Dr. March Rummage is calling because a request for the pt to switch from [facility] was put in and he needs an ok to switch faxed to [phone], if not then he will deny it and it will have to be resubmitted

## 2017-10-01 NOTE — Telephone Encounter (Signed)
Talked to DR March Rummage and faxed requested information.  56 yo switch from [facility] to [facility].  Boss Danielsen Rodriguez-Guzman PharmD, BCPS, [place] [id number] 10/01/2017 2:00 PM

## 2017-10-02 ENCOUNTER — Other Ambulatory Visit: Payer: Self-pay | Admitting: Pharmacist

## 2017-10-02 MED ORDER — EVOLOCUMAB 140 MG/ML ~~LOC~~ SOAJ: 140.0000 mg | SUBCUTANEOUS | 3 refills | Status: DC

## 2017-10-02 NOTE — Telephone Encounter (Signed)
Repatha prior-auth was approved by insurance until 03/24/2018.  Rx sent to [facility] card provided - mailed to patient

## 2017-10-13 ENCOUNTER — Ambulatory Visit (INDEPENDENT_AMBULATORY_CARE_PROVIDER_SITE_OTHER): Payer: Managed Care, Other (non HMO) | Admitting: Cardiovascular Disease

## 2017-10-13 ENCOUNTER — Telehealth: Payer: Self-pay | Admitting: *Deleted

## 2017-10-13 ENCOUNTER — Encounter: Payer: Self-pay | Admitting: Cardiovascular Disease

## 2017-10-13 VITALS — BP 134/78 | HR 76 | Ht 69.0 in | Wt 227.4 lb

## 2017-10-13 DIAGNOSIS — E7800 Pure hypercholesterolemia, unspecified: Secondary | ICD-10-CM

## 2017-10-13 DIAGNOSIS — I10 Essential (primary) hypertension: Secondary | ICD-10-CM

## 2017-10-13 DIAGNOSIS — I2510 Atherosclerotic heart disease of native coronary artery without angina pectoris: Secondary | ICD-10-CM | POA: Diagnosis not present

## 2017-10-13 DIAGNOSIS — R079 Chest pain, unspecified: Secondary | ICD-10-CM | POA: Diagnosis not present

## 2017-10-13 DIAGNOSIS — E782 Mixed hyperlipidemia: Secondary | ICD-10-CM | POA: Diagnosis not present

## 2017-10-13 LAB — BASIC METABOLIC PANEL
BUN / CREAT RATIO: 9 (ref 9–20)
BUN: 10 mg/dL (ref 6–24)
CO2: 27 mmol/L (ref 20–29)
Calcium: 9.6 mg/dL (ref 8.7–10.2)
Chloride: 105 mmol/L (ref 96–106)
Creatinine, Ser: 1.12 mg/dL (ref 0.76–1.27)
GFR calc non Af Amer: 74 mL/min/{1.73_m2} (ref >59)
GFR, EST AFRICAN AMERICAN: 86 mL/min/{1.73_m2} (ref >59)
Glucose: 79 mg/dL (ref 65–99)
POTASSIUM: 4.3 mmol/L (ref 3.5–5.2)
Sodium: 140 mmol/L (ref 134–144)

## 2017-10-13 LAB — TROPONIN T

## 2017-10-13 MED ORDER — CLOPIDOGREL BISULFATE 75 MG PO TABS: 75.0000 mg | ORAL_TABLET | Freq: Every day | ORAL | 3 refills | Status: DC

## 2017-10-13 MED ORDER — LOSARTAN POTASSIUM 50 MG PO TABS: 50.0000 mg | ORAL_TABLET | Freq: Every day | ORAL | 3 refills | Status: DC

## 2017-10-13 MED ORDER — EZETIMIBE 10 MG PO TABS: 10.0000 mg | ORAL_TABLET | Freq: Every day | ORAL | 3 refills | Status: DC

## 2017-10-13 MED ORDER — ISOSORBIDE MONONITRATE ER 30 MG PO TB24: 30.0000 mg | ORAL_TABLET | Freq: Every day | ORAL | 11 refills | Status: DC

## 2017-10-13 MED ORDER — NITROGLYCERIN 0.4 MG SL SUBL: 0.4000 mg | SUBLINGUAL_TABLET | SUBLINGUAL | 6 refills | Status: DC | PRN

## 2017-10-13 NOTE — Telephone Encounter (Signed)
Routing to scheduling to contact patient and set up lab visit for on or around August 1 for LIpid, liver, bmet. Patient will need to be fasting. Thanks!

## 2017-10-13 NOTE — Patient Instructions (Signed)
Medication Instructions:  Your physician has recommended you make the following change in your medication:   START Imdur (Isosorbide mononitrate) 30 mg once daily   Labwork: TODAY - Troponin T, basic metabolic panel   Testing/Procedures: None Ordered   Follow-Up: Your physician recommends that you schedule a follow-up appointment in: 3 months with Dr. Acie Fredrickson   If you need a refill on your cardiac medications before your next appointment, please call your pharmacy.   Thank you for choosing [facility]! Christen Bame, RN [phone]

## 2017-10-13 NOTE — Addendum Note (Signed)
Addended by: Emmaline Life on: 10/13/2017 02:16 PM   Modules accepted: Orders

## 2017-10-13 NOTE — Telephone Encounter (Signed)
-----   Message from Emmaline Life, RN sent at 10/13/2017  2:07 PM EDT ----- Good afternoon,  Would you guys please schedule Dr. Elmarie Shiley patient for fasting lipid/liver/bmet on or around August 1 for Repatha therapy? He lives in [place] and it is much closer for him to go to your office.  Thank you! Sharyn Lull

## 2017-10-13 NOTE — Progress Notes (Signed)
Mario [name] Date of Birth  30-Jul-1960       [name]     [address] [place]   [name], Mario  [address]    [phone]      Fax  [phone]      Problem List: 1.  Coronary artery disease-status post PTCA and stenting in 2004 2.  Hyperlipidemia 3. Hypertension     Mario [name] is a 57 yo with hx of CAD.,  He's had an upper respiratory tract viral infection for the past month or so.  He denies any chest pain.  He has been able to do of his normal activities without any significant problems. He works at the [employer] in [place].  [name]. 26, 2015: Mario [name] is doing well.  No angina.   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   BP has been elevated .  He has been tried on HCTZ in the past but it made him feel poorly.    August 17, 2013:  November 16, 2013: Bodies doing well from a cardiac standpoint. His medical doctor recently changed his Nexium to products because of the interaction and potential in addition of Plavix metabolism. He's noted that he's had lots more indigestion and heartburn since stopping the Nexium.  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 We discussed checking a P2 Y  12 assay with him on Plavix and Nexium to prove that he is still inhibited.  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 He notes that he bleeds quite readily if he cuts himself.  November 06, 2015: Mario [name] is seen after a 2 year absence. He has a history of coronary artery disease and hypertension BP has been elevated  Still eats some salty foods.  No CP,  Still very active   July 01, 2016:  Mario [name] has been having some problems with elevated blood pressure recently.   He had a question of some mental status changes March  1. .? TIA symptoms .  Slurring works, [name]  ,   BP was better  170/100. No further confusion .    Has gained 20 lbs over the winter.   Not getting any exercise.   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, 2018:  Mario [name] is seen today . Having right calf claudication  ABIs look great.  Dr. Lucky Cowboy wants to do a PV angiogram  [facility] wants a 2nd opinion.   Sep 02, 2017:  Right is seen back today for follow-up visit.  Seen with his wife ,  Mario [name] .    Since I last saw him he has had coronary stenting to the proximal left circumflex artery and obtuse marginal artery.  He is started back smoking again unfortunately. Has now stopped smoking as of this month. Trying to exercise ,  Walking some  Says his claudication is better  Lower extremity arterial duplex scan reveals a right 75 to 99% stenosis in the distal SFA Left ankle-brachial indexes are normal.  Was walking recently.  Has some dizziness after walking 1 miles   LDL was 86.    Chol = 185  October 13, 2017:  Having some right sided neck , upper chest pain , bilateral upper chest pain Not  Exertional,  [name], dipping, drinking for the most part.    Current Outpatient Medications on File Prior to Visit  Medication Sig Dispense Refill  . aspirin 81 MG tablet Take 81 mg daily by mouth.     . cilostazol (PLETAL) 100 MG tablet Take 1 tablet by mouth daily.    . clopidogrel (PLAVIX) 75 MG tablet Take 1 tablet (75 mg total) by mouth daily. 90 tablet 3  . Evolocumab (REPATHA SURECLICK) 102 MG/ML SOAJ Inject 140 mg into the skin every 14 (fourteen) days. 6 pen 3  . ezetimibe (ZETIA) 10 MG tablet Take 1 tablet (10 mg total) by mouth daily. 90 tablet 3   . losartan (COZAAR) 50 MG tablet Take 1 tablet (50 mg total) by mouth daily. 90 tablet 3  . nitroGLYCERIN (NITROSTAT) 0.4 MG SL tablet Place 1 tablet (0.4 mg total) under the tongue every 5 (five) minutes x 3 doses as needed for chest pain. 20 tablet 12   No current facility-administered medications on file prior to visit.     Allergies  Allergen Reactions  . Crestor [Rosuvastatin Calcium]     Joint pain.  He is able to take a low dose every other day.   . Lipitor [Atorvastatin Calcium]     Joint pain  . Zocor [Simvastatin]     Joint pain    Past Medical History:  Diagnosis Date  . Arthritis   . CAD (coronary artery disease)   . HLD (hyperlipidemia)   . Recurrent chest pain   . Sleep apnea    cpap    Past Surgical History:  Procedure Laterality Date  . COLONOSCOPY WITH PROPOFOL N/A 11/17/2015   Procedure: COLONOSCOPY WITH PROPOFOL;  Surgeon: Lucilla Lame, MD;  Location: [facility];  Service: Endoscopy;  Laterality: N/A;  CPAP  . CORONARY STENT INTERVENTION N/A 08/19/2017   Procedure: CORONARY STENT INTERVENTION;  Surgeon: Nelva Bush, MD;  Location: [facility];  Service: Cardiovascular;  Laterality: N/A;  . INTRAVASCULAR PRESSURE WIRE/FFR STUDY N/A 08/19/2017   Procedure: INTRAVASCULAR PRESSURE WIRE/FFR STUDY;  Surgeon: Nelva Bush, MD;  Location: [facility];  Service: Cardiovascular;  Laterality: N/A;  . LEFT HEART CATH AND CORONARY ANGIOGRAPHY N/A 08/19/2017   Procedure: LEFT HEART CATH AND CORONARY ANGIOGRAPHY;  Surgeon: Nelva Bush, MD;  Location: [facility];  Service: Cardiovascular;  Laterality: N/A;  . NASAL SINUS SURGERY    . POLYPECTOMY N/A 11/17/2015   Procedure: POLYPECTOMY;  Surgeon: Lucilla Lame, MD;  Location: [facility];  Service: Endoscopy;  Laterality: N/A;  sigmoid polyp  . PTCA     3 stents    Social History   Tobacco Use  Smoking Status Former Smoker  . Last attempt to quit: 04/22/2001  . Years  since quitting: 16.4  Smokeless Tobacco Former Systems developer  . Types: Snuff    Social History   Substance and Sexual Activity  Alcohol Use Yes   Comment: 2 beers per day    Family History  Problem Relation Age of Onset  . Hyperlipidemia Mother   . Hypertension Mother   .  Heart disease Mother   . Heart failure Mother        45  . Lung cancer Mother   . Leukemia Father   . Hypertension Sister   . Hypertension Sister   . Coronary artery disease Unknown     Reviw of Systems:  Reviewed in the HPI.  All other systems are negative.   Physical Exam: Blood pressure 134/78, pulse 76, height 5\' 9"  (1.753 m), weight 227 lb 6.4 oz (103.1 kg), SpO2 95 %.  GEN:  Well nourished, well developed in no acute distress HEENT: Normal NECK: No JVD; No carotid bruits LYMPHATICS: No lymphadenopathy CARDIAC: RRR, no chest wall tenderness.  RESPIRATORY:  Clear to auscultation without rales, wheezing or rhonchi  ABDOMEN: Soft, non-tender, non-distended MUSCULOSKELETAL:  No edema; No deformity  SKIN: Warm and dry NEUROLOGIC:  Alert and oriented x 3   ECG:     Assessment / Plan:   1.   CAD :   S/p stenting and Cutting Balloon to the left circumflex marginal system.  He already has 2 layers of stents in that area.     2.  Chest pain :  He is having some unusual pain in his upper chest.  The pain is fairly constant.  It it is not related to exertion.  It does not feel like his regular angina.  I am concerned because he has known moderate to severe coronary artery disease which is not amenable to further stenting.   3.  Peripheral vascular disease:   Some cluadication.   Not limiting   4.  Hyperlipidemia:   - tolerating the Repatha well.  Follow up in lipid clinic     Mertie Moores, MD  10/13/2017 7:46 AM    [address],  [place], [place]  [address] Pager [phone] Phone: [phone]; Fax: [phone]

## 2017-10-15 NOTE — Telephone Encounter (Signed)
Will call when schedule is out

## 2017-10-22 ENCOUNTER — Telehealth: Payer: Self-pay | Admitting: *Deleted

## 2017-10-22 NOTE — Telephone Encounter (Deleted)
-----   Message from Emmaline Life, RN sent at 10/13/2017  2:07 PM EDT ----- Good afternoon,  Would you guys please schedule Dr. Elmarie Shiley patient for fasting lipid/liver/bmet on or around August 1 for Repatha therapy? He lives in [place] and it is much closer for him to go to your office.  Thank you! Sharyn Lull

## 2017-10-22 NOTE — Telephone Encounter (Signed)
Error

## 2017-10-31 NOTE — Telephone Encounter (Signed)
Vm not set up   Will try again at a later time to schedule Fasting labs

## 2017-11-03 NOTE — Telephone Encounter (Signed)
Pt is scheduled for 11/21/17 for labs  Nothing else needed.

## 2017-11-21 ENCOUNTER — Telehealth: Payer: Self-pay | Admitting: Cardiovascular Disease

## 2017-11-21 ENCOUNTER — Other Ambulatory Visit (INDEPENDENT_AMBULATORY_CARE_PROVIDER_SITE_OTHER): Payer: Managed Care, Other (non HMO)

## 2017-11-21 DIAGNOSIS — E7800 Pure hypercholesterolemia, unspecified: Secondary | ICD-10-CM

## 2017-11-21 DIAGNOSIS — I2510 Atherosclerotic heart disease of native coronary artery without angina pectoris: Secondary | ICD-10-CM | POA: Diagnosis not present

## 2017-11-21 DIAGNOSIS — E782 Mixed hyperlipidemia: Secondary | ICD-10-CM

## 2017-11-21 DIAGNOSIS — I10 Essential (primary) hypertension: Secondary | ICD-10-CM

## 2017-11-21 NOTE — Telephone Encounter (Signed)
Barbaraann Rondo sent a picture of his abdomen to Christen Bame, Therapist, sports .  He has a smaller small to moderate sized bruise on his abdomen secondary to the Repatha injection.  He is on Pletal and also is on Plavix.  I reassured him that this is likely to be more of a nuisance than a real health issue. The Repatha injection is an auto inject mechanism so he cannot inject slowly.  He will contiue to monitor      Mertie Moores, MD  11/21/2017 12:07 PM    [address],  [place], [place]  [address] Pager [phone] Phone: [phone]; Fax: [phone]

## 2017-11-22 LAB — HEPATIC FUNCTION PANEL
ALT: 27 IU/L (ref 0–44)
AST: 17 IU/L (ref 0–40)
Albumin: 4.8 g/dL (ref 3.5–5.5)
Alkaline Phosphatase: 60 IU/L (ref 39–117)
BILIRUBIN, DIRECT: 0.18 mg/dL (ref 0.00–0.40)
Bilirubin Total: 0.5 mg/dL (ref 0.0–1.2)
TOTAL PROTEIN: 6.7 g/dL (ref 6.0–8.5)

## 2017-11-22 LAB — BASIC METABOLIC PANEL
BUN/Creatinine Ratio: 8 — ABNORMAL LOW (ref 9–20)
BUN: 9 mg/dL (ref 6–24)
CALCIUM: 9.5 mg/dL (ref 8.7–10.2)
CO2: 20 mmol/L (ref 20–29)
CREATININE: 1.08 mg/dL (ref 0.76–1.27)
Chloride: 106 mmol/L (ref 96–106)
GFR calc Af Amer: 89 mL/min/{1.73_m2} (ref >59)
GFR calc non Af Amer: 77 mL/min/{1.73_m2} (ref >59)
Glucose: 96 mg/dL (ref 65–99)
Potassium: 4.5 mmol/L (ref 3.5–5.2)
Sodium: 142 mmol/L (ref 134–144)

## 2017-11-22 LAB — LIPID PANEL
CHOL/HDL RATIO: 1.7 ratio (ref 0.0–5.0)
Cholesterol, Total: 139 mg/dL (ref 100–199)
HDL: 83 mg/dL (ref >39)
LDL CALC: 41 mg/dL (ref 0–99)
Triglycerides: 74 mg/dL (ref 0–149)
VLDL Cholesterol Cal: 15 mg/dL (ref 5–40)

## 2017-11-26 ENCOUNTER — Telehealth: Payer: Self-pay | Admitting: Cardiovascular Disease

## 2017-11-26 NOTE — Telephone Encounter (Signed)
-----   Message from Thayer Headings, MD sent at 11/24/2017 12:27 PM EDT ----- Labs look great. Continue meds

## 2017-11-26 NOTE — Telephone Encounter (Signed)
New Message ° ° ° °Patient is returning call in reference to lab results. Please call.  °

## 2017-11-26 NOTE — Telephone Encounter (Signed)
Patient made aware of results and recommendations to continue current meds. Patient verbalizes understanding and thanked me for the call.

## 2017-12-05 ENCOUNTER — Ambulatory Visit (HOSPITAL_COMMUNITY)
Admission: RE | Admit: 2017-12-05 | Discharge: 2017-12-05 | Disposition: A | Discharge status: Home / Self Care | Payer: Managed Care, Other (non HMO) | Source: Ambulatory Visit | Attending: Cardiology | Admitting: Cardiology

## 2017-12-05 ENCOUNTER — Telehealth: Payer: Self-pay | Admitting: Nurse Practitioner

## 2017-12-05 ENCOUNTER — Encounter (HOSPITAL_COMMUNITY): Payer: Managed Care, Other (non HMO)

## 2017-12-05 DIAGNOSIS — I6523 Occlusion and stenosis of bilateral carotid arteries: Secondary | ICD-10-CM

## 2017-12-05 MED ORDER — ISOSORBIDE MONONITRATE ER 30 MG PO TB24: 60.0000 mg | ORAL_TABLET | Freq: Every day | ORAL | 11 refills | Status: DC

## 2017-12-05 NOTE — Telephone Encounter (Signed)
I reviewed patient's symptoms with Dr. Saunders Revel. He reviewed the cath films from April and advised that patient should see a provider soon for evaluation of pain. The patient reported a different type of pain to Dr. Acie Fredrickson at last ov in June. He advised that patient may try increasing Imdur to 60 mg daily as long as he is not having low BP and/or dizziness. He advised patient call 911 if pain is present at rest.  I called and reviewed Dr. Darnelle Bos advice with patient. He states he has not monitored BP regularly but that at other visits when it has been checked it has been normal. I advised him to monitor for orthostasis with increased dose of Imdur and reduce dose back to 30 mg if he has low BP, dizziness or light-headedness. I scheduled him to see Dr. Acie Fredrickson on 8/20. He denies pain at rest and agrees to call 911 if that occurs. He will continue to use SL NTG as needed. He verbalized understanding and agreement with plan and thanked me for the call.

## 2017-12-05 NOTE — Telephone Encounter (Signed)
Spoke with patient who complains of episode of "funny feeling in my chest" this morning, relieved by SL NTG. He states he is having these episodes more frequently since last cardiac cath in April. He reports compliance with all cardiac medications. He states he was told by Dr. Acie Fredrickson and Dr. Saunders Revel at the last cardiac cath in April that he may need to be referred to cardiac surgery. I advised that I will discuss with one of Dr. Elmarie Shiley peers and will call him back with advice. Patient verbalized understanding and agreement and thanked me for the call.

## 2017-12-07 NOTE — Telephone Encounter (Signed)
Agree with note by Christen Bame, RN. Will see him on Tuesday

## 2017-12-08 ENCOUNTER — Encounter: Payer: Self-pay | Admitting: *Deleted

## 2017-12-09 ENCOUNTER — Ambulatory Visit (INDEPENDENT_AMBULATORY_CARE_PROVIDER_SITE_OTHER): Payer: Managed Care, Other (non HMO) | Admitting: Cardiovascular Disease

## 2017-12-09 ENCOUNTER — Encounter: Payer: Self-pay | Admitting: Cardiovascular Disease

## 2017-12-09 VITALS — BP 152/88 | HR 71 | Ht 69.0 in | Wt 232.2 lb

## 2017-12-09 DIAGNOSIS — I25110 Atherosclerotic heart disease of native coronary artery with unstable angina pectoris: Secondary | ICD-10-CM

## 2017-12-09 MED ORDER — ISOSORBIDE MONONITRATE ER 30 MG PO TB24: 30.0000 mg | ORAL_TABLET | Freq: Every day | ORAL | 0 refills | Status: DC

## 2017-12-09 MED ORDER — ISOSORBIDE MONONITRATE ER 30 MG PO TB24: 30.0000 mg | ORAL_TABLET | Freq: Every day | ORAL | 3 refills | Status: DC

## 2017-12-09 MED ORDER — LOSARTAN POTASSIUM 100 MG PO TABS: 100.0000 mg | ORAL_TABLET | Freq: Every day | ORAL | 3 refills | Status: DC

## 2017-12-09 NOTE — Progress Notes (Signed)
Mario [name] Date of Birth  30-Jul-1960       [name]     [address] [place]   [name], [name]  [address]    [phone]      Fax  [phone]      Problem List: 1.  Coronary artery disease-status post PTCA and stenting in 2004 2.  Hyperlipidemia 3. Hypertension     Mario [name] is a 57 yo with hx of CAD.,  He's had an upper respiratory tract viral infection for the past month or so.  He denies any chest pain.  He has been able to do of his normal activities without any significant problems. He works at the [employer] in [place].  Mario [name]. 26, 2015: Mario [name] is doing well.  No angina.   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   BP has been elevated .  He has been tried on HCTZ in the past but it made him feel poorly.    August 17, 2013:  November 16, 2013: Bodies doing well from a cardiac standpoint. His medical doctor recently changed his Nexium to products because of the interaction and potential in addition of Plavix metabolism. He's noted that he's had lots more indigestion and heartburn since stopping the Nexium.  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 We discussed checking a P2 Y  12 assay with him on Plavix and Nexium to prove that he is still inhibited.  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 He notes that he bleeds quite readily if he cuts himself.  November 06, 2015: Mario [name] is seen after a 2 year absence. He has a history of coronary artery disease and hypertension BP has been elevated  Still eats some salty foods.  No CP,  Still very active   July 01, 2016:  Mario [name] has been having some problems with elevated blood pressure recently.   He had a question of some mental status changes March  1. .? TIA symptoms .  Slurring works, [name]  ,   BP was better  170/100. No further confusion .    Has gained 20 lbs over the winter.   Not getting any exercise.   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 10, 2018:  Mario [name] is seen today . Having right calf claudication  ABIs look great.  Dr. Lucky Cowboy wants to do a PV angiogram  Mario [name] wants a 2nd opinion.   Sep 02, 2017:  Right is seen back today for follow-up visit.  Seen with his wife ,  Mario [name] .    Since I last saw him he has had coronary stenting to the proximal left circumflex artery and obtuse marginal artery.  He is started back smoking again unfortunately. Has now stopped smoking as of this month. Trying to exercise ,  Walking some  Says his claudication is better  Lower extremity arterial duplex scan reveals a right 75 to 99% stenosis in the distal SFA Left ankle-brachial indexes are normal.  Was walking recently.  Has some dizziness after walking 1 miles   LDL was 86.    Chol = 185  October 13, 2017:  Having some right sided neck , upper chest pain , bilateral upper chest pain Not  Exertional,  [name], dipping, [name], [name]    Current Outpatient Medications on File Prior to Visit  Medication Sig Dispense Refill  . aspirin 81 MG  tablet Take 81 mg daily by mouth.     . cilostazol (PLETAL) 100 MG tablet Take 1 tablet by mouth daily.    . clopidogrel (PLAVIX) 75 MG tablet Take 1 tablet (75 mg total) by mouth daily. 90 tablet 3  . Evolocumab (REPATHA SURECLICK) 161 MG/ML SOAJ Inject 140 mg into the skin every 14 (fourteen) days. 6 pen 3  . ezetimibe (ZETIA) 10 MG tablet Take 1 tablet (10 mg total) by mouth daily. 90 tablet 3  . isosorbide mononitrate (IMDUR) 30 MG 24 hr tablet Take 2 tablets (60 mg total) by mouth daily. 60 tablet 11  . losartan (COZAAR) 50 MG tablet Take 1 tablet (50 mg total) by mouth daily. 90 tablet 3  . nitroGLYCERIN (NITROSTAT) 0.4 MG SL tablet Place 1 tablet (0.4 mg total) under the tongue every 5 (five) minutes x 3 doses as needed for chest pain. 25 tablet 6   No current facility-administered medications on file prior to visit.     Allergies  Allergen Reactions  . Crestor [Rosuvastatin Calcium]     Joint pain.  He is able to take a low dose every other day.   . Lipitor [Atorvastatin Calcium]     Joint pain  . Zocor [Simvastatin]     Joint pain    Past Medical History:  Diagnosis Date  . Arthritis   . CAD (coronary artery disease)   . HLD (hyperlipidemia)   . Recurrent chest pain   . Sleep apnea    cpap    Past Surgical History:  Procedure Laterality Date  . COLONOSCOPY WITH PROPOFOL N/A 11/17/2015   Procedure: COLONOSCOPY WITH PROPOFOL;  Surgeon: Lucilla Lame, MD;  Location: [facility];  Service: Endoscopy;  Laterality: N/A;  CPAP  . CORONARY STENT INTERVENTION N/A 08/19/2017   Procedure: CORONARY STENT INTERVENTION;  Surgeon: Nelva Bush, MD;  Location: [facility];  Service: Cardiovascular;  Laterality: N/A;  . INTRAVASCULAR PRESSURE WIRE/FFR STUDY N/A 08/19/2017   Procedure: INTRAVASCULAR PRESSURE WIRE/FFR STUDY;  Surgeon: Nelva Bush, MD;  Location: [facility];  Service: Cardiovascular;  Laterality: N/A;  . LEFT HEART CATH AND CORONARY  ANGIOGRAPHY N/A 08/19/2017   Procedure: LEFT HEART CATH AND CORONARY ANGIOGRAPHY;  Surgeon: Nelva Bush, MD;  Location: [facility];  Service: Cardiovascular;  Laterality: N/A;  . NASAL SINUS SURGERY    . POLYPECTOMY N/A 11/17/2015   Procedure: POLYPECTOMY;  Surgeon: Lucilla Lame, MD;  Location: [facility];  Service: Endoscopy;  Laterality: N/A;  sigmoid polyp  . PTCA     3 stents    Social History   Tobacco Use  Smoking Status Former Smoker  . Last attempt to quit: 04/22/2001  . Years since quitting: 16.6  Smokeless Tobacco Former Systems developer  . Types: Snuff    Social History   Substance and Sexual Activity  Alcohol Use Yes   Comment: 2 beers per day    Family History  Problem Relation Age of Onset  . Hyperlipidemia Mother   . Hypertension Mother   . Heart disease Mother   . Heart failure Mother        46  . Lung cancer Mother   . Leukemia Father   . Hypertension Sister   . Hypertension Sister   . Coronary artery disease Unknown     Reviw of Systems:  Reviewed in the HPI.  All other systems are negative.   Physical Exam: Blood pressure (!) 152/88, pulse 71, height 5\' 9"  (1.753 m), weight 232 lb 3.2 oz (105.3 kg), SpO2 97 %.  GEN:  Well nourished, well developed in no acute distress HEENT: Normal NECK: No JVD; No carotid bruits LYMPHATICS: No lymphadenopathy CARDIAC: RRR  RESPIRATORY:  Clear to auscultation without rales, wheezing or rhonchi  ABDOMEN: Soft, non-tender, non-distended MUSCULOSKELETAL:  No edema; No deformity  SKIN: Warm and dry NEUROLOGIC:  Alert and oriented x 3   ECG:     Aug. 20, 2019:  NSR at 74.     Assessment / Plan:   1.   CAD :   S/p stenting and [place] to the left circumflex marginal system.  He already has 2 layers of stents in that area.    Is having symptoms that are consistent with angina but it should be noted that his blood pressure is high and somehow isosorbide is not medication that he thinks he is  taking.  We will restart him on isosorbide 30 mg a day and will increase the losartan to 100 mg a day to help improve his blood pressure.  I will see him in 3 months but I will see him sooner if needed.  He will check in with [place] in about 2 to 3 weeks to let us know if this is improving.  I have encouraged him to go ahead and use the nitroglycerin as much as he needs.  Gust the option of repeating his heart catheter station.  At this time he only has left circumflex disease and really does not have significant disease in his left anterior descending artery or right coronary artery.  I would prefer to continue with medical therapy as long as we can.   3.  Peripheral vascular disease:   Not had any symptoms of claudication.  4.  Hyperlipidemia:   -Had a great response to the [place].  Continue current medications.     Mertie Moores, MD  12/09/2017 2:40 PM    [facility] [place],  [address], [place]  [address] Pager [phone] Phone: [phone]; Fax: [phone]

## 2017-12-09 NOTE — Patient Instructions (Signed)
Medication Instructions:  Your physician has recommended you make the following change in your medication:   RESTART Imdur (Isosorbide mononitrate) 30 mg once daily INCREASE Losartan to 100 mg once daily   Labwork: None Ordered   Testing/Procedures: None Ordered   Follow-Up: Call Sharyn Lull, RN in 1-2 weeks to let us know if symptoms have improved  Your physician recommends that you schedule a follow-up appointment in:  3 months with Dr. Acie Fredrickson   If you need a refill on your cardiac medications before your next appointment, please call your pharmacy.   Thank you for choosing [facility]! Christen Bame, RN [phone]

## 2017-12-23 ENCOUNTER — Telehealth: Payer: Self-pay | Admitting: Cardiovascular Disease

## 2017-12-23 NOTE — Telephone Encounter (Signed)
New message: ° ° ° ° ° ° °Pt is returning a call °

## 2017-12-23 NOTE — Telephone Encounter (Signed)
Pt states he is returning a call back from our office today.  Pt states his VM is full, so only our number came across his phone.  Unable to find any documentation showing that anyone from our office called the pt today.  Pt has already received all test results.  He is aware of his upcoming appts with Dr Acie Fredrickson.  Endorsed to the pt that at this time, we are unsure who called him this morning, being there is not notation in the computer system.  Pt states he is having no problems, and has not tried calling our office for any assistance.  Pt states that this could have been an old call missed. Informed the pt to call us back as needed, and apologies provided if our office did try to call him for any reason.  Pt verbalized understanding and gracious for the follow-up.

## 2018-01-21 ENCOUNTER — Encounter: Payer: Self-pay | Admitting: Cardiovascular Disease

## 2018-01-21 ENCOUNTER — Ambulatory Visit: Payer: Managed Care, Other (non HMO) | Admitting: Cardiovascular Disease

## 2018-01-21 VITALS — BP 130/82 | HR 78 | Ht 69.0 in | Wt 236.0 lb

## 2018-01-21 DIAGNOSIS — I739 Peripheral vascular disease, unspecified: Secondary | ICD-10-CM

## 2018-01-21 DIAGNOSIS — I2510 Atherosclerotic heart disease of native coronary artery without angina pectoris: Secondary | ICD-10-CM | POA: Diagnosis not present

## 2018-01-21 DIAGNOSIS — E782 Mixed hyperlipidemia: Secondary | ICD-10-CM | POA: Diagnosis not present

## 2018-01-21 NOTE — Progress Notes (Signed)
[name] Date of Birth  30-Jul-1960       [name]     [address] [place]   [name], [name]  [address]    [phone]      Fax  [phone]      Problem List: 1.  Coronary artery disease-status post PTCA and stenting in 2004 2.  Hyperlipidemia 3. Hypertension     [name] is a 57 yo with hx of CAD.,  He's had an upper respiratory tract viral infection for the past month or so.  He denies any chest pain.  He has been able to do of his normal activities without any significant problems. He works at the [employer] in [place].  [name]. 26, 2015: [name] is doing well.  No angina.   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   BP has been elevated .  He has been tried on HCTZ in the past but it made him feel poorly.    August 17, 2013:  November 16, 2013: Bodies doing well from a cardiac standpoint. His medical doctor recently changed his Nexium to products because of the interaction and potential in addition of Plavix metabolism. He's noted that he's had lots more indigestion and heartburn since stopping the Nexium.  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 We discussed checking a P2 Y  12 assay with him on Plavix and Nexium to prove that he is still inhibited.  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 He notes that he bleeds quite readily if he cuts himself.  November 06, 2015: [name] is seen after a 2 year absence. He has a history of coronary artery disease and hypertension BP has been elevated  Still eats some salty foods.  No CP,  Still very active   July 01, 2016:  [name] has been having some problems with elevated blood pressure recently.   He had a question of some mental status changes March  1. .? TIA symptoms .  Slurring works, [name]  ,   BP was better  170/100. No further confusion .    Has gained 20 lbs over the winter.   Not getting any exercise.   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, 2018:  [name] is seen today . Having right calf claudication  ABIs look great.  Dr. Lucky Cowboy wants to do a PV angiogram  Khale wants a 2nd opinion.   Sep 02, 2017:  Right is seen back today for follow-up visit.  Seen with his wife ,  [name] ,  Walking some  Says his claudication is better  Lower extremity arterial duplex scan reveals a right 75 to 99% stenosis in the distal SFA Left ankle-brachial indexes are normal.  Was walking recently.  Has some dizziness after walking 1 miles   LDL was 86.    Chol = 185  October 13, 2017:  Having some right sided neck , upper chest pain , bilateral upper chest pain Not  Exertional,  [name], dipping, [name], [name]   January 21, 2018:  Brendon is seen today for follow-up visit. Tries to walk daily . Still not smoking  Has  separated from his wife [name].  Does not watch his carbs   Current Outpatient Medications on File Prior to Visit  Medication Sig Dispense Refill  . aspirin 81 MG tablet Take 81 mg daily by mouth.     . cilostazol (PLETAL) 100 MG tablet Take 1 tablet by mouth daily.    . clopidogrel (PLAVIX) 75 MG tablet Take 1 tablet (75 mg total) by mouth daily. 90 tablet 3  . Evolocumab (REPATHA SURECLICK) 950 MG/ML SOAJ Inject 140 mg into the skin every 14 (fourteen) days. 6 pen 3  . ezetimibe (ZETIA) 10 MG tablet Take 1 tablet (10 mg total) by mouth daily. 90 tablet 3  . isosorbide mononitrate (IMDUR) 30 MG 24 hr tablet Take 1 tablet (30 mg total) by mouth daily. 90 tablet 3  . isosorbide mononitrate (IMDUR) 30 MG 24 hr tablet Take 1 tablet (30 mg total) by mouth daily. Short term Rx 30 tablet 0  . nitroGLYCERIN (NITROSTAT) 0.4 MG SL tablet Place 1 tablet (0.4 mg total) under the tongue every 5 (five) minutes x 3 doses as needed for chest pain. 25 tablet 6   No current facility-administered medications on file prior to visit.     Allergies  Allergen Reactions  . Crestor [Rosuvastatin Calcium]     Joint pain.  He is able to take a low dose every other day.   . Lipitor [Atorvastatin Calcium]     Joint pain  . Zocor [Simvastatin]     Joint pain    Past Medical History:  Diagnosis Date  . Arthritis   . CAD (coronary artery disease)   . HLD (hyperlipidemia)   . Recurrent chest pain   . Sleep apnea    cpap    Past Surgical History:  Procedure Laterality Date  . COLONOSCOPY WITH PROPOFOL N/A 11/17/2015   Procedure: COLONOSCOPY WITH PROPOFOL;  Surgeon: Lucilla Lame, MD;  Location: [facility];  Service: Endoscopy;  Laterality: N/A;  CPAP  . CORONARY STENT INTERVENTION N/A 08/19/2017   Procedure: CORONARY STENT INTERVENTION;  Surgeon: Nelva Bush, MD;  Location: [facility];  Service: Cardiovascular;  Laterality: N/A;  . INTRAVASCULAR PRESSURE WIRE/FFR STUDY N/A 08/19/2017    Procedure: INTRAVASCULAR PRESSURE WIRE/FFR STUDY;  Surgeon: Nelva Bush, MD;  Location: [facility];  Service: Cardiovascular;  Laterality: N/A;  . LEFT HEART CATH AND CORONARY ANGIOGRAPHY N/A 08/19/2017   Procedure:  LEFT HEART CATH AND CORONARY ANGIOGRAPHY;  Surgeon: Nelva Bush, MD;  Location: [facility];  Service: Cardiovascular;  Laterality: N/A;  . NASAL SINUS SURGERY    . POLYPECTOMY N/A 11/17/2015   Procedure: POLYPECTOMY;  Surgeon: Lucilla Lame, MD;  Location: [facility];  Service: Endoscopy;  Laterality: N/A;  sigmoid polyp  . PTCA     3 stents    Social History   Tobacco Use  Smoking Status Former Smoker  . Last attempt to quit: 04/22/2001  . Years since quitting: 16.7  Smokeless Tobacco Former Systems developer  . Types: Snuff    Social History   Substance and Sexual Activity  Alcohol Use Yes   Comment: 2 beers per day    Family History  Problem Relation Age of Onset  . Hyperlipidemia Mother   . Hypertension Mother   . Heart disease Mother   . Heart failure Mother        73  . Lung cancer Mother   . Leukemia Father   . Hypertension Sister   . Hypertension Sister   . Coronary artery disease Unknown     Reviw of Systems:  Reviewed in the HPI.  All other systems are negative.  Physical Exam: Blood pressure 130/82, pulse 78, height 5\' 9"  (1.753 m), weight 236 lb (107 kg), SpO2 96 %.  GEN:  Well nourished, well developed in no acute distress HEENT: Normal NECK: No JVD; No carotid bruits LYMPHATICS: No lymphadenopathy CARDIAC: RRR  RESPIRATORY:  Clear to auscultation without rales, wheezing or rhonchi  ABDOMEN: Soft, non-tender, non-distended MUSCULOSKELETAL:  No edema; No deformity  SKIN: Warm and dry NEUROLOGIC:  Alert and oriented x 3  ECG:        Assessment / Plan:   1.   CAD :   Angina is better.       3.  Peripheral vascular disease:    Still has some claudication when walking,    4.  Hyperlipidemia:   -  On Repatha.    Labs in Aug look great. Recheck labs in 6 months     Mertie Moores, MD  01/21/2018 8:18 AM    [facility] [place],  [place], [place]  [address] Pager [phone] Phone: [phone]; Fax: [phone]

## 2018-01-21 NOTE — Patient Instructions (Signed)

## 2018-01-27 ENCOUNTER — Other Ambulatory Visit: Payer: Self-pay | Admitting: Nurse Practitioner

## 2018-01-27 MED ORDER — EVOLOCUMAB 140 MG/ML ~~LOC~~ SOAJ: 140.0000 mg | SUBCUTANEOUS | 3 refills | Status: DC

## 2018-01-27 NOTE — Progress Notes (Signed)
Received call from patient who states Repatha Rx is no longer being filled by [name] and he needs Rx to go to [facility]

## 2018-02-04 ENCOUNTER — Other Ambulatory Visit: Payer: Self-pay | Admitting: *Deleted

## 2018-02-04 MED ORDER — CILOSTAZOL 100 MG PO TABS: 100.0000 mg | ORAL_TABLET | Freq: Every day | ORAL | 3 refills | Status: DC

## 2018-02-05 ENCOUNTER — Telehealth: Payer: Self-pay | Admitting: Pharmacist

## 2018-02-05 NOTE — Telephone Encounter (Signed)
Prior [name] is on file and approved through 04/21/18.   [place] about needing prior auth - Spoke with technician who states that the insurance is still saying PA required. She tried running for a different NDC and it went through the insurance.   They will notify the patient.

## 2018-03-11 ENCOUNTER — Ambulatory Visit: Payer: Managed Care, Other (non HMO) | Admitting: Cardiovascular Disease

## 2018-06-08 NOTE — Patient Instructions (Addendum)
Please see your primary care provider for a yearly physical and labs.  I will have the office call you on your glucose and cholesterol results when they return if you have not heard within 1 week please call the office and will have you follow up with your primary care doctor for any abnormal's. This biometric physical is not a substitute for seeing a primary care provider for a physical. Provider also recommends if you do not have a primary care provider for patient see a  primary care physician/ provider  for a routine physical and to establish primary care. Patient may chose provider of choice. Also gave the [place] at [phone] or web site at [email or web address] to help assist with finding a primary care doctor. Patient understands this office is acute care and no primary care is in this office.  Keep scheduled follow ups with your specialists as well.    Health Maintenance, Male A healthy lifestyle and preventive care is important for your health and wellness. Ask your health care provider about what schedule of regular examinations is right for you. What should I know about weight and diet? Eat a Healthy Diet  Eat plenty of vegetables, fruits, whole grains, low-fat dairy products, and lean protein.  Do not eat a lot of foods high in solid fats, added sugars, or salt.  Maintain a Healthy Weight Regular exercise can help you achieve or maintain a healthy weight. You should:  Do at least 150 minutes of exercise each week. The exercise should increase your heart rate and make you sweat (moderate-intensity exercise).  Do strength-training exercises at least twice a week. Watch Your Levels of Cholesterol and Blood Lipids  Have your blood tested for lipids and cholesterol every 5 years starting at 56 years of age. If you are at high risk for heart disease, you should start having your blood tested when you are 56 years old. You may need to have your  cholesterol levels checked more often if: ? Your lipid or cholesterol levels are high. ? You are older than 56 years of age. ? You are at high risk for heart disease. What should I know about cancer screening? Many types of cancers can be detected early and may often be prevented. Lung Cancer  You should be screened every year for lung cancer if: ? You are a current smoker who has smoked for at least 30 years. ? You are a former smoker who has quit within the past 15 years.  Talk to your health care provider about your screening options, when you should start screening, and how often you should be screened. Colorectal Cancer  Routine colorectal cancer screening usually begins at 56 years of age and should be repeated every 5-10 years until you are 56 years old. You may need to be screened more often if early forms of precancerous polyps or small growths are found. Your health care provider may recommend screening at an earlier age if you have risk factors for colon cancer.  Your health care provider may recommend using home test kits to check for hidden blood in the stool.  A small camera at the end of a tube can be used to examine your colon (sigmoidoscopy or colonoscopy). This checks for the earliest forms of colorectal cancer. Prostate and Testicular Cancer  Depending on your age and overall health, your health care provider may do certain tests to screen for prostate and testicular cancer.  Talk  to your health care provider about any symptoms or concerns you have about testicular or prostate cancer. Skin Cancer  Check your skin from head to toe regularly.  Tell your health care provider about any new moles or changes in moles, especially if: ? There is a change in a mole's size, shape, or color. ? You have a mole that is larger than a pencil eraser.  Always use sunscreen. Apply sunscreen liberally and repeat throughout the day.  Protect yourself by wearing long sleeves, pants,  a wide-brimmed hat, and sunglasses when outside. What should I know about heart disease, diabetes, and high blood pressure?  If you are 17-44 years of age, have your blood pressure checked every 3-5 years. If you are 45 years of age or older, have your blood pressure checked every year. You should have your blood pressure measured twice-once when you are at a hospital or clinic, and once when you are not at a hospital or clinic. Record the average of the two measurements. To check your blood pressure when you are not at a hospital or clinic, you can use: ? An automated blood pressure machine at a pharmacy. ? A home blood pressure monitor.  Talk to your health care provider about your target blood pressure.  If you are between 8-17 years old, ask your health care provider if you should take aspirin to prevent heart disease.  Have regular diabetes screenings by checking your fasting blood sugar level. ? If you are at a normal weight and have a low risk for diabetes, have this test once every three years after the age of 85. ? If you are overweight and have a high risk for diabetes, consider being tested at a younger age or more often.  A one-time screening for abdominal aortic aneurysm (AAA) by ultrasound is recommended for men aged 42-75 years who are current or former smokers. What should I know about preventing infection? Hepatitis B If you have a higher risk for hepatitis B, you should be screened for this virus. Talk with your health care provider to find out if you are at risk for hepatitis B infection. Hepatitis C Blood testing is recommended for:  Everyone born from [birth year] through 1963.  Anyone with known risk factors for hepatitis C. Sexually Transmitted Diseases (STDs)  You should be screened each year for STDs including gonorrhea and chlamydia if: ? You are sexually active and are younger than 56 years of age. ? You are older than 56 years of age and your health care provider  tells you that you are at risk for this type of infection. ? Your sexual activity has changed since you were last screened and you are at an increased risk for chlamydia or gonorrhea. Ask your health care provider if you are at risk.  Talk with your health care provider about whether you are at high risk of being infected with HIV. Your health care provider may recommend a prescription medicine to help prevent HIV infection. What else can I do?  Schedule regular health, dental, and eye exams.  Stay current with your vaccines (immunizations).  Do not use any tobacco products, such as cigarettes, chewing tobacco, and e-cigarettes. If you need help quitting, ask your health care provider.  Limit alcohol intake to no more than 2 drinks per day. One drink equals 12 ounces of beer, 5 ounces of wine, or 1 ounces of hard liquor.  Do not use street drugs.  Do not share needles.  Ask  your health care provider for help if you need support or information about quitting drugs.  Tell your health care provider if you often feel depressed.  Tell your health care provider if you have ever been abused or do not feel safe at home. This information is not intended to replace advice given to you by your health care provider. Make sure you discuss any questions you have with your health care provider. Document Released: 10/05/2007 Document Revised: 12/06/2015 Document Reviewed: 01/10/2015 Elsevier Interactive Patient Education  2019 [place].   BMI for Adults  Body mass index (BMI) is a number that is calculated from a person's weight and height. BMI may help to estimate how much of a person's weight is composed of fat. BMI can help identify those who may be at higher risk for certain medical problems. How is BMI used with adults? BMI is used as a screening tool to identify possible weight problems. It is used to check whether a person is obese, overweight, healthy weight, or underweight. How is BMI  calculated? BMI measures your weight and compares it to your height. This can be done either in [place] ([place] or metric measurements. Note that charts are available to help you find your BMI quickly and easily without having to do these calculations yourself. To calculate your BMI in English (U.S.) measurements, your health care provider will: 1. Measure your weight in pounds (lb). 2. Multiply the number of pounds by 703. ? For example, for a person who weighs 180 lb, multiply that number by 703, which equals 126,540. 3. Measure your height in inches (in). Then multiply that number by itself to get a measurement called "inches squared." ? For example, for a person who is 70 in tall, the "inches squared" measurement is 70 in x 70 in, which equals 4900 inches squared. 4. Divide the total from Step 2 (number of lb x 703) by the total from Step 3 (inches squared): 126,540  4900 = 25.8. This is your BMI. To calculate your BMI in metric measurements, your health care provider will: 1. Measure your weight in kilograms (kg). 2. Measure your height in meters (m). Then multiply that number by itself to get a measurement called "meters squared." ? For example, for a person who is 1.75 m tall, the "meters squared" measurement is 1.75 m x 1.75 m, which is equal to 3.1 meters squared. 3. Divide the number of kilograms (your weight) by the meters squared number. In this example: 70  3.1 = 22.6. This is your BMI. How is BMI interpreted? To interpret your results, your health care provider will use BMI charts to identify whether you are underweight, normal weight, overweight, or obese. The following guidelines will be used:  Underweight: BMI less than 18.5.  Normal weight: BMI between 18.5 and 24.9.  Overweight: BMI between 25 and 29.9.  Obese: BMI of 30 and above. Please note:  Weight includes both fat and muscle, so someone with a muscular build, such as an athlete, may have a BMI that is higher than  24.9. In cases like these, BMI is not an accurate measure of body fat.  To determine if excess body fat is the cause of a BMI of 25 or higher, further assessments may need to be done by a health care provider.  BMI is usually interpreted in the same way for men and women. Why is BMI a useful tool? BMI is useful in two ways:  Identifying a weight problem that may  be related to a medical condition, or that may increase the risk for medical problems.  Promoting lifestyle and diet changes in order to reach a healthy weight. Summary  Body mass index (BMI) is a number that is calculated from a person's weight and height.  BMI may help to estimate how much of a person's weight is composed of fat. BMI can help identify those who may be at higher risk for certain medical problems.  BMI can be measured using English measurements or metric measurements.  To interpret your results, your health care provider will use BMI charts to identify whether you are underweight, normal weight, overweight, or obese. This information is not intended to replace advice given to you by your health care provider. Make sure you discuss any questions you have with your health care provider. Document Released: 12/19/2003 Document Revised: 02/19/2017 Document Reviewed: 02/19/2017 Elsevier Interactive Patient Education  2019 [employer].

## 2018-06-08 NOTE — Progress Notes (Signed)
[facility]  Subjective:     Patient ID: Mario [name], male   DOB: 12-09-1960, 58 y.o.   MRN: [id number]  Blood pressure 132/84, pulse 72, temperature 98.2 F (36.8 C), temperature source Oral, resp. rate 16, height 5\' 9"  (1.753 m), weight 238 lb (108 kg), SpO2 97 %.  Body mass index is 35.15 kg/m.   HPI  Patient is a 58 year old male in no acute distress who comes to the clinic for a biometric screening with brief biometric screening with labs include fasting glucose and lipids.   Seen cardiology last 01/21/18 extensive cardiac history -on Repatha was discontinued by insurance he reports. He is followed by [facility] and sees him regularly.  Problem List: 1.  Coronary artery disease-status post PTCA and stenting in 2004 2.  Hyperlipidemia 3. Hypertension He has been able to do of his normal activities without any significant problems. Denies Angina.  [name], MD is patients primary care provider.   He is walking every day.  He quit smoking over one year ago and chewing tobacco and reports he has gained 5 lbs since last year.   He denies any complaints or concerns.   Patient  denies any fever, body aches,chills, rash, chest pain, shortness of breath, nausea, vomiting, or diarrhea.     Allergies  Allergen Reactions  . Crestor [Rosuvastatin Calcium]     Joint pain.  He is able to take a low dose every other day.   . Lipitor [Atorvastatin Calcium]     Joint pain  . Zocor [Simvastatin]     Joint pain    Patient Active Problem List   Diagnosis Date Noted  . Chest pain 08/19/2017  . Unstable angina ([place])   . 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 Primary osteoarthritis of right elbow 11/20/2016  . Polyarthralgia 11/06/2016  . Hypertension 11/06/2016  . Arthritis of finger of both hands 10/30/2016  . Extremity atherosclerosis with intermittent claudication ([place]) 10/30/2016  . TIA (transient  ischemic attack) 07/03/2016  . Arthritis 01/29/2016  . Onychomycosis 11/20/2015  . Special screening for malignant neoplasms, colon   . Benign neoplasm of sigmoid colon   . Hyperlipidemia 11/06/2015  . Sleep apnea 10/09/2015  . HTN (hypertension) 04/27/2012  . CAD (coronary artery disease) 04/27/2012  . 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  aspirin 81 MG tablet, Take 81 mg daily by mouth. , Disp: , Rfl:  .  cilostazol (PLETAL) 100 MG tablet, Take 1 tablet (100 mg total) by mouth daily., Disp: 30 tablet, Rfl: 3 .  clopidogrel (PLAVIX) 75 MG tablet, Take 1 tablet (75 mg total) by mouth daily., Disp: 90 tablet, Rfl: 3 .  Evolocumab (REPATHA SURECLICK) 182 MG/ML SOAJ, Inject 140 mg into the skin every 14 (fourteen) days., Disp: 6 pen, Rfl: 3 .  ezetimibe (ZETIA) 10 MG tablet, Take 1 tablet (10 mg total) by mouth daily., Disp: 90 tablet, Rfl: 3 .  isosorbide mononitrate (IMDUR) 30 MG 24 hr tablet, Take 1 tablet (30 mg total) by mouth daily., Disp: 90 tablet, Rfl: 3 .  isosorbide mononitrate (IMDUR) 30 MG 24 hr tablet, Take 1 tablet (30 mg total) by mouth daily. Short term Rx, Disp: 30 tablet, Rfl: 0 .  nitroGLYCERIN (NITROSTAT) 0.4 MG SL tablet, Place 1 tablet (0.4 mg total) under the tongue every 5 (five)  minutes x 3 doses as needed for chest pain., Disp: 25 tablet, Rfl: 6  Review of Systems  Constitutional: Negative.   HENT: Negative.   Eyes: Negative.   Respiratory: Negative.   Cardiovascular: Negative.   Gastrointestinal: Negative.   Endocrine: Negative for polydipsia, polyphagia and polyuria.  Genitourinary: Negative.   Musculoskeletal: Negative.   Skin: Negative.   Allergic/Immunologic: Negative.   Neurological: Negative.   Hematological: Negative.   Psychiatric/Behavioral: Negative.        Objective:   Physical Exam Vitals signs reviewed.  Constitutional:       General: He is not in acute distress.    Appearance: Normal appearance. He is obese. He is not ill-appearing, toxic-appearing or diaphoretic.  HENT:     Head: Normocephalic and atraumatic.     Right Ear: Tympanic membrane, ear canal and external ear normal. There is no impacted cerumen.     Left Ear: Tympanic membrane, ear canal and external ear normal. There is no impacted cerumen.     Nose: Nose normal. No congestion or rhinorrhea.     Mouth/Throat:     Mouth: Mucous membranes are moist.     Pharynx: Oropharynx is clear. No oropharyngeal exudate or posterior oropharyngeal erythema.  Eyes:     General: No scleral icterus.       Right eye: No discharge.        Left eye: No discharge.     Extraocular Movements: Extraocular movements intact.     Conjunctiva/sclera: Conjunctivae normal.     Pupils: Pupils are equal, round, and reactive to light.  Neck:     Musculoskeletal: Normal range of motion and neck supple. No neck rigidity or muscular tenderness.     Vascular: No carotid bruit.  Cardiovascular:     Rate and Rhythm: Normal rate and regular rhythm.     Pulses: Normal pulses.     Heart sounds: Normal heart sounds. No murmur. No friction rub. No gallop.   Pulmonary:     Effort: Pulmonary effort is normal. No respiratory distress.     Breath sounds: Normal breath sounds. No stridor. No wheezing, rhonchi or rales.  Chest:     Chest wall: No tenderness.  Abdominal:     Palpations: Abdomen is soft.  Musculoskeletal: Normal range of motion.  Lymphadenopathy:     Cervical: No cervical adenopathy.  Skin:    General: Skin is warm and dry.     Capillary Refill: Capillary refill takes less than 2 seconds.  Neurological:     General: No focal deficit present.     Mental Status: He is alert and oriented to person, place, and time.     Gait: Gait normal.  Psychiatric:        Mood and Affect: Mood normal.        Behavior: Behavior normal.        Thought Content: Thought content normal.         Judgment: Judgment normal.        Assessment:     Encounter for biometric screening - Plan: Lipid panel, Glucose, CANCELED: Lipid panel, CANCELED: Glucose, CANCELED: Glucose, CANCELED: Lipid panel, CANCELED: Lipid panel, CANCELED: Glucose      Plan:      Gave and reviewed After Visit Summary(AVS) with patient. Patient is advised to read the after visit summary as well and let the provider know if any question, concerns or clarifications are needed.   Please see your primary care provider for a yearly physical and  labs.  I will have the office call you on your glucose and cholesterol results when they return if you have not heard within 1 week please call the office and will have you follow up with your primary care doctor for any abnormal's. This biometric physical is not a substitute for seeing a primary care provider for a physical. Provider also recommends if you do not have a primary care provider for patient see a  primary care physician/ provider  for a routine physical and to establish primary care. Patient may chose provider of choice. Also gave the [place] at [phone] or web site at [email or web address] to help assist with finding a primary care doctor. Patient understands this office is acute care and no primary care is in this office.    Provider discussed with patient that this clinic is not a substitute for regular primary care, patient should see primary care for male health maintenance in labs that are needed yearly, as well as follow-up with primary care for any chronic problems.  Discussed importance of seeing specialist regularly as recommended by primary care and specialty services.   Follow up with primary care as needed for chronic and maintenance health care- can be seen in this employee clinic for acute care. As well as regular follow up as directed by your specialist.

## 2018-06-09 ENCOUNTER — Ambulatory Visit: Payer: Managed Care, Other (non HMO) | Admitting: Adult Health

## 2018-06-09 ENCOUNTER — Other Ambulatory Visit: Payer: Self-pay | Admitting: Nurse Practitioner

## 2018-06-09 VITALS — BP 132/84 | HR 72 | Temp 98.2°F | Resp 16 | Ht 69.0 in | Wt 238.0 lb

## 2018-06-09 DIAGNOSIS — Z0189 Encounter for other specified special examinations: Principal | ICD-10-CM

## 2018-06-09 DIAGNOSIS — Z008 Encounter for other general examination: Secondary | ICD-10-CM

## 2018-06-09 MED ORDER — CLOPIDOGREL BISULFATE 75 MG PO TABS: 75.0000 mg | ORAL_TABLET | Freq: Every day | ORAL | 0 refills | Status: DC

## 2018-06-09 MED ORDER — CLOPIDOGREL BISULFATE 75 MG PO TABS: 75.0000 mg | ORAL_TABLET | Freq: Every day | ORAL | 3 refills | Status: DC

## 2018-06-10 ENCOUNTER — Telehealth: Payer: Self-pay | Admitting: Pharmacist

## 2018-06-10 ENCOUNTER — Telehealth: Payer: Self-pay | Admitting: Cardiovascular Disease

## 2018-06-10 LAB — LIPID PANEL
Chol/HDL Ratio: 3.1 ratio (ref 0.0–5.0)
Cholesterol, Total: 197 mg/dL (ref 100–199)
HDL: 63 mg/dL (ref >39)
LDL Calculated: 119 mg/dL — ABNORMAL HIGH (ref 0–99)
TRIGLYCERIDES: 74 mg/dL (ref 0–149)
VLDL Cholesterol Cal: 15 mg/dL (ref 5–40)

## 2018-06-10 LAB — GLUCOSE, RANDOM: Glucose: 84 mg/dL (ref 65–99)

## 2018-06-10 MED ORDER — EVOLOCUMAB 140 MG/ML ~~LOC~~ SOAJ: 140.0000 mg | SUBCUTANEOUS | 3 refills | Status: DC

## 2018-06-10 NOTE — Progress Notes (Signed)
Mario [name] will you let patient know the below and I also released to his MYCHART.   Your LDL cholesterol is elevated at 119 this is high- normal range is 0-99. Please maintain a low-cholesterol, low-fat diet.  Recommend discussing with your primary care physician and your cardiologist.  Regular health maintenance is recommended with primary care.

## 2018-06-10 NOTE — Telephone Encounter (Signed)
Received message yesterday about pt's Repatha coverage. Submitted through insurance for coverage and received fax back that medication is covered on plan and does not require a prior authorization. Pt should be able to get from pharmacy.  Attempted to reach pt and both numbers listed do not have ability to leave message.

## 2018-06-10 NOTE — Telephone Encounter (Signed)
Follow Up:; ° ° °Returning your call. °

## 2018-06-10 NOTE — Telephone Encounter (Signed)
Spoke with patient and relayed a message from [place]ana [place], [facility] about his [place]. Patient is aware that Repatha is now covered under his insurance plan without PA and he will resume once he receives the medication. He thanked me for the call.

## 2018-06-10 NOTE — Telephone Encounter (Signed)
Spoke with [employer] and they are able to fill. Pt will bring copay card to [facility] and is aware to pick up medication today.

## 2018-07-29 ENCOUNTER — Telehealth: Payer: Self-pay

## 2018-07-29 NOTE — Telephone Encounter (Signed)
   Primary Cardiologist:  Mertie Moores, MD   Patient contacted.  History reviewed.  No symptoms to suggest any unstable cardiac conditions.  Based on discussion, with current pandemic situation, we will be postponing this appointment for [name] with a plan for f/u in 6-12 wks or sooner if feasible/necessary.  If symptoms change, he has been instructed to contact our office.   Routing to C19 CANCEL pool for tracking (P CV DIV CV19 CANCEL - reason for visit "other.") and assigning priority (1 = 4-6 wks, 2 = 6-12 wks, 3 = >12 wks).   Jones Broom, CMA  07/29/2018 10:56 AM         .

## 2018-08-05 ENCOUNTER — Telehealth: Payer: Self-pay | Admitting: Nurse Practitioner

## 2018-08-05 NOTE — Telephone Encounter (Signed)
Called [facility]  Has gained some weight - 245 lbs today ( up 9 lbs from oct. 2019) Has been trying to eat better  No CP  Mild dyspnea - thinks it may be partially due to allergeies Walks 1-2 miles without any cp or dyspnea  still has some claudication.  BP has been ok  132/80 Is not on Repatha ( insurance issues )   He spends most of his day in a seated position.  Ive advised him to get up and walk around more. Continue limit his salt intake. The leg swelling sounds minimal  ( ~ 1/8 " indention from his socks in the evening )  He will call back next week and let us know how he is doing     Mertie Moores, MD  08/05/2018 3:59 PM    [facility] [address]  [place], [place]  [address] Pager [phone] Phone: [phone]; Fax: [phone]

## 2018-08-05 NOTE — Telephone Encounter (Signed)
Patient returning call.

## 2018-08-05 NOTE — Telephone Encounter (Signed)
Called patient to reschedule his appointment with Dr. Acie Fredrickson to August per his request due to Covid 19 reschedule. He states he is feeling fine but then as we continued in the conversation he admits that he has been holding onto extra weight despite eating much less than he used to eat. He c/o abdominal tightness and bilateral leg swelling, currently more noticeable in the RLE. He denies orthopnea and SOB but states he feels full all of the time, even when he has not eaten. There is no echo report in Epic; [place] April 2019 revealed EF of 65% by visual estimate with no regional wall motion abnormalities. I advised patient that I will forward note to Dr. Acie Fredrickson for advice and call him back. He verbalized understanding and agreement and thanked me for the call.

## 2018-08-05 NOTE — Telephone Encounter (Signed)
I have tried to call [place] twice today  Will try back tomorrow    Mertie Moores, MD  08/05/2018 3:12 PM    [facility] [place],  [place], [place]  [address] Pager [phone] Phone: [phone]; Fax: [phone]

## 2018-08-17 ENCOUNTER — Ambulatory Visit: Payer: Managed Care, Other (non HMO) | Admitting: Cardiovascular Disease

## 2018-08-18 ENCOUNTER — Telehealth: Payer: Self-pay | Admitting: Pharmacist

## 2018-08-18 NOTE — Telephone Encounter (Signed)
Spoke with patient and his letter is from several weeks ago. He does not know the exact date, but believes it is from before 4/21. He has been without Repatha since Feb and will just wait for an approval through insurance before resuming. He is aware to call if he receives another letter in case they do not notify our office of result of appeals.

## 2018-08-18 NOTE — Telephone Encounter (Signed)
LMOM for pt to see if he needs Repatha samples temporarily. We have submitted an appeals on 4/21 and are waiting to hear back from the insurance company. Will also check what date patient received his denial letter to make sure he has not heard more recent news than our appeals sent 4/21.

## 2018-08-18 NOTE — Telephone Encounter (Signed)
-----   Message from Emmaline Life, RN sent at 08/18/2018  1:28 PM EDT ----- Marykay Lex ladies,  Can one of you follow-up regarding patient states he has not been able to get his Repatha. Claiborne Billings spoke with him in February and he was supposed to be able to get it at [facility] but patient states since that time he got a letter from [name] stating it had not shown enough evidence for improvement.   Thank you, Sharyn Lull

## 2018-08-19 ENCOUNTER — Telehealth: Payer: Self-pay | Admitting: Pharmacist

## 2018-08-19 MED ORDER — EVOLOCUMAB 140 MG/ML ~~LOC~~ SOAJ: 140.0000 mg | SUBCUTANEOUS | 3 refills | Status: DC

## 2018-08-19 NOTE — Telephone Encounter (Signed)
Received letter from [place] today that patient has been approved for Repatha. Appeals had been overturned. Called patient and made aware. Will send rx to pharmacy.

## 2018-10-06 ENCOUNTER — Encounter: Payer: Self-pay | Admitting: Adult Health

## 2018-10-06 ENCOUNTER — Ambulatory Visit: Payer: Managed Care, Other (non HMO) | Admitting: Adult Health

## 2018-10-06 ENCOUNTER — Other Ambulatory Visit: Payer: Self-pay

## 2018-10-06 VITALS — BP 150/86 | HR 82 | Temp 98.4°F | Resp 16

## 2018-10-06 DIAGNOSIS — L720 Epidermal cyst: Secondary | ICD-10-CM | POA: Diagnosis not present

## 2018-10-06 DIAGNOSIS — R229 Localized swelling, mass and lump, unspecified: Secondary | ICD-10-CM

## 2018-10-06 MED ORDER — CLINDAMYCIN HCL 300 MG PO CAPS: 300.0000 mg | ORAL_CAPSULE | Freq: Three times a day (TID) | ORAL | 0 refills | Status: DC

## 2018-10-06 NOTE — Progress Notes (Signed)
[facility]  Subjective:     Patient ID: Mario [name], male   DOB: 22-Oct-1960, 58 y.o.   MRN: [id number]  HPI  Patient is a 58 year old male in no acute distress who comes to the clinic for complaint of a cyst on his left upper back that has been present x 1 year. He reports it has become larger in the past week. He reports it has become painful in the past two days. Denies any discharge.  He reports he has had to have cyst removed in the past as well. This area is new.  Patient  denies any fever, body aches,chills, rash, chest pain, shortness of breath, nausea, vomiting, or diarrhea.     Review of Systems  Constitutional: Negative.   Respiratory: Negative.   Cardiovascular: Negative.   Gastrointestinal: Negative for abdominal distention, constipation, diarrhea, nausea and vomiting.  Musculoskeletal: Negative.   Skin: Negative for color change, pallor, rash and wound.       Area left upper back " hard knot " per patient  Neurological: Negative for dizziness, light-headedness, numbness and headaches.  Psychiatric/Behavioral: Negative.        Objective:   Physical Exam Vitals signs reviewed.  Constitutional:      General: He is not in acute distress.    Appearance: Normal appearance. He is not ill-appearing, toxic-appearing or diaphoretic.  HENT:     Head: Normocephalic and atraumatic.     Nose: Nose normal.     Mouth/Throat:     Mouth: Mucous membranes are moist.     Pharynx: No oropharyngeal exudate or posterior oropharyngeal erythema.  Eyes:     Extraocular Movements: Extraocular movements intact.     Pupils: Pupils are equal, round, and reactive to light.  Cardiovascular:     Rate and Rhythm: Normal rate and regular rhythm.     Pulses: Normal pulses.     Heart sounds: Normal heart sounds.  Pulmonary:     Effort: Pulmonary effort is normal. No respiratory distress.     Breath sounds: Normal breath sounds. No stridor. No  wheezing, rhonchi or rales.  Chest:     Chest wall: No tenderness.  Abdominal:     General: There is no distension.     Palpations: Abdomen is soft.  Skin:    General: Skin is warm and dry.     Capillary Refill: Capillary refill takes less than 2 seconds.     Findings: Rash (No rash - dermal nodule as marked on diagram and measuring approximately 4cm x 4xcm in size, no erythema, painful to touch, no drainage or opening. ) present. Rash is nodular (No rash/ area marked on diagram  - smooth dermal nodule as marked on diagram and measuring approximately 4cm x 4xcm in size, no erythema, painful to touch, no drainage or opening. ).       Neurological:     Mental Status: He is alert and oriented to person, place, and time.     Gait: Gait normal.  Psychiatric:        Mood and Affect: Mood normal.        Behavior: Behavior normal.        Thought Content: Thought content normal.        Judgment: Judgment normal.        Assessment:Plan:     Mario [name] was seen today for abscess.  Diagnoses and all orders for this visit:  Epidermal cyst -  Ambulatory referral to Dermatology  Skin nodule -     Ambulatory referral to Dermatology  Other orders -     clindamycin (CLEOCIN) 300 MG capsule; Take 1 capsule (300 mg total) by mouth 3 (three) times daily.  He will call the office if he has not heard from the dermatologist for an appointment within one week.  Referral to dermatology for excision of likely large epidermal cyst present x 1 year.  Advised patient call the office or your primary care doctor for an appointment if no improvement within 72 hours or if any symptoms change or worsen at any time  Advised ER or urgent Care if after hours or on weekend. Call 911 for emergency symptoms at any time.Patinet verbalized understanding of all instructions given/reviewed and treatment plan and has no further questions or concerns at this time.    Patient verbalized understanding of all instructions  given and denies any further questions at this time.

## 2018-10-06 NOTE — Patient Instructions (Signed)
Epidermal Cyst    An epidermal cyst is a small, painless lump under your skin. The cyst contains a grayish-white, bad-smelling substance (keratin). Do not try to pop or open an epidermal cyst yourself.  What are the causes?   A blocked hair follicle.   A hair that curls and re-enters the skin instead of growing straight out of the skin.   A blocked pore.   Irritated skin.   An injury to the skin.   Certain conditions that are passed along from parent to child (inherited).   Human papillomavirus (HPV).   Long-term sun damage to the skin.  What increases the risk?   Having acne.   Being overweight.   Being 30-40 years old.  What are the signs or symptoms?  These cysts are usually harmless, but they can get infected. Symptoms of infection may include:   Redness.   Inflammation.   Tenderness.   Warmth.   Fever.   A grayish-white, bad-smelling substance drains from the cyst.   Pus drains from the cyst.  How is this treated?  In many cases, epidermal cysts go away on their own without treatment. If a cyst becomes infected, treatment may include:   Opening and draining the cyst, done by a doctor. After draining, you may need minor surgery to remove the rest of the cyst.   Antibiotic medicine.   Shots of medicines (steroids) that help to reduce inflammation.   Surgery to remove the cyst. Surgery may be done if the cyst:  ? Becomes large.  ? Bothers you.  ? Has a chance of turning into cancer.   Do not try to open a cyst yourself.  Follow these instructions at home:   Take over-the-counter and prescription medicines only as told by your doctor.   If you were prescribed an antibiotic medicine, take it it as told by your doctor. Do not stop using the antibiotic even if you start to feel better.   Keep the area around your cyst clean and dry.   Wear loose, dry clothing.   Avoid touching your cyst.   Check your cyst every day for signs of infection. Check for:  ? Redness, swelling, or pain.  ? Fluid  or blood.  ? Warmth.  ? Pus or a bad smell.   Keep all follow-up visits as told by your doctor. This is important.  How is this prevented?   Wear clean, dry, clothing.   Avoid wearing tight clothing.   Keep your skin clean and dry. Take showers or baths every day.  Contact a doctor if:   Your cyst has symptoms of infection.   Your condition does not improve or gets worse.   You have a cyst that looks different from other cysts you have had.   You have a fever.  Get help right away if:   Redness spreads from the cyst into the area close by.  Summary   An epidermal cyst is a sac made of skin tissue.   If a cyst becomes infected, treatment may include surgery to open and drain the cyst, or to remove it.   Take over-the-counter and prescription medicines only as told by your doctor.   Contact a doctor if your condition is not improving or is getting worse.   Keep all follow-up visits as told by your doctor. This is important.  This information is not intended to replace advice given to you by your health care provider. Make sure you discuss any   questions you have with your health care provider.  Document Released: 05/16/2004 Document Revised: 10/20/2017 Document Reviewed: 02/08/2015  Elsevier Interactive Patient Education  2019 Elsevier Inc.

## 2018-10-28 ENCOUNTER — Other Ambulatory Visit: Payer: Self-pay | Admitting: Cardiovascular Disease

## 2018-11-19 ENCOUNTER — Other Ambulatory Visit: Payer: Self-pay

## 2019-01-26 ENCOUNTER — Other Ambulatory Visit: Payer: Self-pay | Admitting: Cardiovascular Disease

## 2019-02-23 NOTE — Progress Notes (Signed)
Mario [name] Date of Birth  [birth year]       [place]     [id number], [place]   [place],   [address]    [phone]      Fax  [phone]      Problem List: 1.  Coronary artery disease-status post PTCA and stenting in 2004 2.  Hyperlipidemia 3. Hypertension     Mario [name] is a 58 yo with hx of CAD.,  He's had an upper respiratory tract viral infection for the past month or so.  He denies any chest pain.  He has been able to do of his normal activities without any significant problems. He works at the [employer] in [place].  [name]. 26, 2015: Mario [name] is doing well.  No angina.   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   BP has been elevated .  He has been tried on HCTZ in the past but it made him feel poorly.    August 17, 2013:  November 16, 2013: Bodies doing well from a cardiac standpoint. His medical doctor recently changed his Nexium to products because of the interaction and potential in addition of Plavix metabolism. He's noted that he's had lots more indigestion and heartburn since stopping the Nexium.  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 We discussed checking a P2 Y  12 assay with him on Plavix and Nexium to prove that he is still inhibited.  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 He notes that he bleeds quite readily if he cuts himself.  November 06, 2015: Mario [name] is seen after a 2 year absence. He has a history of coronary artery disease and hypertension BP has been elevated  Still eats some salty foods.  No CP,  Still very active   July 01, 2016:  Mario [name] has been having some problems with elevated blood pressure recently.   He had a question of some mental status changes March  1. .? TIA symptoms .  Slurring works, [name]  ,   BP was better 170/100.  No further confusion .    Has gained 20 lbs over the winter.   Not getting any exercise.   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, 2018:  Mario [name] is seen today . Having right calf claudication  ABIs look great.  Dr. Lucky Cowboy wants to do a PV angiogram  Mario [name] wants a 2nd opinion.   Sep 02, 2017:  Right is seen back today for follow-up visit.  Seen with his wife ,  Mario [name] .    Since I last saw him he has had coronary stenting to the proximal left circumflex artery and obtuse marginal artery.  He is started back smoking again unfortunately. Has now stopped smoking as of this month. Trying to exercise ,  Walking some  Says his claudication is better  Lower extremity arterial duplex scan reveals a right 75 to 99% stenosis in the distal SFA Left ankle-brachial indexes are normal.  Was walking recently.  Has some dizziness after walking 1 miles   LDL was 86.    Chol = 185  October 13, 2017:  Having some right sided neck , upper chest pain , bilateral upper chest pain Not  Exertional,  [name], dipping, [name], [name]   January 21, 2018:  Mario [name] is seen today for follow-up visit. Tries to walk daily . Still not smoking  Has  separated from his wife Mario [name].  Does not watch his carbs   February 24, 2019: Mario [name] seen today for a follow-up visit.   Wt. Is 243 lbs.   He has a history of coronary artery disease peripheral vascular disease.  Also has a history of hypertension and hyperlipidemia. Has been having some stomach pain  BP has been elevated  No angina  No claudication Has stopped smoking    Current Outpatient Medications on File Prior to Visit  Medication Sig Dispense Refill  . aspirin 81 MG tablet Take 81 mg daily by mouth.     . cilostazol (PLETAL) 100 MG tablet Take 1 tablet (100 mg total) by mouth daily. 30 tablet 3  . clopidogrel (PLAVIX) 75 MG tablet Take 1 tablet (75 mg total) by mouth daily. 90 tablet 3  . ezetimibe (ZETIA) 10 MG tablet TAKE 1 TABLET BY MOUTH DAILY 90 tablet 0  . isosorbide mononitrate (IMDUR) 30 MG 24 hr tablet     . losartan (COZAAR) 100 MG tablet TAKE 1 TABLET BY MOUTH DAILY 90 tablet 3  . nitroGLYCERIN (NITROSTAT) 0.4 MG SL tablet Place 1 tablet (0.4 mg total) under the tongue every 5 (five) minutes x 3 doses as needed for chest pain. 25 tablet 6   No current facility-administered medications on file prior to visit.     Allergies  Allergen Reactions  . Crestor [Rosuvastatin Calcium]     Joint pain.  He is able to take a low dose every other day.   . Lipitor [Atorvastatin Calcium]     Joint pain  . Zocor [Simvastatin]     Joint pain    Past Medical History:  Diagnosis Date  . Arthritis   . CAD (coronary artery disease)   . HLD (hyperlipidemia)   . Recurrent chest pain   . Sleep apnea    cpap    Past Surgical History:  Procedure Laterality Date  . COLONOSCOPY WITH PROPOFOL N/A 11/17/2015   Procedure: COLONOSCOPY WITH PROPOFOL;  Surgeon: Lucilla Lame, MD;  Location: [facility];  Service: Endoscopy;  Laterality: N/A;  CPAP  . CORONARY STENT INTERVENTION N/A 08/19/2017   Procedure: CORONARY STENT INTERVENTION;  Surgeon: Nelva Bush, MD;  Location:  [facility];  Service: Cardiovascular;  Laterality: N/A;  . INTRAVASCULAR PRESSURE WIRE/FFR STUDY N/A 08/19/2017   Procedure: INTRAVASCULAR PRESSURE WIRE/FFR STUDY;  Surgeon: Nelva Bush, MD;  Location:  [facility];  Service: Cardiovascular;  Laterality: N/A;  . LEFT HEART CATH AND CORONARY ANGIOGRAPHY N/A 08/19/2017   Procedure: LEFT HEART CATH AND CORONARY ANGIOGRAPHY;  Surgeon: Nelva Bush, MD;  Location: [facility];  Service: Cardiovascular;  Laterality: N/A;  . NASAL SINUS SURGERY    . POLYPECTOMY N/A 11/17/2015   Procedure: POLYPECTOMY;  Surgeon: Lucilla Lame, MD;  Location: [facility];  Service: Endoscopy;  Laterality: N/A;  sigmoid polyp  . PTCA     3 stents    Social History   Tobacco Use  Smoking Status Former Smoker  . Quit date: 04/22/2001  . Years since quitting: 17.8  Smokeless Tobacco Former Systems developer  . Types: Snuff    Social History   Substance and Sexual Activity  Alcohol Use Yes   Comment: 2 beers per day    Family History  Problem Relation Age of Onset  . Hyperlipidemia Mother   . Hypertension Mother   . Heart disease Mother   . Heart failure Mother        88  . Lung cancer Mother   . Leukemia Father   . Hypertension Sister   . Hypertension Sister   . Coronary artery disease Unknown     Reviw of Systems:  Reviewed in the HPI.  All other systems are negative.   Physical Exam: Blood pressure 140/90, pulse 65, height 5' 8.5" (1.74 m), weight 243 lb 8 oz (110.5 kg).  GEN:  Well nourished, well developed in no acute distress HEENT: Normal NECK: No JVD; No carotid bruits LYMPHATICS: No lymphadenopathy CARDIAC: RRR   RESPIRATORY:  Clear to auscultation without rales, wheezing or rhonchi  ABDOMEN: Soft, non-tender, non-distended MUSCULOSKELETAL:  No edema; No deformity  SKIN: Warm and dry NEUROLOGIC:  Alert and oriented x 3  ECG:      February 24, 2019: Normal sinus rhythm at 63 beats a minute.  No ST or T  wave changes.  Assessment / Plan:   1.   CAD : He is not having any episodes of angina.  Continue current medications.  2.  Hypertension: His blood pressure is mildly elevated.  I suspect some of this is due to his weight gain.  We will start him on hydrochlorothiazide 25 mg a day, potassium chloride 20 mEq a day.  We will check a basic metabolic profile in 3 weeks.  I will see him back in approximately 3 months for follow-up visit.  3.  Peripheral vascular disease:    Denies any claudication.  Continue aspirin, Plavix, Pletal.  4.  Hyperlipidemia:   -  He did great on Repatha but his insurance quit paying for it.  He is currently on Zetia.  I will send a message to our pharmacist to see if there is any way that we can get his insurance company to pay for [place].  He has severe coronary artery disease and peripheral vascular disease.   Mertie Moores, MD  02/24/2019 8:08 AM    [place],  [place], [place]  [address] Pager [phone] Phone: [phone]; Fax: [phone]

## 2019-02-24 ENCOUNTER — Other Ambulatory Visit: Payer: Self-pay

## 2019-02-24 ENCOUNTER — Encounter: Payer: Self-pay | Admitting: Cardiovascular Disease

## 2019-02-24 ENCOUNTER — Ambulatory Visit: Payer: Managed Care, Other (non HMO) | Admitting: Cardiovascular Disease

## 2019-02-24 VITALS — BP 140/90 | HR 65 | Ht 68.5 in | Wt 243.5 lb

## 2019-02-24 DIAGNOSIS — I2510 Atherosclerotic heart disease of native coronary artery without angina pectoris: Secondary | ICD-10-CM

## 2019-02-24 DIAGNOSIS — I739 Peripheral vascular disease, unspecified: Secondary | ICD-10-CM

## 2019-02-24 DIAGNOSIS — I10 Essential (primary) hypertension: Secondary | ICD-10-CM

## 2019-02-24 DIAGNOSIS — E782 Mixed hyperlipidemia: Secondary | ICD-10-CM

## 2019-02-24 MED ORDER — NITROGLYCERIN 0.4 MG SL SUBL: 0.4000 mg | SUBLINGUAL_TABLET | SUBLINGUAL | 3 refills | Status: DC | PRN

## 2019-02-24 MED ORDER — HYDROCHLOROTHIAZIDE 25 MG PO TABS: 25.0000 mg | ORAL_TABLET | Freq: Every day | ORAL | 3 refills | Status: DC

## 2019-02-24 MED ORDER — POTASSIUM CHLORIDE CRYS ER 20 MEQ PO TBCR: 20.0000 meq | EXTENDED_RELEASE_TABLET | Freq: Every day | ORAL | 3 refills | Status: DC

## 2019-02-24 NOTE — Patient Instructions (Addendum)
Medication Instructions:  Your physician has recommended you make the following change in your medication:  START HCTZ (Hydrochlorothiazide) 25 mg once daily START Kdur (Potassium chloride) 20 mEq once daily  *If you need a refill on your cardiac medications before your next appointment, please call your pharmacy*   Lab Work: Your physician recommends that you return for lab work in: 3 weeks for basic metabolic panel on November 24. You may come in anytime between 7:30 am and 4:45 pm  If you have labs (blood work) drawn today and your tests are completely normal, you will receive your results only by: [name] MyChart Message (if you have MyChart) OR . A paper copy in the mail If you have any lab test that is abnormal or we need to change your treatment, we will call you to review the results.   Testing/Procedures: None Ordered   Follow-Up: At [facility], you and your health needs are our priority.  As part of our continuing mission to provide you with exceptional heart care, we have created designated Provider Care Teams.  These Care Teams include your primary Cardiologist (physician) and Advanced Practice Providers (APPs -  Physician Assistants and Nurse Practitioners) who all work together to provide you with the care you need, when you need it.  Your next appointment:   3 months on February 10 at 8:00 am with Dr. Acie Fredrickson  The format for your next appointment:   In Person  Provider:   You may see Mertie Moores, MD or one of the following Advanced Practice Providers on your designated Care Team:    Richardson Dopp, PA-C  [place], [place]  Daune Perch, [place]

## 2019-03-16 ENCOUNTER — Other Ambulatory Visit: Payer: Managed Care, Other (non HMO)

## 2019-05-20 ENCOUNTER — Encounter: Payer: Self-pay | Admitting: Gastroenterology

## 2019-06-02 ENCOUNTER — Ambulatory Visit: Payer: Managed Care, Other (non HMO) | Admitting: Cardiovascular Disease

## 2019-08-31 ENCOUNTER — Other Ambulatory Visit: Payer: Self-pay

## 2019-08-31 MED ORDER — CLOPIDOGREL BISULFATE 75 MG PO TABS: 75.0000 mg | ORAL_TABLET | Freq: Every day | ORAL | 1 refills | Status: DC

## 2019-08-31 MED ORDER — LOSARTAN POTASSIUM 100 MG PO TABS: 100.0000 mg | ORAL_TABLET | Freq: Every day | ORAL | 1 refills | Status: DC

## 2019-08-31 MED ORDER — EZETIMIBE 10 MG PO TABS: 10.0000 mg | ORAL_TABLET | Freq: Every day | ORAL | 1 refills | Status: DC

## 2019-09-30 ENCOUNTER — Encounter: Payer: Self-pay | Admitting: Cardiovascular Disease

## 2019-09-30 ENCOUNTER — Other Ambulatory Visit: Payer: Managed Care, Other (non HMO)

## 2019-09-30 ENCOUNTER — Other Ambulatory Visit: Payer: Self-pay

## 2019-09-30 ENCOUNTER — Ambulatory Visit: Payer: Managed Care, Other (non HMO) | Admitting: Cardiovascular Disease

## 2019-09-30 VITALS — BP 146/84 | HR 72 | Ht 69.0 in | Wt 244.5 lb

## 2019-09-30 DIAGNOSIS — E782 Mixed hyperlipidemia: Secondary | ICD-10-CM

## 2019-09-30 DIAGNOSIS — I10 Essential (primary) hypertension: Secondary | ICD-10-CM | POA: Diagnosis not present

## 2019-09-30 DIAGNOSIS — I739 Peripheral vascular disease, unspecified: Secondary | ICD-10-CM | POA: Diagnosis not present

## 2019-09-30 DIAGNOSIS — N400 Enlarged prostate without lower urinary tract symptoms: Secondary | ICD-10-CM

## 2019-09-30 DIAGNOSIS — I2510 Atherosclerotic heart disease of native coronary artery without angina pectoris: Secondary | ICD-10-CM | POA: Diagnosis not present

## 2019-09-30 DIAGNOSIS — R5383 Other fatigue: Secondary | ICD-10-CM

## 2019-09-30 LAB — TSH: TSH: 0.741 u[IU]/mL (ref 0.450–4.500)

## 2019-09-30 LAB — CBC
Hematocrit: 44.6 % (ref 37.5–51.0)
Hemoglobin: 15.1 g/dL (ref 13.0–17.7)
MCH: 30.8 pg (ref 26.6–33.0)
MCHC: 33.9 g/dL (ref 31.5–35.7)
MCV: 91 fL (ref 79–97)
Platelets: 218 10*3/uL (ref 150–450)
RBC: 4.9 x10E6/uL (ref 4.14–5.80)
RDW: 12.7 % (ref 11.6–15.4)
WBC: 4.4 10*3/uL (ref 3.4–10.8)

## 2019-09-30 LAB — BASIC METABOLIC PANEL
BUN/Creatinine Ratio: 16 (ref 9–20)
BUN: 15 mg/dL (ref 6–24)
CO2: 22 mmol/L (ref 20–29)
Calcium: 9.4 mg/dL (ref 8.7–10.2)
Chloride: 105 mmol/L (ref 96–106)
Creatinine, Ser: 0.96 mg/dL (ref 0.76–1.27)
GFR calc Af Amer: 102 mL/min/{1.73_m2} (ref >59)
GFR calc non Af Amer: 88 mL/min/{1.73_m2} (ref >59)
Glucose: 96 mg/dL (ref 65–99)
Potassium: 4.6 mmol/L (ref 3.5–5.2)
Sodium: 139 mmol/L (ref 134–144)

## 2019-09-30 LAB — HEPATIC FUNCTION PANEL
ALT: 37 IU/L (ref 0–44)
AST: 21 IU/L (ref 0–40)
Albumin: 4.6 g/dL (ref 3.8–4.9)
Alkaline Phosphatase: 63 IU/L (ref 48–121)
Bilirubin Total: 0.6 mg/dL (ref 0.0–1.2)
Bilirubin, Direct: 0.17 mg/dL (ref 0.00–0.40)
Total Protein: 6.5 g/dL (ref 6.0–8.5)

## 2019-09-30 LAB — LIPID PANEL
Chol/HDL Ratio: 3.4 ratio (ref 0.0–5.0)
Cholesterol, Total: 220 mg/dL — ABNORMAL HIGH (ref 100–199)
HDL: 65 mg/dL (ref >39)
LDL Chol Calc (NIH): 138 mg/dL — ABNORMAL HIGH (ref 0–99)
Triglycerides: 99 mg/dL (ref 0–149)
VLDL Cholesterol Cal: 17 mg/dL (ref 5–40)

## 2019-09-30 LAB — PSA: Prostate Specific Ag, Serum: 0.9 ng/mL (ref 0.0–4.0)

## 2019-09-30 NOTE — Patient Instructions (Signed)
Medication Instructions:  Your physician recommends that you continue on your current medications as directed. Please refer to the Current Medication list given to you today.  *If you need a refill on your cardiac medications before your next appointment, please call your pharmacy*   Lab Work: TODAY - cholesterol, liver panel, TSH, CBC, PSA, BMET If you have labs (blood work) drawn today and your tests are completely normal, you will receive your results only by: [name] MyChart Message (if you have MyChart) OR . A paper copy in the mail If you have any lab test that is abnormal or we need to change your treatment, we will call you to review the results.   Testing/Procedures: None Ordered   Follow-Up: At [facility], you and your health needs are our priority.  As part of our continuing mission to provide you with exceptional heart care, we have created designated Provider Care Teams.  These Care Teams include your primary Cardiologist (physician) and Advanced Practice Providers (APPs -  Physician Assistants and Nurse Practitioners) who all work together to provide you with the care you need, when you need it.    Your next appointment:   1 year(s)  The format for your next appointment:   In Person  Provider:   You may see Mertie Moores, MD or one of the following Advanced Practice Providers on your designated Care Team:    Richardson Dopp, PA-C  [place], [place]

## 2019-09-30 NOTE — Progress Notes (Signed)
[name] Date of Birth  30-Jul-1960       [name]     [address] [place]   [name], [name]  [address]    [phone]      Fax  [phone]      Problem List: 1.  Coronary artery disease-status post PTCA and stenting in 2004 2.  Hyperlipidemia 3. Hypertension     [name] is a 59 yo with hx of CAD.,  He's had an upper respiratory tract viral infection for the past month or so.  He denies any chest pain.  He has been able to do of his normal activities without any significant problems. He works at the [employer] in [place].  [name]. 26, 2015: [name] is doing well.  No angina.   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   BP has been elevated .  He has been tried on HCTZ in the past but it made him feel poorly.    August 17, 2013:  November 16, 2013: Bodies doing well from a cardiac standpoint. His medical doctor recently changed his Nexium to products because of the interaction and potential in addition of Plavix metabolism. He's noted that he's had lots more indigestion and heartburn since stopping the Nexium.  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 We discussed checking a P2 Y  12 assay with him on Plavix and Nexium to prove that he is still inhibited.  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 He notes that he bleeds quite readily if he cuts himself.  November 06, 2015: [name] is seen after a 2 year absence. He has a history of coronary artery disease and hypertension BP has been elevated  Still eats some salty foods.  No CP,  Still very active   July 01, 2016:  [name] has been having some problems with elevated blood pressure recently.   He had a question of some mental status changes March  1. .? TIA symptoms .  Slurring works, [name]  ,   BP was better  170/100. No further confusion .    Has gained 20 lbs over the winter.   Not getting any exercise.   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, 2018:  [name] is seen today . Having right calf claudication  ABIs look great.  Dr. Lucky Cowboy wants to do a PV angiogram  Khale wants a 2nd opinion.   Sep 02, 2017:  Right is seen back today for follow-up visit.  Seen with his wife ,  [name] ,  Walking some  Says his claudication is better  Lower extremity arterial duplex scan reveals a right 75 to 99% stenosis in the distal SFA Left ankle-brachial indexes are normal.  Was walking recently.  Has some dizziness after walking 1 miles   LDL was 86.    Chol = 185  October 13, 2017:  Having some right sided neck , upper chest pain , bilateral upper chest pain Not  Exertional,  [name], dipping, [name], [name]   January 21, 2018:  Brach is seen today for follow-up visit. Tries to walk daily . Still not smoking  Has  separated from his wife [name].  Does not watch his carbs   February 24, 2019: Salbador seen today for a follow-up visit.   Wt. Is 243 lbs.   He has a history of coronary artery disease peripheral vascular disease.  Also has a history of hypertension and hyperlipidemia. Has been having some stomach pain  BP has been elevated  No angina  No claudication Has stopped smoking   June 10 ,2021  Deago is seen today for follow-up of his coronary artery disease and peripheral vascular disease.  He also has a history of hypertension and hyperlipidemia. No CP or claudication  Has stopped Pletal, HCTZ, Imdyur and Kdur  Feeling better  Is gaining weight , having some fatigue - will check TSH  Avoids salty foods.    Current Outpatient Medications on File Prior to Visit  Medication Sig Dispense Refill  . aspirin 81 MG tablet Take 81 mg daily by mouth.     . cilostazol (PLETAL) 100 MG tablet Take 1 tablet (100 mg total) by mouth daily. 30 tablet 3  . clopidogrel (PLAVIX) 75 MG tablet Take 1 tablet (75 mg total) by mouth daily. 90 tablet 1  . ezetimibe (ZETIA) 10 MG tablet Take 1 tablet (10 mg total) by mouth daily. 90 tablet 1  . hydrochlorothiazide (HYDRODIURIL) 25 MG tablet Take 1 tablet (25 mg total) by mouth daily. 90 tablet 3  . isosorbide mononitrate (IMDUR) 30 MG 24 hr tablet     . losartan (COZAAR) 100 MG tablet Take 1 tablet (100 mg total) by mouth daily. 90 tablet 1  . nitroGLYCERIN (NITROSTAT) 0.4 MG SL tablet Place 1 tablet (0.4 mg total) under the tongue every 5 (five) minutes x 3 doses as needed for chest pain. 75 tablet 3   No current facility-administered medications on file prior to visit.    Allergies  Allergen Reactions  . Crestor [Rosuvastatin Calcium]     Joint pain.  He is able to take a low dose every other day.   . Lipitor [Atorvastatin Calcium]     Joint pain  . Zocor [Simvastatin]     Joint pain    Past Medical History:  Diagnosis Date  . Arthritis   . CAD  (coronary artery disease)   . HLD (hyperlipidemia)   . Recurrent chest pain   . Sleep apnea    cpap    Past Surgical History:  Procedure Laterality Date  . COLONOSCOPY WITH PROPOFOL N/A 11/17/2015   Procedure: COLONOSCOPY WITH PROPOFOL;  Surgeon: Lucilla Lame, MD;  Location: [facility];  Service: Endoscopy;  Laterality: N/A;  CPAP  . CORONARY STENT INTERVENTION N/A 08/19/2017   Procedure: CORONARY STENT INTERVENTION;  Surgeon: Nelva Bush, MD;  Location: [facility];  Service: Cardiovascular;  Laterality: N/A;  . INTRAVASCULAR PRESSURE WIRE/FFR STUDY N/A 08/19/2017   Procedure: INTRAVASCULAR PRESSURE WIRE/FFR STUDY;  Surgeon: Nelva Bush, MD;  Location: [facility];  Service: Cardiovascular;  Laterality: N/A;  . LEFT HEART CATH AND CORONARY ANGIOGRAPHY N/A 08/19/2017   Procedure: LEFT HEART CATH AND CORONARY ANGIOGRAPHY;  Surgeon: Nelva Bush, MD;  Location: [facility];  Service: Cardiovascular;  Laterality: N/A;  . NASAL SINUS SURGERY    . POLYPECTOMY N/A 11/17/2015   Procedure: POLYPECTOMY;  Surgeon: Lucilla Lame, MD;  Location: [facility];  Service: Endoscopy;  Laterality: N/A;  sigmoid polyp  . PTCA     3 stents    Social History   Tobacco Use  Smoking Status Former Smoker  . Quit date: 04/22/2001  . Years since quitting: 18.4  Smokeless Tobacco Former Systems developer  . Types: Snuff    Social History   Substance and Sexual Activity  Alcohol Use Yes   Comment: 2 beers per day    Family History  Problem Relation Age of Onset  . Hyperlipidemia Mother   . Hypertension Mother   . Heart disease Mother   . Heart failure Mother        21  . Lung cancer Mother   . Leukemia Father   . Hypertension Sister   . Hypertension Sister   . Coronary artery disease Unknown     Reviw of Systems:  Reviewed in the HPI.  All other systems are negative.  Physical Exam: There were no vitals taken for this visit.  GEN:  Well nourished,  well developed in no acute distress HEENT: Normal NECK: No JVD; No carotid bruits LYMPHATICS: No lymphadenopathy CARDIAC: RRR , no murmurs, rubs, gallops RESPIRATORY:  Clear to auscultation without rales, wheezing or rhonchi  ABDOMEN: Soft, non-tender, non-distended MUSCULOSKELETAL:  No edema; No deformity  SKIN: Warm and dry NEUROLOGIC:  Alert and oriented x 3   ECG:        Assessment / Plan:   1.   CAD :    No angina .   2.  Hypertension:   He will work on weight loss and exercise   3.  Peripheral vascular disease:   No claudication   4.  Hyperlipidemia:   -   Check lipids today ,  Cont zetia.  Work on diet, weight loss     Mertie Moores, MD  09/30/2019 8:32 AM    [facility] [address],  [place], [place]  [address] Pager [phone] Phone: [phone]; Fax: [phone]

## 2020-02-10 ENCOUNTER — Ambulatory Visit: Payer: Managed Care, Other (non HMO) | Admitting: Physician Assistant

## 2020-02-10 ENCOUNTER — Encounter: Payer: Self-pay | Admitting: Physician Assistant

## 2020-02-10 ENCOUNTER — Other Ambulatory Visit: Payer: Self-pay

## 2020-02-10 VITALS — BP 158/90 | HR 85 | Temp 98.3°F | Resp 15 | Ht 69.0 in | Wt 243.0 lb

## 2020-02-10 DIAGNOSIS — Z0000 Encounter for general adult medical examination without abnormal findings: Secondary | ICD-10-CM

## 2020-02-10 DIAGNOSIS — Z008 Encounter for other general examination: Secondary | ICD-10-CM | POA: Diagnosis not present

## 2020-02-10 NOTE — Progress Notes (Signed)
Subjective:    Patient ID: Mario [name], male    DOB: 1961-03-17, 59 y.o.   MRN: [id number]  HPI 59 yo M  Who works Engineer, materials for [employer] presents for [employer] and brief exam  Virgle has had cardiovascular disease for many years.  First heart attack was at age 89; stenting 2004 PTCA,  and more recently MI about 2 years ago 2019 at 63 per patient Followed routinely by cardiologist . Denies chest pain. Dr Cleatrice Burke of [facility]  (actually one of his [employer] classmates-[employer] !) He was seen most recently in June 2021 at which time total cholesterol 220,  LDL 138 and glucose 96.  2 stents- had additional stent 2019 continues to have awareness of chest tightness with increased exertion but is felt to be doing well. Given clearance for one year this past visit.   Encouraged to lose weight. Has had issues with claudication in past- stopped smoking and it has improved. Walk at comfort level adjust to weather temp and humidity- push gently.  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  Sees DDS q 6 months   PCP-Dr Adrian Prows,  Review of Systems   Has recently divorced and feels that stress level has greatly improved    Arthritis :  B  Elbows with reduced extension Objective:   Physical Exam Vitals and nursing note reviewed.  Constitutional:      General: He is not in acute distress.    Appearance: He is obese.  HENT:     Head: Normocephalic and atraumatic.     Right Ear: Tympanic membrane, ear canal and external ear normal.     Left Ear: Tympanic membrane, ear canal and external ear normal.     Ears:     Comments: Cerumen bilateral canals , non obstructive    Nose: Nose normal.     Mouth/Throat:     Mouth: Mucous membranes are moist.     Comments: DDS q 6 Eyes:     Extraocular Movements: Extraocular  movements intact.     Conjunctiva/sclera: Conjunctivae normal.     Comments: Glasses- reports up to date exam  Cardiovascular:     Rate and Rhythm: Normal rate and regular rhythm.     Pulses: Normal pulses.     Heart sounds: Normal heart sounds.  Pulmonary:     Effort: Pulmonary effort is normal.     Breath sounds: Normal breath sounds.  Abdominal:     General: Bowel sounds are normal.     Palpations: Abdomen is soft.  Genitourinary:    Comments: exam deferred, denies concerns Musculoskeletal:        General: Tenderness and deformity present.     Cervical back: Normal range of motion and neck supple.     Comments: arthrits : Hands wrists elbows  Strength reduced, still maintains good function, joints tender  Skin:    General: Skin is warm and dry.     Capillary Refill: Capillary refill takes less than 2 seconds.  Neurological:     General: No focal deficit present.     Mental Status: He is alert.  Psychiatric:        Mood and Affect: Mood normal.       Assessment & Plan:  Either overweight or under-tall-- change whichever you can !  Glad  you have taken charge of making dietary adjustments in health food choices and portion control Add daily walk as discussed.  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

## 2020-05-16 MED ORDER — NITROGLYCERIN 0.4 MG SL SUBL: 0.4000 mg | SUBLINGUAL_TABLET | SUBLINGUAL | 3 refills | Status: DC | PRN

## 2020-05-16 MED ORDER — LOSARTAN POTASSIUM 100 MG PO TABS: 100.0000 mg | ORAL_TABLET | Freq: Every day | ORAL | 1 refills | Status: DC

## 2020-05-16 MED ORDER — EZETIMIBE 10 MG PO TABS: 10.0000 mg | ORAL_TABLET | Freq: Every day | ORAL | 1 refills | Status: DC

## 2020-05-16 MED ORDER — CLOPIDOGREL BISULFATE 75 MG PO TABS: 75.0000 mg | ORAL_TABLET | Freq: Every day | ORAL | 1 refills | Status: DC

## 2020-05-17 ENCOUNTER — Other Ambulatory Visit: Payer: Self-pay | Admitting: Cardiovascular Disease

## 2020-08-01 ENCOUNTER — Other Ambulatory Visit: Payer: Self-pay | Admitting: Physician Assistant

## 2020-08-01 ENCOUNTER — Other Ambulatory Visit: Payer: Self-pay

## 2020-08-01 ENCOUNTER — Ambulatory Visit
Admission: RE | Admit: 2020-08-01 | Discharge: 2020-08-01 | Disposition: A | Discharge status: Home / Self Care | Payer: Managed Care, Other (non HMO) | Source: Ambulatory Visit | Attending: Physician Assistant | Admitting: Physician Assistant

## 2020-08-01 DIAGNOSIS — S0990XA Unspecified injury of head, initial encounter: Secondary | ICD-10-CM | POA: Diagnosis present

## 2020-08-01 DIAGNOSIS — L03211 Cellulitis of face: Secondary | ICD-10-CM | POA: Insufficient documentation

## 2020-08-01 DIAGNOSIS — R4020 Unspecified coma: Secondary | ICD-10-CM | POA: Diagnosis present

## 2020-10-01 ENCOUNTER — Encounter: Payer: Self-pay | Admitting: Cardiovascular Disease

## 2020-10-01 NOTE — Progress Notes (Signed)
Mario [name] Date of Birth  30-Jul-1960       Mario [name]     [address] [place]   Mario [name], Mario [name]  [address]    [phone]      Fax  [phone]      Problem List: 1.  Coronary artery disease-status post PTCA and stenting in 2004 2.  Hyperlipidemia 3. Hypertension     Mario [name] is a 60 yo with hx of CAD.,  He's had an upper respiratory tract viral infection for the past month or so.  He denies any chest pain.  He has been able to do of his normal activities without any significant problems. He works at the [employer] in [place].  [name]. 26, 2015: Mario [name] is doing well.  No angina.   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   BP has been elevated .  He has been tried on HCTZ in the past but it made him feel poorly.    August 17, 2013:  November 16, 2013: Bodies doing well from a cardiac standpoint. His medical doctor recently changed his Nexium to products because of the interaction and potential in addition of Plavix metabolism. He's noted that he's had lots more indigestion and heartburn since stopping the Nexium.  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 We discussed checking a P2 Y  12 assay with him on Plavix and Nexium to prove that he is still inhibited.  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 He notes that he bleeds quite readily if he cuts himself.  November 06, 2015: Mario [name] is seen after a 2 year absence. He has a history of coronary artery disease and hypertension BP has been elevated  Still eats some salty foods.  No CP,  Still very active   July 01, 2016:  Mario [name] has been having some problems with elevated blood pressure recently.   He had a question of some mental status changes March  1. .? TIA symptoms .  Slurring works, [name]  ,   BP was better  170/100. No further confusion .    Has gained 20 lbs over the winter.   Not getting any exercise.   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, 2018:  Mario [name] is seen today . Having right calf claudication  ABIs look great.  Dr. Lucky Cowboy wants to do a PV angiogram  Khale wants a 2nd opinion.   Sep 02, 2017:  Right is seen back today for follow-up visit.  Seen with his wife ,  Mario [name] .    Since I last saw him he has had coronary stenting to the proximal left circumflex artery and obtuse marginal artery.  He is started back smoking again unfortunately. Has now stopped smoking as of this month. Trying to exercise ,  Walking some  Says his claudication is better  Lower extremity arterial duplex scan reveals a right 75 to 99% stenosis in the distal SFA Left ankle-brachial indexes are normal.  Was walking recently.  Has some dizziness after walking 1 miles   LDL was 86.    Chol = 185  October 13, 2017:  Having some right sided neck , upper chest pain , bilateral upper chest pain Not  Exertional,  [name], dipping, [name], [name]   January 21, 2018:  Mario [name] is seen today for follow-up visit. Tries to walk daily . Still not smoking  Has  separated from his wife Mario [name].  Does not watch his carbs   February 24, 2019: Mario [name] seen today for a follow-up visit.   Wt. Is 243 lbs.   He has a history of coronary artery disease peripheral vascular disease.  Also has a history of hypertension and hyperlipidemia. Has been having some stomach pain  BP has been elevated  No angina  No claudication Has stopped smoking   June 10 ,2021  Mario [name] is seen today for follow-up of his coronary artery disease and peripheral vascular disease.  He also has a history of hypertension and hyperlipidemia. No CP or claudication  Has stopped Pletal, HCTZ, Imdyur and Kdur  Feeling better  Is gaining weight , having some fatigue - will check TSH  Avoids salty foods.    October 02, 2020: Mario [name] is seen today for follow up of his CAD, PVC, HTN, HLD No cp, No claudication .   Had some "funny feeling" many months ago,  took NTG.  Resolved Has not had any sense. Trying to exercise ,  limited by arthritis .    Current Outpatient Medications on File Prior to Visit  Medication Sig Dispense Refill   aspirin 81 MG tablet Take 81 mg daily by mouth.      clopidogrel (PLAVIX) 75 MG tablet TAKE 1 TABLET DAILY 90 tablet 3   ezetimibe (ZETIA) 10 MG tablet TAKE 1 TABLET DAILY 90 tablet 3   losartan (COZAAR) 100 MG tablet TAKE 1 TABLET DAILY 90 tablet 3   nitroGLYCERIN (NITROSTAT) 0.4 MG SL tablet PLACE 1 TABLET (0.4 MG TOTAL) UNDER TONGUE EVERY 5 MINUTES FOR 3 DOSES AS NEEDED FOR CHEST PAIN 75 tablet 0   No current facility-administered medications on file prior to visit.    Allergies  Allergen Reactions   Crestor [Rosuvastatin Calcium]     Joint pain.  He is able to take a low dose every other day.    Lipitor [Atorvastatin Calcium]     Joint pain   Zocor [Simvastatin]     Joint pain    Past Medical History:  Diagnosis Date   Arthritis    CAD (coronary artery disease)    HLD (hyperlipidemia)    Recurrent chest pain    Sleep apnea    cpap     Past Surgical History:  Procedure Laterality Date   COLONOSCOPY WITH PROPOFOL N/A 11/17/2015   Procedure: COLONOSCOPY WITH PROPOFOL;  Surgeon: Lucilla Lame, MD;  Location: [facility];  Service: Endoscopy;  Laterality: N/A;  CPAP   CORONARY STENT INTERVENTION N/A 08/19/2017   Procedure: CORONARY STENT INTERVENTION;  Surgeon: Nelva Bush, MD;  Location: [facility];  Service: Cardiovascular;  Laterality: N/A;   INTRAVASCULAR PRESSURE WIRE/FFR STUDY N/A 08/19/2017   Procedure: INTRAVASCULAR PRESSURE WIRE/FFR STUDY;  Surgeon: Nelva Bush, MD;  Location: [facility];  Service: Cardiovascular;  Laterality: N/A;   LEFT HEART CATH AND CORONARY ANGIOGRAPHY N/A 08/19/2017   Procedure: LEFT HEART CATH AND CORONARY ANGIOGRAPHY;  Surgeon: Nelva Bush, MD;  Location: [facility];  Service: Cardiovascular;  Laterality: N/A;   NASAL SINUS SURGERY     POLYPECTOMY N/A 11/17/2015   Procedure: POLYPECTOMY;  Surgeon: Lucilla Lame, MD;  Location: [facility];  Service: Endoscopy;  Laterality: N/A;  sigmoid polyp   PTCA     3 stents    Social History   Tobacco Use  Smoking Status Former   Pack years: 0.00  Smokeless Tobacco Former   Types: Snuff    Social History   Substance and Sexual Activity  Alcohol Use Yes   Comment: 2 beers per day    Family History  Problem Relation Age of Onset   Hyperlipidemia Mother    Hypertension Mother    Heart disease Mother    Heart failure Mother        78   Lung cancer Mother    Leukemia Father    Hypertension Sister    Hypertension Sister    Coronary artery disease Unknown     Reviw of Systems:  Reviewed in the HPI.  All other systems are negative.  Physical Exam: Blood pressure 130/90, pulse 64, height 5\' 9"  (1.753 m), weight 229 lb 12.8 oz (104.2 kg), SpO2 98 %.  GEN:  Well nourished, well developed in no acute distress HEENT: Normal NECK: No JVD; No carotid bruits LYMPHATICS: No  lymphadenopathy CARDIAC: RRR , no murmurs, rubs, gallops RESPIRATORY:  Clear to auscultation without rales, wheezing or rhonchi  ABDOMEN: Soft, non-tender, non-distended MUSCULOSKELETAL:  No edema; No deformity  SKIN: Warm and dry NEUROLOGIC:  Alert and oriented x 3    ECG:       NSR at 64.  No ST or T wave changes.   Assessment / Plan:   1.   CAD :     no Angina .   Check lipids, ALT, BMP today    2.  Hypertension:   well controlled.  Watching his salt   3.  Peripheral vascular disease:    no claudication   4.  Hyperlipidemia:   -    check lipids, ALT, BMP today     Mertie Moores, MD  10/02/2020 10:53 AM    [facility],  [place], [place]  [address] Pager [phone] Phone: [phone]; Fax: [phone]

## 2020-10-02 ENCOUNTER — Encounter: Payer: Self-pay | Admitting: Cardiovascular Disease

## 2020-10-02 ENCOUNTER — Other Ambulatory Visit: Payer: Self-pay

## 2020-10-02 ENCOUNTER — Ambulatory Visit: Payer: Managed Care, Other (non HMO) | Admitting: Cardiovascular Disease

## 2020-10-02 VITALS — BP 130/90 | HR 64 | Ht 69.0 in | Wt 229.8 lb

## 2020-10-02 DIAGNOSIS — I2510 Atherosclerotic heart disease of native coronary artery without angina pectoris: Secondary | ICD-10-CM | POA: Diagnosis not present

## 2020-10-02 NOTE — Patient Instructions (Signed)
Medication Instructions:  Your physician recommends that you continue on your current medications as directed. Please refer to the Current Medication list given to you today.  *If you need a refill on your cardiac medications before your next appointment, please call your pharmacy*   Lab Work: TODAY: Lipids, ALT, BMET  If you have labs (blood work) drawn today and your tests are completely normal, you will receive your results only by: [place] (if you have MyChart) OR A paper copy in the mail If you have any lab test that is abnormal or we need to change your treatment, we will call you to review the results.   Testing/Procedures:  none   Follow-Up:  At [facility], you and your health needs are our priority.  As part of our continuing mission to provide you with exceptional heart care, we have created designated Provider Care Teams.  These Care Teams include your primary Cardiologist (physician) and Advanced Practice Providers (APPs -  Physician Assistants and Nurse Practitioners) who all work together to provide you with the care you need, when you need it.  We recommend signing up for the patient portal called "MyChart".  Sign up information is provided on this After Visit Summary.  MyChart is used to connect with patients for Virtual Visits (Telemedicine).  Patients are able to view lab/test results, encounter notes, upcoming appointments, etc.  Non-urgent messages can be sent to your provider as well.   To learn more about what you can do with MyChart, go to [email or web address].    Your next appointment:    1 year(s)  The format for your next appointment:    In Person  Provider:    Kathlyn Sacramento, MD   Other Instructions  none

## 2021-08-29 ENCOUNTER — Other Ambulatory Visit: Payer: Self-pay | Admitting: Cardiovascular Disease

## 2021-09-18 NOTE — Progress Notes (Unsigned)
Electrophysiology Office Follow up Visit Note:    Date:  09/19/2021   ID:  Mario [name], DOB 11-06-1960, MRN [id number]  PCP:  Leonel Ramsay, MD  [facility] Cardiologist:  Mertie Moores, MD  [facility] Electrophysiologist:  Vickie Epley, MD    Interval History:    Mario [name] is a 61 y.o. male who presents for a follow up visit.  He was last seen by Dr Acie Fredrickson on 10/02/2020. His BP were well controlled at that visit.He presents today for follow up.   He tells me he saw a clinic at work and his blood pressure was over 678 mmHg systolic.  He went back for recheck and was still elevated.  They started him on chlorthalidone and his come back down to his prior levels of 938B systolic.  He has tolerated the chlorthalidone well.  No ischemic symptoms today.  He does tell me that he has had a cold for some time and now is going to his chest.  He has some sinus pressure associated with the symptoms.  No fever.  He has a productive cough for greater than 7 days.       Past Medical History:  Diagnosis Date   Arthritis    CAD (coronary artery disease)    HLD (hyperlipidemia)    Recurrent chest pain    Sleep apnea    cpap    Past Surgical History:  Procedure Laterality Date   COLONOSCOPY WITH PROPOFOL N/A 11/17/2015   Procedure: COLONOSCOPY WITH PROPOFOL;  Surgeon: Lucilla Lame, MD;  Location: [place];  Service: Endoscopy;  Laterality: N/A;  CPAP   CORONARY STENT INTERVENTION N/A 08/19/2017   Procedure: CORONARY STENT INTERVENTION;  Surgeon: Nelva Bush, MD;  Location: [place] CV [facility];  Service: Cardiovascular;  Laterality: N/A;   INTRAVASCULAR PRESSURE WIRE/FFR STUDY N/A 08/19/2017   Procedure: INTRAVASCULAR PRESSURE WIRE/FFR STUDY;  Surgeon: Nelva Bush, MD;  Location: [place] [facility];  Service: Cardiovascular;  Laterality: N/A;   LEFT HEART CATH AND CORONARY ANGIOGRAPHY N/A 08/19/2017   Procedure: LEFT HEART CATH AND CORONARY  ANGIOGRAPHY;  Surgeon: Nelva Bush, MD;  Location: [place] [facility];  Service: Cardiovascular;  Laterality: N/A;   NASAL SINUS SURGERY     POLYPECTOMY N/A 11/17/2015   Procedure: POLYPECTOMY;  Surgeon: Lucilla Lame, MD;  Location: [place];  Service: Endoscopy;  Laterality: N/A;  sigmoid polyp   PTCA     3 stents    Current Medications: Current Meds  Medication Sig   aspirin 81 MG tablet Take 81 mg daily by mouth.    chlorthalidone (HYGROTON) 25 MG tablet Take 25 mg by mouth daily.   clopidogrel (PLAVIX) 75 MG tablet Take 1 tablet (75 mg total) by mouth daily. Please call [phone] to schedule an appointment for future refills. Thank you. 1st attempt.   ezetimibe (ZETIA) 10 MG tablet Take 1 tablet (10 mg total) by mouth daily. Please call [phone] to schedule an appointment for future refills. Thank you. 1st attempt.   losartan (COZAAR) 100 MG tablet Take 1 tablet (100 mg total) by mouth daily. Please call [phone] to schedule an appointment for future refills. Thank you. 1st attempt.   nitroGLYCERIN (NITROSTAT) 0.4 MG SL tablet PLACE 1 TABLET (0.4 MG TOTAL) UNDER TONGUE EVERY 5 MINUTES FOR 3 DOSES AS NEEDED FOR CHEST PAIN     Allergies:   Crestor [rosuvastatin calcium], Lipitor [atorvastatin calcium], and Zocor [simvastatin]   Social History   Socioeconomic  History   Marital status: Married    Spouse name: Not on file   Number of children: 3   Years of education: 12   Highest education level: Not on file  Occupational History   Not on file  Tobacco Use   Smoking status: Former   Smokeless tobacco: Former    Types: Snuff  Vaping Use   Vaping Use: Never used  Substance and Sexual Activity   Alcohol use: Yes    Comment: 2 beers per day   Drug use: No   Sexual activity: Not on file  Other Topics Concern   Not on file  Social History Narrative   Lives at home with his wife.   Right-handed.   No daily caffeine use.   Social Determinants of  Health   Financial Resource Strain: Not on file  Food Insecurity: Not on file  Transportation Needs: Not on file  Physical Activity: Not on file  Stress: Not on file  Social Connections: Not on file     Family History: The patient's family history includes Coronary artery disease in his unknown relative; Heart disease in his mother; Heart failure in his mother; Hyperlipidemia in his mother; Hypertension in his mother, sister, and sister; Leukemia in his father; Lung cancer in his mother.  ROS:   Please see the history of present illness.    All other systems reviewed and are negative.  EKGs/Labs/Other Studies Reviewed:    The following studies were reviewed today:     Recent Labs: No results found for requested labs within last 8760 hours.  Recent Lipid Panel    Component Value Date/Time   CHOL 220 (H) 09/30/2019 0902   CHOL 217 (H) 10/09/2015 1609   TRIG 99 09/30/2019 0902   TRIG 207 (H) 10/09/2015 1609   HDL 65 09/30/2019 0902   CHOLHDL 3.4 09/30/2019 0902   VLDL 41 (H) 10/09/2015 1609   LDLCALC 138 (H) 09/30/2019 0902    Physical Exam:    VS:  BP (!) 144/88   Pulse 71   Ht '5\' 9"'$  (1.753 m)   Wt 235 lb 9.6 oz (106.9 kg)   SpO2 95%   BMI 34.79 kg/m     Wt Readings from Last 3 Encounters:  09/19/21 235 lb 9.6 oz (106.9 kg)  10/02/20 229 lb 12.8 oz (104.2 kg)  02/10/20 243 lb (110.2 kg)     GEN:  Well nourished, well developed in no acute distress HEENT: Normal NECK: No JVD; No carotid bruits LYMPHATICS: No lymphadenopathy CARDIAC: RRR, no murmurs, rubs, gallops RESPIRATORY:  Clear to auscultation without rales, wheezing or rhonchi  ABDOMEN: Soft, non-tender, non-distended MUSCULOSKELETAL:  No edema; No deformity  SKIN: Warm and dry NEUROLOGIC:  Alert and oriented x 3 PSYCHIATRIC:  Normal affect        ASSESSMENT:    1. Essential hypertension   2. Coronary artery disease involving native coronary artery of native heart without angina pectoris     PLAN:    In order of problems listed above:  #Hypertension More controlled now in the 140s on chlorthalidone and losartan.  I would like to recheck his BMP and mag now he has been on chlorthalidone for about a week.  He will need close primary care follow-up.  He should buy an arm cuff and check his blood pressures 1-2 times per week and record these values.  These should be brought to his primary care physician for further medication regimen titration.  #Coronary artery disease Prior  stenting. Continue aspirin and Plavix.  Continue Zetia.  Has not tolerated statins in the past.  Given his elevated lipids and history of coronary artery disease and young age, I will refer him to our lipid clinic for statin alternatives.  #Chest cold/sinus infection Augmentin for 7 days.  Take with food.  If does not improve symptoms, follow-up with primary care physician.    Medication Adjustments/Labs and Tests Ordered: Current medicines are reviewed at length with the patient today.  Concerns regarding medicines are outlined above.  No orders of the defined types were placed in this encounter.  No orders of the defined types were placed in this encounter.    Signed, Lars Mage, MD, [facility], [facility] 09/19/2021 9:34 AM    Electrophysiology [facility]

## 2021-09-19 ENCOUNTER — Other Ambulatory Visit
Admission: RE | Admit: 2021-09-19 | Discharge: 2021-09-19 | Disposition: A | Discharge status: Home / Self Care | Payer: Managed Care, Other (non HMO) | Attending: Cardiology | Admitting: Cardiology

## 2021-09-19 ENCOUNTER — Ambulatory Visit: Payer: Managed Care, Other (non HMO) | Admitting: Cardiology

## 2021-09-19 ENCOUNTER — Encounter: Payer: Self-pay | Admitting: Cardiology

## 2021-09-19 VITALS — BP 144/88 | HR 71 | Ht 69.0 in | Wt 235.6 lb

## 2021-09-19 DIAGNOSIS — I10 Essential (primary) hypertension: Secondary | ICD-10-CM | POA: Insufficient documentation

## 2021-09-19 DIAGNOSIS — I2510 Atherosclerotic heart disease of native coronary artery without angina pectoris: Secondary | ICD-10-CM | POA: Insufficient documentation

## 2021-09-19 LAB — BASIC METABOLIC PANEL
Anion gap: 10 (ref 5–15)
BUN: 12 mg/dL (ref 6–20)
CO2: 26 mmol/L (ref 22–32)
Calcium: 9.8 mg/dL (ref 8.9–10.3)
Chloride: 97 mmol/L — ABNORMAL LOW (ref 98–111)
Creatinine, Ser: 0.83 mg/dL (ref 0.61–1.24)
GFR, Estimated: >60 mL/min (ref >60)
Glucose, Bld: 109 mg/dL — ABNORMAL HIGH (ref 70–99)
Potassium: 4 mmol/L (ref 3.5–5.1)
Sodium: 133 mmol/L — ABNORMAL LOW (ref 135–145)

## 2021-09-19 LAB — MAGNESIUM: Magnesium: 2.3 mg/dL (ref 1.7–2.4)

## 2021-09-19 MED ORDER — AMOXICILLIN-POT CLAVULANATE 875-125 MG PO TABS: 1.0000 | ORAL_TABLET | Freq: Two times a day (BID) | ORAL | 0 refills | Status: AC

## 2021-09-19 NOTE — Patient Instructions (Addendum)
Medications: Your physician recommends that you continue on your current medications as directed. Please refer to the Current Medication list given to you today. *If you need a refill on your cardiac medications before your next appointment, please call your pharmacy*  Lab Work: None. If you have labs (blood work) drawn today and your tests are completely normal, you will receive your results only by: [place] (if you have MyChart) OR A paper copy in the mail If you have any lab test that is abnormal or we need to change your treatment, we will call you to review the results.  Testing/Procedures: BMP, MAG  Follow-Up: At [facility], you and your health needs are our priority.  As part of our continuing mission to provide you with exceptional heart care, we have created designated Provider Care Teams.  These Care Teams include your primary Cardiologist (physician) and Advanced Practice Providers (APPs -  Physician Assistants and Nurse Practitioners) who all work together to provide you with the care you need, when you need it.  Your physician wants you to follow-up in: 12 months with one of the following Advanced Practice Providers on your designated Care Team:    Ignacia Bayley, NP Christell Faith PA Cadence Kathlen Mody PA    You will receive a reminder letter in the mail two months in advance. If you don't receive a letter, please call our office to schedule the follow-up appointment.  We recommend signing up for the patient portal called "MyChart".  Sign up information is provided on this After Visit Summary.  MyChart is used to connect with patients for Virtual Visits (Telemedicine).  Patients are able to view lab/test results, encounter notes, upcoming appointments, etc.  Non-urgent messages can be sent to your provider as well.   To learn more about what you can do with MyChart, go to [email or web address].    Any Other Special Instructions Will Be Listed Below (If Applicable).

## 2021-10-09 ENCOUNTER — Ambulatory Visit: Payer: Managed Care, Other (non HMO)

## 2021-10-17 ENCOUNTER — Encounter: Payer: Self-pay | Admitting: Cardiology

## 2021-10-18 MED ORDER — EZETIMIBE 10 MG PO TABS: 10.0000 mg | ORAL_TABLET | Freq: Every day | ORAL | 0 refills | Status: DC

## 2021-10-18 MED ORDER — LOSARTAN POTASSIUM 100 MG PO TABS: 100.0000 mg | ORAL_TABLET | Freq: Every day | ORAL | 0 refills | Status: DC

## 2021-10-18 MED ORDER — CHLORTHALIDONE 25 MG PO TABS: 25.0000 mg | ORAL_TABLET | Freq: Every day | ORAL | 0 refills | Status: DC

## 2021-10-18 MED ORDER — CLOPIDOGREL BISULFATE 75 MG PO TABS: 75.0000 mg | ORAL_TABLET | Freq: Every day | ORAL | 0 refills | Status: DC

## 2021-10-19 NOTE — Telephone Encounter (Signed)
Attempted to schedule no ans no vm  

## 2021-10-30 NOTE — Telephone Encounter (Signed)
Attempted to schedule.  

## 2021-10-31 ENCOUNTER — Encounter: Payer: Self-pay | Admitting: *Deleted

## 2021-11-05 NOTE — Telephone Encounter (Signed)
Scheduled

## 2021-12-18 ENCOUNTER — Encounter: Payer: Self-pay | Admitting: Cardiovascular Disease

## 2021-12-18 ENCOUNTER — Other Ambulatory Visit: Payer: Self-pay | Admitting: *Deleted

## 2021-12-18 MED ORDER — CLOPIDOGREL BISULFATE 75 MG PO TABS: 75.0000 mg | ORAL_TABLET | Freq: Every day | ORAL | 0 refills | Status: DC

## 2021-12-18 MED ORDER — LOSARTAN POTASSIUM 100 MG PO TABS: ORAL_TABLET | ORAL | 0 refills | Status: DC

## 2021-12-18 MED ORDER — CHLORTHALIDONE 25 MG PO TABS: 25.0000 mg | ORAL_TABLET | Freq: Every day | ORAL | 0 refills | Status: DC

## 2021-12-18 MED ORDER — EZETIMIBE 10 MG PO TABS: 10.0000 mg | ORAL_TABLET | Freq: Every day | ORAL | 0 refills | Status: DC

## 2021-12-31 NOTE — Progress Notes (Unsigned)
Cardiology Office Note  Date:  01/01/2022   ID:  Mario [name], DOB November 26, 1960, MRN [id number]  PCP:  Mario Ramsay, MD   Chief Complaint  Patient presents with   Establish Care    Former Dr. Acie [name] patient needing to establish care for CAD. Medications reviewed by the patient verbally. "Doing well."     HPI:  [name] is a 61 year old gentleman with past medical history of Hypertension Smoker, quit <2018 Coronary artery disease, post PTCA and stenting in 2004  stenting April 2019 to the proximal left circumflex artery and obtuse marginal artery.  Who presents for new patient evaluation of his coronary disease  In follow-up today reports he is doing relatively well, denies chest pain concerning for angina Active at baseline limited by chronic right low back pain with occasional sciatica Discussed prior cardiac anatomy Initial stenting 2004 Cath 07/2017 Successful FFR-guided bifurcation PCI to LCx and OM1 with balloon angioplasty of in-stent restenosis (2 layers of overlapping stent from 2004) and drug-eluting stent placement to the jailed AV groove LCx (T-stenting technique).    History of hyperlipidemia, poorly controlled Statin myalgias, tried various statins Repatha: bruising/swelling at injection site Currently only on Zetia which she is tolerating Prior cholesterol greater than 200, no recent lipid panel, last in June 2021  EKG personally reviewed by myself on todays visit Normal sinus rhythm rate 73 bpm no significant ST-T wave changes  PMH:   has a past medical history of Arthritis, CAD (coronary artery disease), HLD (hyperlipidemia), Recurrent chest pain, and Sleep apnea.  PSH:    Past Surgical History:  Procedure Laterality Date   COLONOSCOPY WITH PROPOFOL N/A 11/17/2015   Procedure: COLONOSCOPY WITH PROPOFOL;  Surgeon: Mario Lame, MD;  Location: [place];  Service: Endoscopy;  Laterality: N/A;  CPAP   CORONARY STENT INTERVENTION N/A  08/19/2017   Procedure: CORONARY STENT INTERVENTION;  Surgeon: Mario Bush, MD;  Location: [place] [facility];  Service: Cardiovascular;  Laterality: N/A;   INTRAVASCULAR PRESSURE WIRE/FFR STUDY N/A 08/19/2017   Procedure: INTRAVASCULAR PRESSURE WIRE/FFR STUDY;  Surgeon: Mario Bush, MD;  Location: [place] [facility];  Service: Cardiovascular;  Laterality: N/A;   LEFT HEART CATH AND CORONARY ANGIOGRAPHY N/A 08/19/2017   Procedure: LEFT HEART CATH AND CORONARY ANGIOGRAPHY;  Surgeon: Mario Bush, MD;  Location: [place] CV LAB;  Service: Cardiovascular;  Laterality: N/A;   NASAL SINUS SURGERY     POLYPECTOMY N/A 11/17/2015   Procedure: POLYPECTOMY;  Surgeon: Mario Lame, MD;  Location: [place];  Service: Endoscopy;  Laterality: N/A;  sigmoid polyp   PTCA     3 stents    Current Outpatient Medications  Medication Sig Dispense Refill   aspirin 81 MG tablet Take 81 mg daily by mouth.      chlorthalidone (HYGROTON) 25 MG tablet Take 1 tablet (25 mg total) by mouth daily. 30 tablet 0   clopidogrel (PLAVIX) 75 MG tablet Take 1 tablet (75 mg total) by mouth daily. Must keep follow up appointment for additional refills. 30 tablet 0   ezetimibe (ZETIA) 10 MG tablet Take 1 tablet (10 mg total) by mouth daily. Must keep follow up appointment for additional refills. 30 tablet 0   losartan (COZAAR) 100 MG tablet Take 1 tablet (100 mg) by mouth once daily. Must keep follow up appointment for additional refills. 30 tablet 0   nitroGLYCERIN (NITROSTAT) 0.4 MG SL tablet PLACE 1 TABLET (0.4 MG TOTAL) UNDER TONGUE EVERY 5 MINUTES FOR 3  DOSES AS NEEDED FOR CHEST PAIN 75 tablet 0   No current facility-administered medications for this visit.     Allergies:   Atorvastatin calcium, Rosuvastatin calcium, Simvastatin, Crestor [rosuvastatin calcium], and Lipitor [atorvastatin calcium]   Social History:  The patient  reports that he has quit smoking. He has quit using smokeless  tobacco.  His smokeless tobacco use included snuff. He reports current alcohol use. He reports that he does not use drugs.   Family History:   family history includes Coronary artery disease in an other family member; Heart disease in his mother; Heart failure in his mother; Hyperlipidemia in his mother; Hypertension in his mother, sister, and sister; Leukemia in his father; Lung cancer in his mother.    Review of Systems: Review of Systems  Constitutional: Negative.   HENT: Negative.    Respiratory: Negative.    Cardiovascular: Negative.   Gastrointestinal: Negative.   Musculoskeletal: Negative.   Neurological: Negative.   Psychiatric/Behavioral: Negative.    All other systems reviewed and are negative.    PHYSICAL EXAM: VS:  Ht '5\' 9"'$  (1.753 m)   Wt 243 lb 9 oz (110.5 kg)   BMI 35.97 kg/m  , BMI Body mass index is 35.97 kg/m. GEN: Well nourished, well developed, in no acute distress HEENT: normal Neck: no JVD, carotid bruits, or masses Cardiac: RRR; no murmurs, rubs, or gallops,no edema  Respiratory:  clear to auscultation bilaterally, normal work of breathing GI: soft, nontender, nondistended, + BS MS: no deformity or atrophy Skin: warm and dry, no rash Neuro:  Strength and sensation are intact Psych: euthymic mood, full affect   Recent Labs: 09/19/2021: BUN 12; Creatinine, Ser 0.83; Magnesium 2.3; Potassium 4.0; Sodium 133    Lipid Panel Lab Results  Component Value Date   CHOL 220 (H) 09/30/2019   HDL 65 09/30/2019   LDLCALC 138 (H) 09/30/2019   TRIG 99 09/30/2019      Wt Readings from Last 3 Encounters:  01/01/22 243 lb 9 oz (110.5 kg)  09/19/21 235 lb 9.6 oz (106.9 kg)  10/02/20 229 lb 12.8 oz (104.2 kg)      ASSESSMENT AND PLAN:  Problem List Items Addressed This Visit       Cardiology Problems   Hypertension   CAD (coronary artery disease) - Primary   Hyperlipidemia   TIA (transient ischemic attack)   Coronary disease with stable  angina Currently with no symptoms of angina. No further workup at this time.  Stressed importance of aggressive lipid management and blood pressure control  Hyperlipidemia Reports statin myalgias, also reaction to Repatha at injection site Recommend we consider Leqvio, referral placed to lipid clinic Goal LDL less than 70  Essential hypertension After long discussion, recommended he change his losartan over to amlodipine /olmesartan 10/40 mg daily Stay on chlorthalidone Monitor blood pressure at home and call us with some numbers We did look at triple combo pill combining HCTZ but this was expensive     Total encounter time more than 40 minutes  Greater than 50% was spent in counseling and coordination of care with the patient    Signed, Esmond Plants, M.D., Ph.D. [place], [place]

## 2022-01-01 ENCOUNTER — Encounter: Payer: Self-pay | Admitting: Cardiovascular Disease

## 2022-01-01 ENCOUNTER — Other Ambulatory Visit
Admission: RE | Admit: 2022-01-01 | Discharge: 2022-01-01 | Disposition: A | Discharge status: Home / Self Care | Payer: Managed Care, Other (non HMO) | Source: Ambulatory Visit | Attending: Cardiovascular Disease | Admitting: Cardiovascular Disease

## 2022-01-01 ENCOUNTER — Ambulatory Visit: Payer: Managed Care, Other (non HMO) | Attending: Cardiovascular Disease | Admitting: Cardiovascular Disease

## 2022-01-01 VITALS — BP 160/100 | HR 73 | Ht 69.0 in | Wt 243.6 lb

## 2022-01-01 DIAGNOSIS — I25118 Atherosclerotic heart disease of native coronary artery with other forms of angina pectoris: Secondary | ICD-10-CM

## 2022-01-01 DIAGNOSIS — E782 Mixed hyperlipidemia: Secondary | ICD-10-CM | POA: Diagnosis present

## 2022-01-01 DIAGNOSIS — G459 Transient cerebral ischemic attack, unspecified: Secondary | ICD-10-CM

## 2022-01-01 DIAGNOSIS — I10 Essential (primary) hypertension: Secondary | ICD-10-CM

## 2022-01-01 DIAGNOSIS — M791 Myalgia, unspecified site: Secondary | ICD-10-CM

## 2022-01-01 DIAGNOSIS — T466X5A Adverse effect of antihyperlipidemic and antiarteriosclerotic drugs, initial encounter: Secondary | ICD-10-CM

## 2022-01-01 LAB — LIPID PANEL
Cholesterol: 240 mg/dL — ABNORMAL HIGH (ref 0–200)
HDL: 80 mg/dL (ref >40)
LDL Cholesterol: 146 mg/dL — ABNORMAL HIGH (ref 0–99)
Total CHOL/HDL Ratio: 3 RATIO
Triglycerides: 68 mg/dL (ref <150)
VLDL: 14 mg/dL (ref 0–40)

## 2022-01-01 MED ORDER — AMLODIPINE-OLMESARTAN 10-40 MG PO TABS: 1.0000 | ORAL_TABLET | Freq: Every day | ORAL | 3 refills | Status: DC

## 2022-01-01 NOTE — Patient Instructions (Addendum)
Medication Instructions:   STOP Losartan 2. START olmesartan amlodipine combo pill 10/40 mg daily Stay on on chlorthalidone  If you need a refill on your cardiac medications before your next appointment, please call your pharmacy.   Lab work: Your physician recommends that you have lab work completed at the medical mall - LIPID  Testing/Procedures: No new testing needed  Follow-Up: At [facility], you and your health needs are our priority.  As part of our continuing mission to provide you with exceptional heart care, we have created designated Provider Care Teams.  These Care Teams include your primary Cardiologist (physician) and Advanced Practice Providers (APPs -  Physician Assistants and Nurse Practitioners) who all work together to provide you with the care you need, when you need it.  You will need a follow up appointment in 6 months  Providers on your designated Care Team:   Murray Hodgkins, NP Christell Faith, PA-C Cadence Kathlen Mody, [place]  COVID-19 Vaccine Information can be found at: [email or web address] For questions related to vaccine distribution or appointments, please email [email or web address] or call [phone].

## 2022-01-29 ENCOUNTER — Other Ambulatory Visit: Payer: Self-pay

## 2022-01-29 MED ORDER — CHLORTHALIDONE 25 MG PO TABS: 25.0000 mg | ORAL_TABLET | Freq: Every day | ORAL | 1 refills | Status: DC

## 2022-01-29 MED ORDER — CLOPIDOGREL BISULFATE 75 MG PO TABS: 75.0000 mg | ORAL_TABLET | Freq: Every day | ORAL | 1 refills | Status: DC

## 2022-01-29 MED ORDER — EZETIMIBE 10 MG PO TABS: 10.0000 mg | ORAL_TABLET | Freq: Every day | ORAL | 1 refills | Status: DC

## 2022-01-29 MED ORDER — AMLODIPINE-OLMESARTAN 10-40 MG PO TABS: 1.0000 | ORAL_TABLET | Freq: Every day | ORAL | 1 refills | Status: DC

## 2022-01-30 ENCOUNTER — Other Ambulatory Visit: Payer: Self-pay

## 2022-01-30 ENCOUNTER — Other Ambulatory Visit: Payer: Self-pay | Admitting: *Deleted

## 2022-01-30 MED ORDER — CHLORTHALIDONE 25 MG PO TABS: 25.0000 mg | ORAL_TABLET | Freq: Every day | ORAL | 1 refills | Status: DC

## 2022-01-30 NOTE — Telephone Encounter (Signed)
Refills was sent to the pharmacy on 01/29/2022.

## 2022-01-31 ENCOUNTER — Ambulatory Visit: Payer: Managed Care, Other (non HMO) | Attending: Cardiology | Admitting: Pharmacist

## 2022-01-31 DIAGNOSIS — T466X5A Adverse effect of antihyperlipidemic and antiarteriosclerotic drugs, initial encounter: Secondary | ICD-10-CM | POA: Insufficient documentation

## 2022-01-31 DIAGNOSIS — T466X5D Adverse effect of antihyperlipidemic and antiarteriosclerotic drugs, subsequent encounter: Secondary | ICD-10-CM | POA: Diagnosis not present

## 2022-01-31 DIAGNOSIS — E782 Mixed hyperlipidemia: Secondary | ICD-10-CM

## 2022-01-31 DIAGNOSIS — G720 Drug-induced myopathy: Secondary | ICD-10-CM | POA: Diagnosis not present

## 2022-01-31 NOTE — Progress Notes (Signed)
Patient ID: Mario [name]                 DOB: 1960-04-26                    MRN: [id number]      HPI: LOPEZ DENTINGER is a 61 y.o. male patient referred to lipid clinic by Dr. Rockey Situ. PMH is significant for HTN, CAD s/p PTCA and stenting in 2004, stenting to the proximal left circumflex artery and obtuse marginal artery in 2019. Family hx of premature disease. Has taken statins previous and could hardly get out of bed. Has been on ezetimibe for several years. LDL-C 146. Was on Repatha, but hates sticking himself. Also would get large bruises.   Patient presents today to lipid clinic. He is open to trying a new medications. 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   Current Medications: ezetimibe '10mg'$  daily Intolerances: Statin myalgias, rosuvastatin '5mg'$  daily, atorvastatin, simvastatin Repatha: bruising/swelling at injection site Risk Factors: premature CAD, progressive CAD LDL goal: <55  Diet: not discussed  Exercise: Active at baseline limited by chronic right low back pain with occasional sciatica, active runs a landfill, stays busy  Family History:  Family History  Problem Relation Age of Onset   Hyperlipidemia Mother    Hypertension Mother    Heart disease Mother    Heart failure Mother        70   Lung cancer Mother    Leukemia Father    Hypertension Sister    Hypertension Sister    Coronary artery disease Other    Social History: quit smoking in 2018  Labs: 01/01/22 TC 240, TG 68, HDL 80, LDL-C 146 (ezetimibe '10mg'$  daily)  Past Medical History:  Diagnosis Date   Arthritis    CAD (coronary artery disease)    HLD (hyperlipidemia)    Recurrent chest pain    Sleep apnea    cpap    Current Outpatient Medications on File Prior to Visit  Medication Sig Dispense Refill   amLODipine-olmesartan (AZOR) 10-40 MG tablet Take 1 tablet by mouth daily. 90 tablet 1   aspirin 81 MG tablet Take 81 mg daily by mouth.      chlorthalidone (HYGROTON)  25 MG tablet Take 1 tablet (25 mg total) by mouth daily. 90 tablet 1   clopidogrel (PLAVIX) 75 MG tablet Take 1 tablet (75 mg total) by mouth daily. Must keep follow up appointment for additional refills. 90 tablet 1   ezetimibe (ZETIA) 10 MG tablet Take 1 tablet (10 mg total) by mouth daily. Must keep follow up appointment for additional refills. 90 tablet 1   nitroGLYCERIN (NITROSTAT) 0.4 MG SL tablet PLACE 1 TABLET (0.4 MG TOTAL) UNDER TONGUE EVERY 5 MINUTES FOR 3 DOSES AS NEEDED FOR CHEST PAIN 75 tablet 0   No current facility-administered medications on file prior to visit.    Allergies  Allergen Reactions   Atorvastatin Calcium Other (See Comments)   Rosuvastatin Calcium Other (See Comments)   Simvastatin Other (See Comments)    Joint pain   Crestor [Rosuvastatin Calcium]     Joint pain.  He is able to take a low dose every other day.    Lipitor [Atorvastatin Calcium]     Joint pain    Assessment/Plan:  1. Hyperlipidemia -   Hyperlipidemia Assessment: Intolerant to 3 statins Bruising with Repatha and does not like self injecting LDL-C way above goal on ezetimibe  alone High risk due to premature and progressive disease Open to injections given by someone else Discussed Leqvio  Plan:  Will submit benefits investigation request to [facility] service center to check affordability Continue ezetimibe '10mg'$  daily Will follow up with patient once more info about coverage of Marion Downer is know, patient signed start form.   Thank you,   Ramond Dial, Pharm.D, BCPS, CPP [facility] [address], [place], [place] [address]  Phone: [phone]; Fax: [phone]

## 2022-01-31 NOTE — Assessment & Plan Note (Signed)
Assessment:  Intolerant to 3 statins  Bruising with Repatha and does not like self injecting  LDL-C way above goal on ezetimibe alone  High risk due to premature and progressive disease  Open to injections given by someone else  Discussed Leqvio  Plan:   Will submit benefits investigation request to [facility] service center to check affordability  Continue ezetimibe '10mg'$  daily  Will follow up with patient once more info about coverage of Marion Downer is know, patient signed start form.

## 2022-02-11 ENCOUNTER — Telehealth: Payer: Self-pay | Admitting: Pharmacist

## 2022-02-11 NOTE — Telephone Encounter (Signed)
Heard back from [place]. Pt has 1500 deductible then would be responsible for 20% of cost. He has a $4000 OOP max. He can use copay card which can use up to $3000 at one time and $3600 per year.  PA is required. Will need to call [phone] to do PA.

## 2022-02-12 ENCOUNTER — Other Ambulatory Visit: Payer: Self-pay | Admitting: Pharmacist

## 2022-02-12 DIAGNOSIS — I2510 Atherosclerotic heart disease of native coronary artery without angina pectoris: Secondary | ICD-10-CM | POA: Insufficient documentation

## 2022-02-12 NOTE — Telephone Encounter (Signed)
[id number] Marion Downer approved through 02/12/22-02/11/23 Referral sent to [facility] infusion center who should get patient copay card to cover cost. Patient made aware that someone from the infusion center should be reaching out.

## 2022-02-14 ENCOUNTER — Telehealth: Payer: Self-pay | Admitting: Pharmacy Technician

## 2022-02-14 NOTE — Telephone Encounter (Addendum)
Mario [name] note:   Auth Submission: APPROVED Payer: [employer] Medication & CPT/J Code(s) submitted: Leqvio (Inclisiran) [phone] Route of submission (phone, fax, portal):  Phone [phone] Fax [phone] Auth type: Buy/Bill Units/visits requested: '284MG'$  X 2 DOSES Reference number:  Approval from:  to  at [place]   Benefits request has been submitted to [facility] service center via portal.  Co-pay card: APPROVED ID: [id number]

## 2022-02-15 NOTE — Telephone Encounter (Signed)
I did PA over the phone already- Case # [id number] Leqvio approved through 02/12/22-02/11/23 I can fax you the portal info- start form already submitted and returned.

## 2022-02-18 ENCOUNTER — Other Ambulatory Visit: Payer: Self-pay

## 2022-02-21 NOTE — Telephone Encounter (Signed)
Melissa,  Just to clarify, was [employer] info used (NPI and Tax). If not please call [employer] and update servicing provider to [facility] (NPI- [id number] and TAX [id number]) otherwise the claim will be denied.  I attempted to check and unfortunately was unable to verify due to HIPAA/privacy reasons. (Fyi-only the original requestor can update servicing provider info)

## 2022-02-22 ENCOUNTER — Encounter: Payer: Self-pay | Admitting: Cardiovascular Disease

## 2022-02-22 NOTE — Telephone Encounter (Signed)
Melissa, Thanks - awesome! We will schedule the patient.

## 2022-02-22 NOTE — Telephone Encounter (Signed)
Called Mario [name] and updated the servicing provider to [employer]  NPI- [id number] and TAX [id number]

## 2022-03-04 ENCOUNTER — Ambulatory Visit: Payer: Managed Care, Other (non HMO)

## 2022-03-04 VITALS — BP 141/93 | HR 61 | Temp 97.7°F | Resp 18 | Ht 69.0 in | Wt 240.2 lb

## 2022-03-04 DIAGNOSIS — E782 Mixed hyperlipidemia: Secondary | ICD-10-CM

## 2022-03-04 DIAGNOSIS — I2510 Atherosclerotic heart disease of native coronary artery without angina pectoris: Secondary | ICD-10-CM | POA: Diagnosis not present

## 2022-03-04 MED ORDER — EPINEPHRINE 0.3 MG/0.3ML IJ SOAJ: 0.3000 mg | Freq: Once | INTRAMUSCULAR | Status: DC | PRN

## 2022-03-04 MED ORDER — ALBUTEROL SULFATE HFA 108 (90 BASE) MCG/ACT IN AERS: 2.0000 | INHALATION_SPRAY | Freq: Once | RESPIRATORY_TRACT | Status: DC | PRN

## 2022-03-04 MED ORDER — SODIUM CHLORIDE 0.9 % IV SOLN: Freq: Once | INTRAVENOUS | Status: DC | PRN

## 2022-03-04 MED ORDER — METHYLPREDNISOLONE SODIUM SUCC 125 MG IJ SOLR: 125.0000 mg | Freq: Once | INTRAMUSCULAR | Status: DC | PRN

## 2022-03-04 MED ORDER — DIPHENHYDRAMINE HCL 50 MG/ML IJ SOLN: 50.0000 mg | Freq: Once | INTRAMUSCULAR | Status: DC | PRN

## 2022-03-04 MED ORDER — INCLISIRAN SODIUM 284 MG/1.5ML ~~LOC~~ SOSY
284.0000 mg | PREFILLED_SYRINGE | Freq: Once | SUBCUTANEOUS | Status: AC
  Administered 2022-03-04: 284 mg via SUBCUTANEOUS
  Filled 2022-03-04: qty 1.5

## 2022-03-04 MED ORDER — FAMOTIDINE IN NACL 20-0.9 MG/50ML-% IV SOLN: 20.0000 mg | Freq: Once | INTRAVENOUS | Status: DC | PRN

## 2022-03-04 NOTE — Progress Notes (Signed)
Diagnosis: Hypoalbuminemia  Provider:  Marshell Garfinkel MD  Procedure: Injection  Leqvio (inclisiran), Dose: 284 mg, Site: subcutaneous, Number of injections: 1  Post Care: observation period completed.  Discharge: Condition: Good, Destination: Home . AVS provided to patient.   Performed by:  Arnoldo Morale, RN

## 2022-04-03 ENCOUNTER — Telehealth: Payer: Self-pay | Admitting: Cardiovascular Disease

## 2022-04-03 NOTE — Telephone Encounter (Signed)
Obie Dredge [facility] calling because she needs Dr. Rockey Situ to sign copay form faxed over in media tab.

## 2022-04-03 NOTE — Telephone Encounter (Signed)
Forms printed and placed on MD desk to sign

## 2022-04-25 NOTE — Telephone Encounter (Signed)
Reviewed the patient's chart.  Looked on Dr. Donivan Scull desk and the paperwork has not been signed yet.  Message left for MD to please sign.

## 2022-05-02 ENCOUNTER — Other Ambulatory Visit: Payer: Self-pay

## 2022-05-02 NOTE — Telephone Encounter (Signed)
Forms have been signed and faxed back to [facility]. Placed fax in my "faxed folder"

## 2022-05-02 NOTE — Telephone Encounter (Signed)
Secure chat message sent over to pharmacy team for further instructions regarding forms that have been signed from provider.

## 2022-06-07 ENCOUNTER — Ambulatory Visit: Payer: Managed Care, Other (non HMO)

## 2022-06-11 ENCOUNTER — Ambulatory Visit: Payer: Managed Care, Other (non HMO)

## 2022-06-11 VITALS — BP 158/95 | HR 72 | Temp 97.9°F | Resp 18 | Ht 69.0 in | Wt 245.6 lb

## 2022-06-11 DIAGNOSIS — I2510 Atherosclerotic heart disease of native coronary artery without angina pectoris: Secondary | ICD-10-CM | POA: Diagnosis not present

## 2022-06-11 DIAGNOSIS — E782 Mixed hyperlipidemia: Secondary | ICD-10-CM | POA: Diagnosis not present

## 2022-06-11 MED ORDER — INCLISIRAN SODIUM 284 MG/1.5ML ~~LOC~~ SOSY
284.0000 mg | PREFILLED_SYRINGE | Freq: Once | SUBCUTANEOUS | Status: AC
  Administered 2022-06-11: 284 mg via SUBCUTANEOUS
  Filled 2022-06-11: qty 1.5

## 2022-06-11 NOTE — Progress Notes (Signed)
Diagnosis: Hyperlipidemia  Provider:  Marshell Garfinkel MD  Procedure: Injection  Leqvio (inclisiran), Dose: 284 mg, Site: subcutaneous, Number of injections: 1  Post Care:     Discharge: Condition: Good, Destination: Home . AVS Provided and AVS Declined  Performed by:  Arnoldo Morale, RN

## 2022-06-14 ENCOUNTER — Encounter: Payer: Self-pay | Admitting: Pharmacist

## 2022-06-28 ENCOUNTER — Ambulatory Visit (HOSPITAL_BASED_OUTPATIENT_CLINIC_OR_DEPARTMENT_OTHER): Payer: Managed Care, Other (non HMO) | Admitting: Internal Medicine

## 2022-07-01 ENCOUNTER — Telehealth: Payer: Self-pay | Admitting: Cardiovascular Disease

## 2022-07-01 DIAGNOSIS — I2510 Atherosclerotic heart disease of native coronary artery without angina pectoris: Secondary | ICD-10-CM

## 2022-07-01 DIAGNOSIS — E782 Mixed hyperlipidemia: Secondary | ICD-10-CM

## 2022-07-01 NOTE — Telephone Encounter (Signed)
  Pt c/o medication issue:  1. Name of Medication: Leqvio  2. How are you currently taking this medication (dosage and times per day)?   3. Are you having a reaction (difficulty breathing--STAT)?   4. What is your medication issue?  Cammie - Nurse Case Manager calling they would like to know if pt needs to continue taking this medication. If he does, she ask if pt can be sent to [facility] infusion, [id number], [place] [address] phone# [phone]

## 2022-07-03 NOTE — Telephone Encounter (Signed)
Cammie, nurse case manager, is returning call requesting call back for update. Please advise.

## 2022-07-03 NOTE — Telephone Encounter (Signed)
Spoke with patient. He will do labs at [facility]. Orders placed. He states his insurance says [facility] is out of network. States they can also send to MD office. I called Cammie and left VM. We can give in office if this is the way its covered best.

## 2022-07-03 NOTE — Telephone Encounter (Signed)
Spoke with Cammie. States his insurance now is part of [place] which is specific to medications. He can still see [facility] providers for apt/labs. Infusion center is out of network. Can either use infusion center in [place] or they can use [facility] and have medication shipped to [place].  Pt not due to next injection until August. She will reach back out in July to finalize plan.  Pt will get labs done to see how well the medication is working.

## 2022-07-17 ENCOUNTER — Other Ambulatory Visit
Admission: RE | Admit: 2022-07-17 | Discharge: 2022-07-17 | Disposition: A | Discharge status: Home / Self Care | Payer: Managed Care, Other (non HMO) | Attending: Cardiovascular Disease | Admitting: Cardiovascular Disease

## 2022-07-17 DIAGNOSIS — E782 Mixed hyperlipidemia: Secondary | ICD-10-CM | POA: Diagnosis present

## 2022-07-17 DIAGNOSIS — I2510 Atherosclerotic heart disease of native coronary artery without angina pectoris: Secondary | ICD-10-CM | POA: Diagnosis not present

## 2022-07-17 LAB — LIPID PANEL
Cholesterol: 207 mg/dL — ABNORMAL HIGH (ref 0–200)
HDL: 94 mg/dL (ref >40)
LDL Cholesterol: 100 mg/dL — ABNORMAL HIGH (ref 0–99)
Total CHOL/HDL Ratio: 2.2 RATIO
Triglycerides: 65 mg/dL (ref <150)
VLDL: 13 mg/dL (ref 0–40)

## 2022-07-22 ENCOUNTER — Telehealth: Payer: Self-pay | Admitting: Pharmacist

## 2022-07-22 MED ORDER — ROSUVASTATIN CALCIUM 5 MG PO TABS: 5.0000 mg | ORAL_TABLET | ORAL | 3 refills | Status: DC

## 2022-07-22 NOTE — Telephone Encounter (Signed)
I spoke with patient. He called back actually because he got a bill in the mail for ~ 1900 for his Leqvio injection. He has gotten 2 injections so far. This is the first time he has gotten a bill. I have reached out to the [facility] infusion center about this. Pt does not want to continue with Leqvio injections.  He does not want to do Repatha due to the bruising. He has not been taking zetia, which explains a little bit about the lack of response, but still would have expected better response. We did discuss trying rosuvastatin 5mg  twice a week. Pt was interested. Before we could finalize plan, patient mentioned that after he takes his amlodipine his face gets very warm and red. States that Dr. Rockey Situ changes his medications in Sept but has never had follow up. I advised this isnt a normal size effect but possible. He hasn't been checking BP. I asked him to decrease to amlodipine/olmesartan 1/2 tablet daily. He will see me in clinic 4/12 with his BP cuff and readings. We can finalize statin plan at that time too.

## 2022-07-22 NOTE — Telephone Encounter (Signed)
Improvement in LDL-C, down from 146. Not as large of a drop as expected. He has received 2 injections. Will need to confirm he has been taking zetia as well.  Previously on Repatha/Praluent- did not like self injections and it caused him bruising

## 2022-08-02 ENCOUNTER — Ambulatory Visit: Payer: Managed Care, Other (non HMO) | Attending: Internal Medicine | Admitting: Pharmacist

## 2022-08-02 VITALS — BP 152/80 | HR 85

## 2022-08-02 DIAGNOSIS — I10 Essential (primary) hypertension: Secondary | ICD-10-CM | POA: Diagnosis not present

## 2022-08-02 DIAGNOSIS — E782 Mixed hyperlipidemia: Secondary | ICD-10-CM | POA: Diagnosis not present

## 2022-08-02 MED ORDER — IRBESARTAN 300 MG PO TABS: 300.0000 mg | ORAL_TABLET | Freq: Every day | ORAL | 1 refills | Status: DC

## 2022-08-02 NOTE — Assessment & Plan Note (Signed)
Assessment: Patient does not wish to continue with Leqvio injections.  This is reasonable since it was not all that effective. Patient did start rosuvastatin 5 mg twice a week just recently and has resumed ezetimibe 10 mg daily I do not think that his knee pain is related to the Leqvio  Plan: Continue rosuvastatin 5 mg twice a week and ezetimibe 10 mg daily Recheck labs in 3 months

## 2022-08-02 NOTE — Assessment & Plan Note (Signed)
Assessment: Blood pressure elevated in clinic today above goal of less than 130/80 Patient states that his blood pressure has never been less than 130/80 Patient having flushing (warm and red sensation on his face).  Not fully convinced this is related to his medications, but patient feels as though this started after amlodipine was started.  There are multiple reasons why people could have facial flushing including alcohol intake or elevated blood pressure. Patient has been having headaches which are not normal for him.  He has been taking BC powder.  I warned him that this could be bad for his blood pressure given the caffeine Patient drinks a significant amount of Diet Coke a day and very little water.  He also drinks more than recommended alcohol daily.  We did discuss the impacts of this on his blood pressure time to cut back.  Patient is willing to cut back on diet Coke drinking only 1 with lunch and water all other times.  He does not wish at this time to cut back on beer.  Would like to first work on his diet Coke consumption. He does have an active job but no constructive exercise Feels as though he has gained 20 pounds over the last 2 years although he does not eat frequently  Plan: Stop amlodipine/olmesartan to see if flushing is being caused by amlodipine Start irbesartan 300 mg daily Continue chlorthalidone 25 mg daily Start checking blood pressure at home and bring readings and machine to next visit Patient educated on proper technique to check blood pressure- stressing to rest 5 minutes Cut back on caffeine and alcohol Start walking at a moderate pace for 5 to 10 minutes or more daily.  Patient was educated on how this can have a positive effect on his blood pressure Follow-up in 2 weeks

## 2022-08-02 NOTE — Patient Instructions (Addendum)
Summary of today's discussion  STOP taking omelsartan/amlodipine  2. START taking irbesartan 300mg  daily  3. Continue chlorthalidone 25mg  daily  4. Cut back on diet coke and beer  5. Start checking blood pressure once a day. Please bring a list of your readings and your blood pressure medication with you to your next visit.   Your blood pressure goal is <130/80  To check your pressure at home you will need to:  1. Sit up in a chair, with feet flat on the floor and back supported. Do not cross your ankles or legs. 2. Rest your left arm so that the cuff is about heart level. If the cuff goes on your upper arm,  then just relax the arm on the table, arm of the chair or your lap. If you have a wrist cuff, we  suggest relaxing your wrist against your chest (think of it as Pledging the Flag with the  wrong arm).  3. Place the cuff snugly around your arm, about 1 inch above the crook of your elbow. The  cords should be inside the groove of your elbow.  4. Sit quietly, with the cuff in place, for about 5 minutes. After that 5 minutes press the power  button to start a reading. 5. Do not talk or move while the reading is taking place.  6. Record your readings on a sheet of paper. Although most cuffs have a memory, it is often  easier to see a pattern developing when the numbers are all in front of you.  7. You can repeat the reading after 1-3 minutes if it is recommended  Make sure your bladder is empty and you have not had caffeine or tobacco within the last 30 min  Always bring your blood pressure log with you to your appointments. If you have not brought your monitor in to be double checked for accuracy, please bring it to your next appointment.  You can find a list of validated (accurate) blood pressure cuffs at [email or web address]   Important lifestyle changes to control high blood pressure  Intervention  Effect on the BP  Lose extra pounds and watch your waistline Weight loss is one of  the most effective lifestyle changes for controlling blood pressure. If you're overweight or obese, losing even a small amount of weight can help reduce blood pressure. Blood pressure might go down by about 1 millimeter of mercury (mm Hg) with each kilogram (about 2.2 pounds) of weight lost.  Exercise regularly As a general goal, aim for at least 30 minutes of moderate physical activity every day. Regular physical activity can lower high blood pressure by about 5 to 8 mm Hg.  Eat a healthy diet Eating a diet rich in whole grains, fruits, vegetables, and low-fat dairy products and low in saturated fat and cholesterol. A healthy diet can lower high blood pressure by up to 11 mm Hg.  Reduce salt (sodium) in your diet Even a small reduction of sodium in the diet can improve heart health and reduce high blood pressure by about 5 to 6 mm Hg.  Limit alcohol One drink equals 12 ounces of beer, 5 ounces of wine, or 1.5 ounces of 80-proof liquor.  Limiting alcohol to less than one drink a day for women or two drinks a day for men can help lower blood pressure by about 4 mm Hg.   Please call me at [phone] with any questions.

## 2022-08-02 NOTE — Progress Notes (Signed)
Patient ID: COUGAR IMEL                 DOB: October 01, 1960                    MRN: [id number]      HPI: Mario [name] is a 62 y.o. male patient referred to HTN clinic by Dr. Mariah Milling. PMH is significant for HTN, CAD s/p PTCA and stenting in 2004, stenting to the proximal left circumflex artery and obtuse marginal artery in 2019. Family hx of premature disease. Has taken statins previous and could hardly get out of bed. Has been on ezetimibe for several years. LDL-C 146. Was on Repatha, but hates sticking himself. Also would get large bruises. He was recently tried on Leqvio, but response was subpar.   When reviewing lipid labs, patient mentioned that his blood pressure medications had been changed in Sept. Ever since then, he his face gets warm and red after taking amlodipine. I asked him to decrease his amlodipine/omlesartan to 1/2 tablet per day and start checking blood pressure  Patient presents today for follow-up.  He does not want to take the Leqvio injections anymore.  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  He started taking rosuvastatin twice a week this week.  Patient reports that last week he had a headache for 4 weeks.  Some dizziness that he attributes to sinuses and the pollen.  His face is still very red and warm despite decreasing the olmesartan/amlodipine dose.  Blood pressure in clinic today 150/78 which patient reports is good for him.  Denies any blurred vision.  He drinks a significant amount of Diet Coke per day.  Up to 228 ounces per day.  Very little water.  Also drinks about 4-5 beers per day.  He states that since his medications were changed she is felt poorly.  He states that recently his knees have been achy.  Denies any previous issues with knee pain.  This started prior to starting his statin.   Current Lipid Medications: ezetimibe  daily, rosuvastatin  twice a week Current HTN  medications: olmesartan/amlodipine 20/5mg  daily, chlorthalidone  daily Previous HTN medications: olmesartan/amlodipine 40/10mg  daily (flushing, red/warm face), benazepril, losartan, irbesartan, valsartan, HCTZ Intolerances: Statin myalgias, rosuvastatin  daily, atorvastatin, simvastatin Repatha: bruising/swelling at injection site, Leqvio- ineffective Risk Factors: premature CAD, progressive CAD LDL goal: <55  Diet: 6-7 16 oz bottles per day, very little water Sausage biscuit  Steak, chicken, fish w/ salad w/ ranch Doesn't salt food  Exercise: Active at baseline limited by chronic right low back pain with occasional sciatica, active runs a landfill, stays busy  Family History:  Family History  Problem Relation Age of Onset   Hyperlipidemia Mother    Hypertension Mother    Heart disease Mother    Heart failure Mother        69   Lung cancer Mother    Leukemia Father    Hypertension Sister    Hypertension Sister    Coronary artery disease Other    Social History: quit smoking in 2018  Labs: 01/01/22 TC 240, TG 68, HDL 80, LDL-C 146 (ezetimibe  daily)  Past Medical History:  Diagnosis Date   Arthritis    CAD (coronary artery disease)    HLD (hyperlipidemia)    Recurrent chest pain    Sleep apnea    cpap  Current Outpatient Medications on File Prior to Visit  Medication Sig Dispense Refill   aspirin 81 MG tablet Take 81 mg daily by mouth.      chlorthalidone (HYGROTON) 25 MG tablet Take 1 tablet (25 mg total) by mouth daily. 90 tablet 1   clopidogrel (PLAVIX) 75 MG tablet Take 1 tablet (75 mg total) by mouth daily. Must keep follow up appointment for additional refills. 90 tablet 1   ezetimibe (ZETIA) 10 MG tablet Take 1 tablet (10 mg total) by mouth daily. Must keep follow up appointment for additional refills. 90 tablet 1   inclisiran (LEQVIO) 284 MG/1.5ML SOSY injection Inject 1.18ml every 6 months 1.5 mL 0   nitroGLYCERIN (NITROSTAT) 0.4 MG SL tablet PLACE  1 TABLET (0.4 MG TOTAL) UNDER TONGUE EVERY 5 MINUTES FOR 3 DOSES AS NEEDED FOR CHEST PAIN 75 tablet 0   rosuvastatin (CRESTOR) 5 MG tablet Take 1 tablet (5 mg total) by mouth 2 (two) times a week. 24 tablet 3   No current facility-administered medications on file prior to visit.    Allergies  Allergen Reactions   Atorvastatin Calcium Other (See Comments)   Rosuvastatin Calcium Other (See Comments)   Simvastatin Other (See Comments)    Joint pain   Crestor [Rosuvastatin Calcium]     Joint pain.  He is able to take a low dose every other day.    Lipitor [Atorvastatin Calcium]     Joint pain    Assessment/Plan:  1. Hyperlipidemia -   Hyperlipidemia Assessment: Patient does not wish to continue with Leqvio injections.  This is reasonable since it was not all that effective. Patient did start rosuvastatin 5 mg twice a week just recently and has resumed ezetimibe 10 mg daily I do not think that his knee pain is related to the Leqvio  Plan: Continue rosuvastatin 5 mg twice a week and ezetimibe 10 mg daily Recheck labs in 3 months  HTN (hypertension) Assessment: Blood pressure elevated in clinic today above goal of less than 130/80 Patient states that his blood pressure has never been less than 130/80 Patient having flushing (warm and red sensation on his face).  Not fully convinced this is related to his medications, but patient feels as though this started after amlodipine was started.  There are multiple reasons why people could have facial flushing including alcohol intake or elevated blood pressure. Patient has been having headaches which are not normal for him.  He has been taking BC powder.  I warned him that this could be bad for his blood pressure given the caffeine Patient drinks a significant amount of Diet Coke a day and very little water.  He also drinks more than recommended alcohol daily.  We did discuss the impacts of this on his blood pressure time to cut back.  Patient  is willing to cut back on diet Coke drinking only 1 with lunch and water all other times.  He does not wish at this time to cut back on beer.  Would like to first work on his diet Coke consumption. He does have an active job but no constructive exercise Feels as though he has gained 20 pounds over the last 2 years although he does not eat frequently  Plan: Stop amlodipine/olmesartan to see if flushing is being caused by amlodipine Start irbesartan 300 mg daily Continue chlorthalidone 25 mg daily Start checking blood pressure at home and bring readings and machine to next visit Patient educated on proper technique to check blood  pressure- stressing to rest 5 minutes Cut back on caffeine and alcohol Start walking at a moderate pace for 5 to 10 minutes or more daily.  Patient was educated on how this can have a positive effect on his blood pressure Follow-up in 2 weeks    Thank you,   Olene Floss, Pharm.D, BCPS, CPP [facility] A Division of [facility] [address] [place], [place] [address]  Phone: [phone]; Fax: [phone]

## 2022-08-16 ENCOUNTER — Ambulatory Visit: Payer: Managed Care, Other (non HMO)

## 2022-09-05 ENCOUNTER — Ambulatory Visit: Payer: Managed Care, Other (non HMO)

## 2022-09-13 ENCOUNTER — Encounter: Payer: Self-pay | Admitting: Pharmacist

## 2022-09-13 ENCOUNTER — Ambulatory Visit: Payer: Managed Care, Other (non HMO) | Attending: Cardiovascular Disease | Admitting: Pharmacist

## 2022-09-13 VITALS — BP 154/90 | HR 74

## 2022-09-13 DIAGNOSIS — I10 Essential (primary) hypertension: Secondary | ICD-10-CM

## 2022-09-13 DIAGNOSIS — I2510 Atherosclerotic heart disease of native coronary artery without angina pectoris: Secondary | ICD-10-CM | POA: Diagnosis not present

## 2022-09-13 LAB — BASIC METABOLIC PANEL
BUN/Creatinine Ratio: 11 (ref 9–20)
BUN: 12 mg/dL (ref 6–24)
CO2: 26 mmol/L (ref 20–29)
Calcium: 10.1 mg/dL (ref 8.7–10.2)
Chloride: 97 mmol/L (ref 96–106)
Creatinine, Ser: 1.08 mg/dL (ref 0.76–1.27)
Glucose: 95 mg/dL (ref 70–99)
Potassium: 4 mmol/L (ref 3.5–5.2)
Sodium: 133 mmol/L — ABNORMAL LOW (ref 134–144)
eGFR: 79 mL/min/{1.73_m2} (ref >59)

## 2022-09-13 MED ORDER — AMLODIPINE BESYLATE 2.5 MG PO TABS: 2.5000 mg | ORAL_TABLET | Freq: Every day | ORAL | 3 refills | Status: DC

## 2022-09-13 NOTE — Progress Notes (Signed)
Patient ID: Mario MICHALCZYK                 DOB: 1961-03-11                    MRN: [id number]      HPI: Mario [name] is a 62 y.o. male patient referred to HTN clinic by Dr. Mariah Milling. PMH is significant for HTN, CAD s/p PTCA and stenting in 2004, stenting to the proximal left circumflex artery and obtuse marginal artery in 2019. Family hx of premature disease. Has taken statins previous and could hardly get out of bed. Has been on ezetimibe for several years. LDL-C 146. Was on Repatha, but hates sticking himself. Also would get large bruises. He was recently tried on Leqvio, but response was subpar.   When reviewing lipid labs, patient mentioned that his blood pressure medications had been changed in Sept. Ever since then, he his face gets warm and red after taking amlodipine. I asked him to decrease his amlodipine/omlesartan to 1/2 tablet per day and start checking blood pressure.   Saw patient on 4/12. Was still having issues with his face being warm and red. BP was up. He was drinking a lot of coke and beer. Had resuming rosuvastatin. Amlodipine/olmesartan was stopped and irbesartan 300mg  was started. He was asked to cut back on caffeine and alcohol.  Patient presents today for follow-up.  He brings in his home wrist cuff in which he is using it correctly.  Even when wrist cuff is used at heart level reading was inaccurate.  He states that he still has some flushing but it is much better and only last a short period of time.  He has cut back on caffeine drinking only 3 to 416 ounces of Diet Coke per day instead of 7-8.  Still drinking 4-5 beers per day and not willing to cut back.  He states that he does not eat much at all about 1 meal per day and continues to gain weight.  States that all of his siblings have issues with her thyroid and is interested in getting his checked. States he has not had a headache in about a week.  He has been on vacation at [employer] for the last week.  States that  his job is stressful.  Current Lipid Medications: ezetimibe 10mg  daily, rosuvastatin 5mg  twice a week Current HTN medications: irbesartan 300mg  daily, chlorthalidone 25mg  daily Previous HTN medications: olmesartan/amlodipine 40/10mg  daily (flushing, red/warm face), benazepril, losartan, irbesartan, valsartan, HCTZ Intolerances: Statin myalgias, rosuvastatin 5mg  daily, atorvastatin, simvastatin Repatha: bruising/swelling at injection site, Leqvio- ineffective Risk Factors: premature CAD, progressive CAD LDL goal: <55  Diet: 3-4 16 oz bottles per day, very little water Steak, chicken, fish w/ salad w/ ranch Doesn't salt food  Exercise: Active at baseline limited by chronic right low back pain with occasional sciatica, active runs a landfill, stays busy  Family History:  Family History  Problem Relation Age of Onset   Hyperlipidemia Mother    Hypertension Mother    Heart disease Mother    Heart failure Mother        50   Lung cancer Mother    Leukemia Father    Hypertension Sister    Hypertension Sister    Coronary artery disease Other    Social History: quit smoking in 2018  Labs: 01/01/22 TC 240, TG 68, HDL 80, LDL-C 146 (ezetimibe 10mg  daily)  Past Medical History:  Diagnosis Date  Arthritis    CAD (coronary artery disease)    HLD (hyperlipidemia)    Recurrent chest pain    Sleep apnea    cpap    Current Outpatient Medications on File Prior to Visit  Medication Sig Dispense Refill   aspirin 81 MG tablet Take 81 mg daily by mouth.      chlorthalidone (HYGROTON) 25 MG tablet Take 1 tablet (25 mg total) by mouth daily. 90 tablet 1   clopidogrel (PLAVIX) 75 MG tablet Take 1 tablet (75 mg total) by mouth daily. Must keep follow up appointment for additional refills. 90 tablet 1   ezetimibe (ZETIA) 10 MG tablet Take 1 tablet (10 mg total) by mouth daily. Must keep follow up appointment for additional refills. 90 tablet 1   irbesartan (AVAPRO) 300 MG tablet Take 1 tablet  (300 mg total) by mouth daily. 90 tablet 1   nitroGLYCERIN (NITROSTAT) 0.4 MG SL tablet PLACE 1 TABLET (0.4 MG TOTAL) UNDER TONGUE EVERY 5 MINUTES FOR 3 DOSES AS NEEDED FOR CHEST PAIN 75 tablet 0   rosuvastatin (CRESTOR) 5 MG tablet Take 1 tablet (5 mg total) by mouth 2 (two) times a week. 24 tablet 3   No current facility-administered medications on file prior to visit.    Allergies  Allergen Reactions   Atorvastatin Calcium Other (See Comments)   Rosuvastatin Calcium Other (See Comments)   Simvastatin Other (See Comments)    Joint pain   Crestor [Rosuvastatin Calcium]     Joint pain.  He is able to take a low dose every other day.    Lipitor [Atorvastatin Calcium]     Joint pain    Assessment/Plan:  1. Hyperlipidemia -   HTN (hypertension) Assessment: Blood pressure not well-controlled Explained to patient that just because his blood pressure has always been high does not mean that we should not try to get it down Previous lab showed low sodium.  Would like to recheck labs today We discussed retrial of amlodipine starting at 2.5 mg versus spironolactone if his labs were sufficient.  Other option would be hydralazine I have ordered a stat BMP that hopefully will come back before the long holiday weekend  Plan: Awaiting stat BMP.  If sodium and potassium and serum creatinine look good then we can start spironolactone     Thank you,   Olene Floss, Pharm.D, BCPS, CPP [facility] A Division of [facility] [address], [place], [place] [address]  Phone: [phone]; Fax: [phone]

## 2022-09-13 NOTE — Assessment & Plan Note (Signed)
Assessment: Blood pressure not well-controlled Explained to patient that just because his blood pressure has always been high does not mean that we should not try to get it down Previous lab showed low sodium.  Would like to recheck labs today We discussed retrial of amlodipine starting at 2.5 mg versus spironolactone if his labs were sufficient.  Other option would be hydralazine I have ordered a stat BMP that hopefully will come back before the long holiday weekend  Plan: Awaiting stat BMP.  If sodium and potassium and serum creatinine look good then we can start spironolactone

## 2022-09-13 NOTE — Patient Instructions (Signed)
Summary of today's discussion  1.Please pick up a new BP cuff- upper arm  2.Check blood pressure at home daily  3. Continue irbesartan 300mg  daily, chlorthalidone 25mg  daily  4. Increase physical activity  5.   Your blood pressure goal is <130/80  To check your pressure at home you will need to:  1. Sit up in a chair, with feet flat on the floor and back supported. Do not cross your ankles or legs. 2. Rest your left arm so that the cuff is about heart level. If the cuff goes on your upper arm,  then just relax the arm on the table, arm of the chair or your lap. If you have a wrist cuff, we  suggest relaxing your wrist against your chest (think of it as Pledging the Flag with the  wrong arm).  3. Place the cuff snugly around your arm, about 1 inch above the crook of your elbow. The  cords should be inside the groove of your elbow.  4. Sit quietly, with the cuff in place, for about 5 minutes. After that 5 minutes press the power  button to start a reading. 5. Do not talk or move while the reading is taking place.  6. Record your readings on a sheet of paper. Although most cuffs have a memory, it is often  easier to see a pattern developing when the numbers are all in front of you.  7. You can repeat the reading after 1-3 minutes if it is recommended  Make sure your bladder is empty and you have not had caffeine or tobacco within the last 30 min  Always bring your blood pressure log with you to your appointments. If you have not brought your monitor in to be double checked for accuracy, please bring it to your next appointment.  You can find a list of validated (accurate) blood pressure cuffs at [email or web address].no   Important lifestyle changes to control high blood pressure  Intervention  Effect on the BP  Lose extra pounds and watch your waistline Weight loss is one of the most effective lifestyle changes for controlling blood pressure. If you're overweight or obese, losing even a  small amount of weight can help reduce blood pressure. Blood pressure might go down by about 1 millimeter of mercury (mm Hg) with each kilogram (about 2.2 pounds) of weight lost.  Exercise regularly As a general goal, aim for at least 30 minutes of moderate physical activity every day. Regular physical activity can lower high blood pressure by about 5 to 8 mm Hg.  Eat a healthy diet Eating a diet rich in whole grains, fruits, vegetables, and low-fat dairy products and low in saturated fat and cholesterol. A healthy diet can lower high blood pressure by up to 11 mm Hg.  Reduce salt (sodium) in your diet Even a small reduction of sodium in the diet can improve heart health and reduce high blood pressure by about 5 to 6 mm Hg.  Limit alcohol One drink equals 12 ounces of beer, 5 ounces of wine, or 1.5 ounces of 80-proof liquor.  Limiting alcohol to less than one drink a day for women or two drinks a day for men can help lower blood pressure by about 4 mm Hg.   Please call me at [phone] with any questions.

## 2022-09-14 LAB — TSH: TSH: 0.848 u[IU]/mL (ref 0.450–4.500)

## 2022-09-14 LAB — T4, FREE: Free T4: 1.11 ng/dL (ref 0.82–1.77)

## 2022-09-17 ENCOUNTER — Telehealth: Payer: Self-pay | Admitting: Pharmacist

## 2022-09-17 NOTE — Telephone Encounter (Addendum)
Called pt to review labs. TSH/T4 normal.  Na slightly low. Will leave chlorthalidone for now, but will not add spironolactone due to low Na. On amlodipine previously. Thought to cause flushing. But still having some flushing off on amlodipine. Will start amlodipine 2.5mg  daily. Pt picked up yesterday. Reviewed labs with patient. Follow up 6/17. Pt to get new cuff. Will consider hydralazine of amlodipine ineffective.

## 2022-10-07 ENCOUNTER — Ambulatory Visit: Payer: Managed Care, Other (non HMO)

## 2022-10-07 NOTE — Progress Notes (Deleted)
Patient ID: Mario [name]                 DOB: 1960/10/05                    MRN: [id number]      HPI: Mario [name] is a 62 y.o. male patient referred to HTN clinic by Dr. Mariah Milling. PMH is significant for HTN, CAD s/p PTCA and stenting in 2004, stenting to the proximal left circumflex artery and obtuse marginal artery in 2019. Family hx of premature disease. Has taken statins previous and could hardly get out of bed. Has been on ezetimibe for several years. LDL-C 146. Was on Repatha, but hates sticking himself. Also would get large bruises. He was recently tried on Leqvio, but response was subpar.   When reviewing lipid labs, patient mentioned that his blood pressure medications had been changed in Sept. Ever since then, he his face gets warm and red after taking amlodipine. I asked him to decrease his amlodipine/omlesartan to 1/2 tablet per day and start checking blood pressure.   Saw patient on 4/12. Was still having issues with his face being warm and red. BP was up. He was drinking a lot of coke and beer. Had resuming rosuvastatin. Amlodipine/olmesartan was stopped and irbesartan 300mg  was started. He was asked to cut back on caffeine and alcohol. Agreed to cut back on caffeine but not alcohol.  Was last seen 09/13/22. His wrist cuff was found to be inaccurate. He was asked to buy an upper arm cuff. He had cut back on diet coke to 3-4 16oz bottles per day. TSH was normal. Na slightly low so spironolactone was not added. Amlodipine 2.5mg  daily was added.  New cuff? Home readings? Dizziness, lightheadedness, headache, blurred vision, SOB, swelling Hydralazie Beer- weight  Patient presents today for follow-up.  He brings in his home wrist cuff in which he is using it correctly.  Even when wrist cuff is used at heart level reading was inaccurate.  He states that he still has some flushing but it is much better and only last a short period of time.  He has cut back on caffeine drinking only 3 to  416 ounces of Diet Coke per day instead of 7-8.  Still drinking 4-5 beers per day and not willing to cut back.  He states that he does not eat much at all about 1 meal per day and continues to gain weight.  States that all of his siblings have issues with her thyroid and is interested in getting his checked. States he has not had a headache in about a week.  He has been on vacation at [employer] for the last week.  States that his job is stressful.  Current Lipid Medications: ezetimibe 10mg  daily, rosuvastatin 5mg  twice a week Current HTN medications: irbesartan 300mg  daily, chlorthalidone 25mg  daily, amlodipine 2.5mg  daily Previous HTN medications: olmesartan/amlodipine 40/10mg  daily (flushing, red/warm face), benazepril, losartan, irbesartan, valsartan, HCTZ Intolerances: Statin myalgias, rosuvastatin 5mg  daily, atorvastatin, simvastatin Repatha: bruising/swelling at injection site, Leqvio- ineffective Risk Factors: premature CAD, progressive CAD LDL goal: <55  Diet: 3-4 16 oz bottles per day, very little water Steak, chicken, fish w/ salad w/ ranch Doesn't salt food  Exercise: Active at baseline limited by chronic right low back pain with occasional sciatica, active runs a landfill, stays busy  Family History:  Family History  Problem Relation Age of Onset   Hyperlipidemia Mother    Hypertension Mother  Heart disease Mother    Heart failure Mother        12   Lung cancer Mother    Leukemia Father    Hypertension Sister    Hypertension Sister    Coronary artery disease Other    Social History: quit smoking in 2018  Labs: 01/01/22 TC 240, TG 68, HDL 80, LDL-C 146 (ezetimibe 10mg  daily)  Past Medical History:  Diagnosis Date   Arthritis    CAD (coronary artery disease)    HLD (hyperlipidemia)    Recurrent chest pain    Sleep apnea    cpap    Current Outpatient Medications on File Prior to Visit  Medication Sig Dispense Refill   amLODipine (NORVASC) 2.5 MG tablet  Take 1 tablet (2.5 mg total) by mouth daily. 90 tablet 3   aspirin 81 MG tablet Take 81 mg daily by mouth.      chlorthalidone (HYGROTON) 25 MG tablet Take 1 tablet (25 mg total) by mouth daily. 90 tablet 1   clopidogrel (PLAVIX) 75 MG tablet Take 1 tablet (75 mg total) by mouth daily. Must keep follow up appointment for additional refills. 90 tablet 1   ezetimibe (ZETIA) 10 MG tablet Take 1 tablet (10 mg total) by mouth daily. Must keep follow up appointment for additional refills. 90 tablet 1   irbesartan (AVAPRO) 300 MG tablet Take 1 tablet (300 mg total) by mouth daily. 90 tablet 1   nitroGLYCERIN (NITROSTAT) 0.4 MG SL tablet PLACE 1 TABLET (0.4 MG TOTAL) UNDER TONGUE EVERY 5 MINUTES FOR 3 DOSES AS NEEDED FOR CHEST PAIN 75 tablet 0   rosuvastatin (CRESTOR) 5 MG tablet Take 1 tablet (5 mg total) by mouth 2 (two) times a week. 24 tablet 3   No current facility-administered medications on file prior to visit.    Allergies  Allergen Reactions   Atorvastatin Calcium Other (See Comments)   Rosuvastatin Calcium Other (See Comments)   Simvastatin Other (See Comments)    Joint pain   Crestor [Rosuvastatin Calcium]     Joint pain.  He is able to take a low dose every other day.    Lipitor [Atorvastatin Calcium]     Joint pain    Assessment/Plan:  1. Hyperlipidemia -   No problem-specific Assessment & Plan notes found for this encounter.      Thank you,   Olene Floss, Pharm.D, BCPS, CPP [facility] A Division of [facility] [address], [place], [place] [address]  Phone: [phone]; Fax: [phone]

## 2022-11-25 ENCOUNTER — Other Ambulatory Visit: Payer: Self-pay

## 2022-11-25 MED ORDER — IRBESARTAN 300 MG PO TABS: 300.0000 mg | ORAL_TABLET | Freq: Every day | ORAL | 0 refills | Status: DC

## 2022-11-25 MED ORDER — ROSUVASTATIN CALCIUM 5 MG PO TABS: 5.0000 mg | ORAL_TABLET | ORAL | 0 refills | Status: DC

## 2022-11-25 MED ORDER — EZETIMIBE 10 MG PO TABS: 10.0000 mg | ORAL_TABLET | Freq: Every day | ORAL | 0 refills | Status: DC

## 2022-11-25 MED ORDER — CLOPIDOGREL BISULFATE 75 MG PO TABS: 75.0000 mg | ORAL_TABLET | Freq: Every day | ORAL | 0 refills | Status: DC

## 2022-11-25 MED ORDER — CHLORTHALIDONE 25 MG PO TABS: 25.0000 mg | ORAL_TABLET | Freq: Every day | ORAL | 0 refills | Status: DC

## 2022-11-25 NOTE — Telephone Encounter (Signed)
Refill sent for the following medications to [facility].  hlorthalidone (HYGROTON) 25 MG tablet  clopidogrel (PLAVIX) 75 MG tablet  ezetimibe (ZETIA) 10 MG tablet  irbesartan (AVAPRO) 300 MG tablet  rosuvastatin (CRESTOR) 5 MG tablet

## 2022-12-08 IMAGING — CT CT HEAD W/O CM
3 of 4 series · 13 of 47 positions shown, 15 images · non-contrast
Comparison: 12/31/2011

CLINICAL DATA: Struck in head by limb while cutting a tree on
[REDACTED], loss of consciousness, off and on headaches and frontal
region, night sweats

EXAM:
CT HEAD WITHOUT CONTRAST
TECHNIQUE: Contiguous axial images were obtained from the base of the skull
through the vertex without intravenous contrast. Sagittal and
coronal MPR images reconstructed from axial data set.

[Series 2: axial st head 5.00 ax · axial · 0.33mm/px · z∈[-476,-366]mm · 7 of 36 slices shown, 9 images]
[im 5/36  brain]
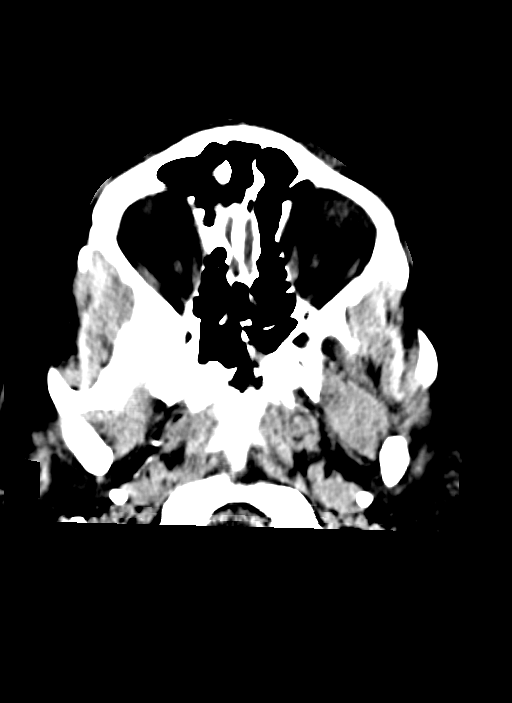
[im 5/36  bone]
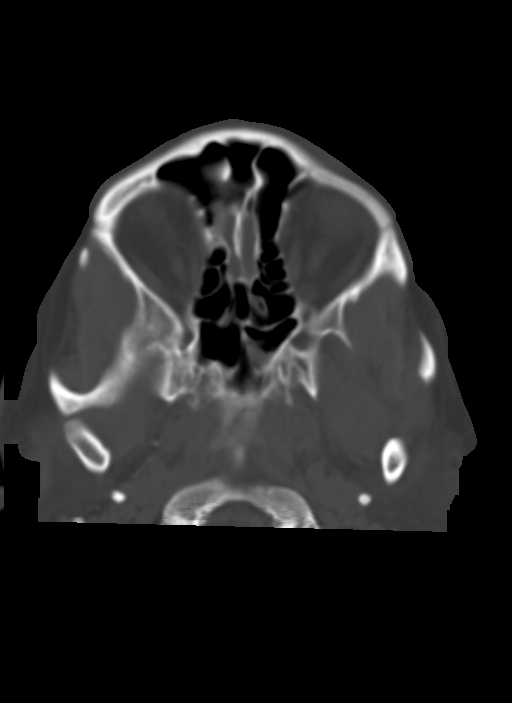
[im 9/36  brain]
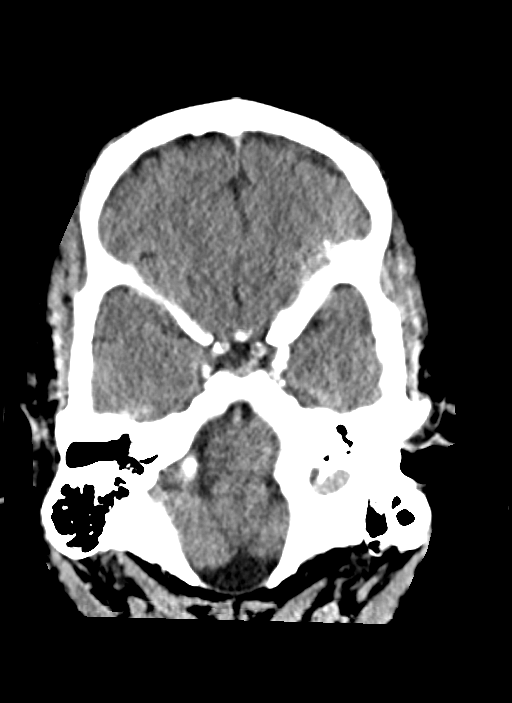
[im 14/36  brain]
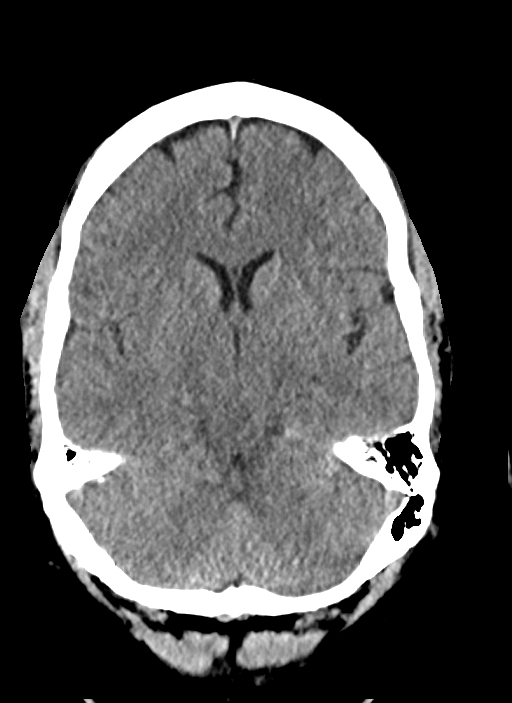
[im 18/36  brain]
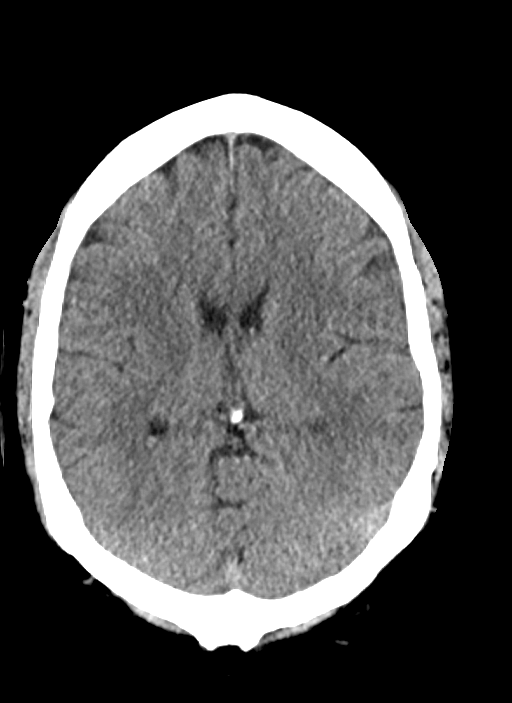
[im 22/36  brain]
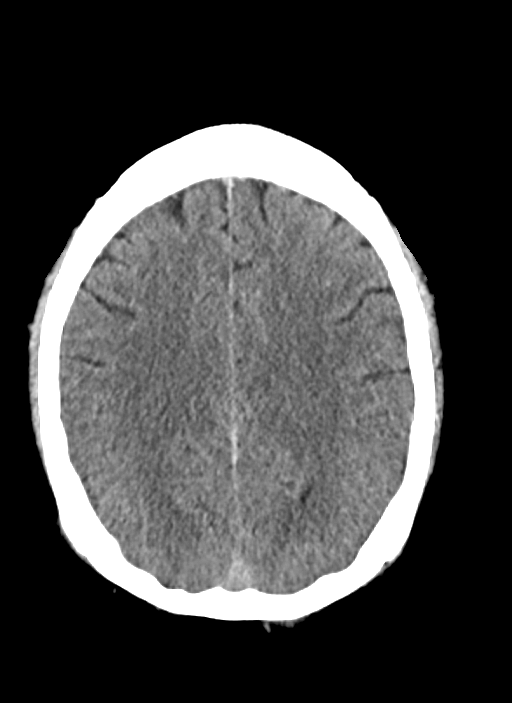
[im 22/36  bone]
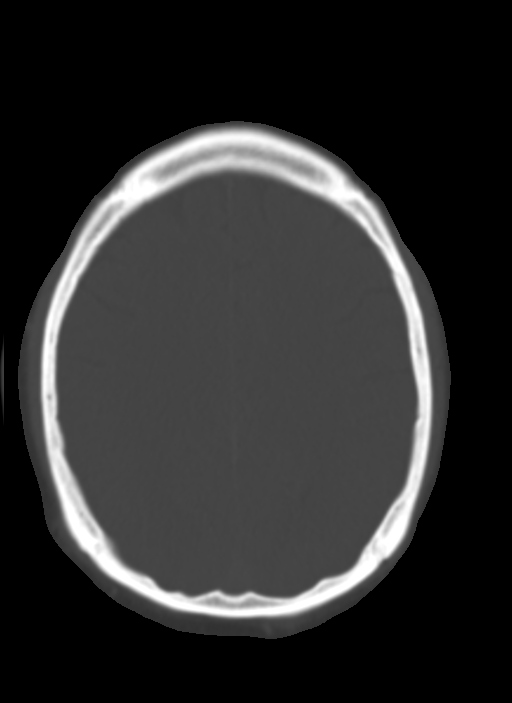
[im 27/36  brain]
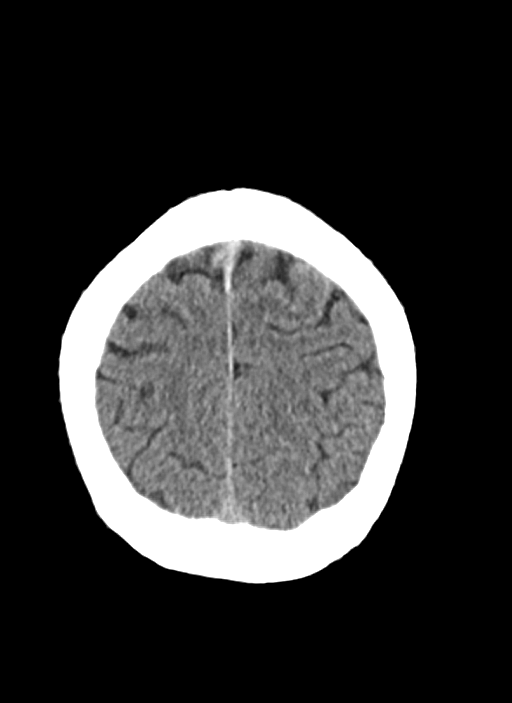
[im 31/36  brain]
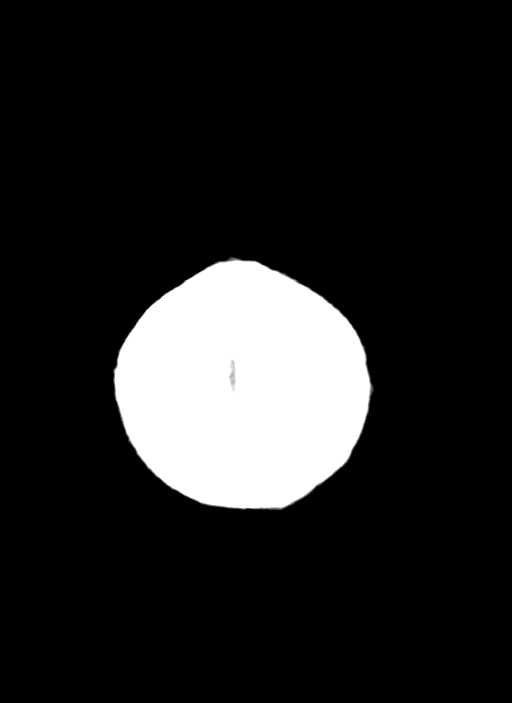

[Series 6: coronals head 3.00 cor · coronal · 0.33mm/px · 3 of 78 slices shown]
[im 31/78  brain]
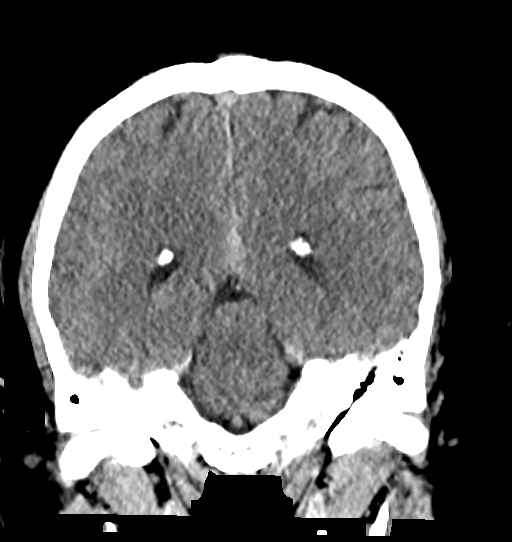
[im 36/78  brain]
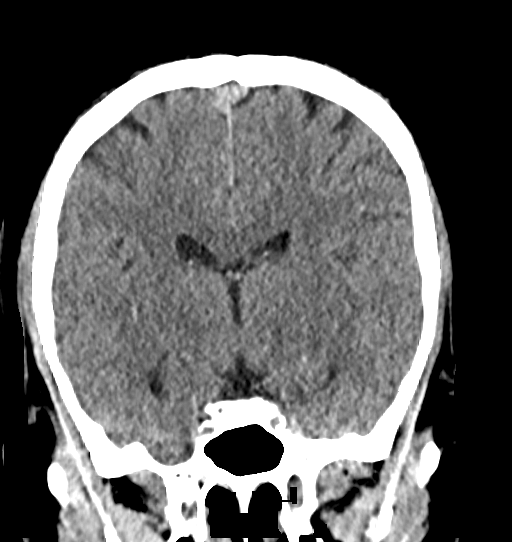
[im 42/78  brain]
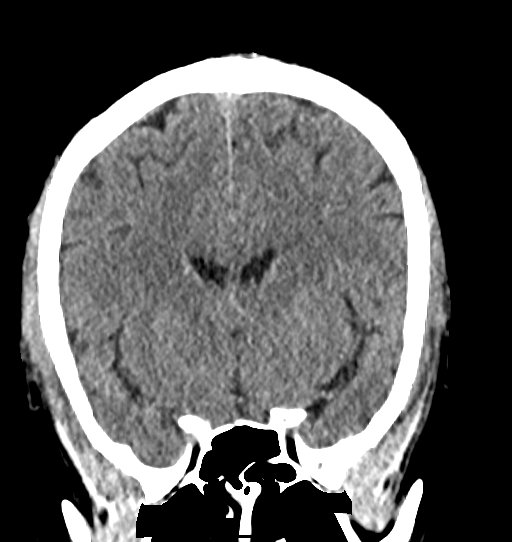

[Series 8: sagittals head 3.00 sag · sagittal · 0.35mm/px · 3 of 57 slices shown]
[im 19/57  brain]
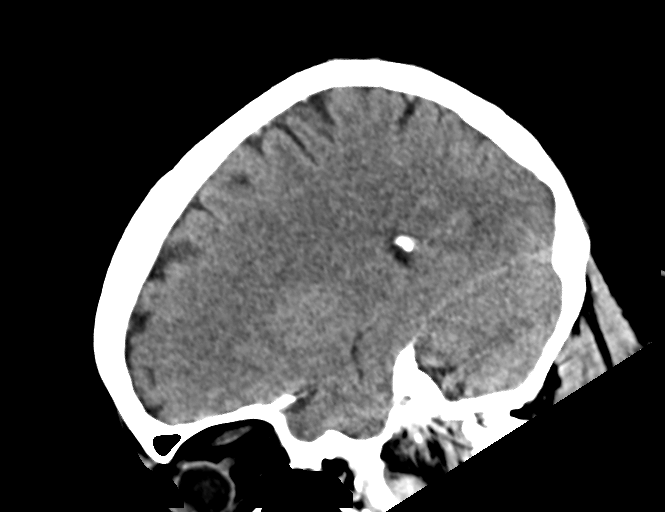
[im 29/57  brain]
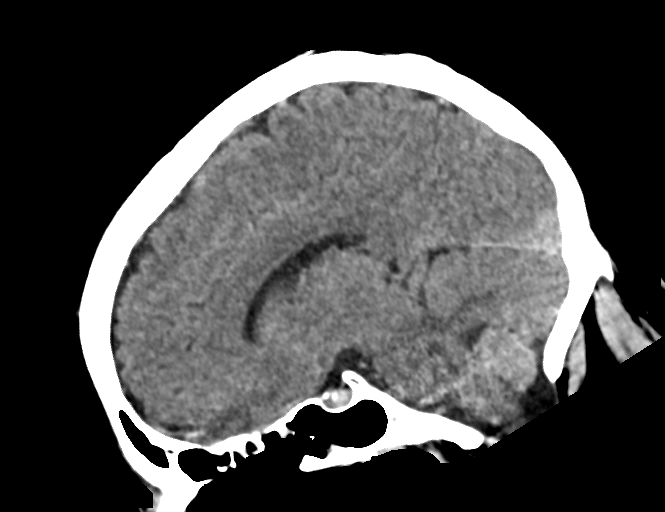
[im 38/57  brain]
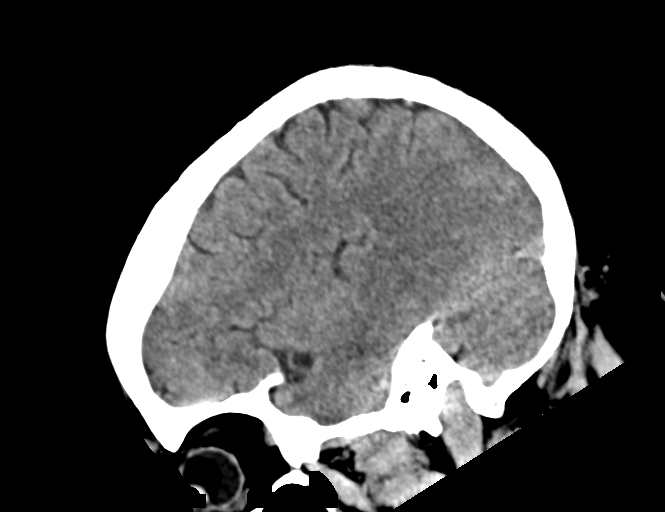

[13 of 47 positions shown; findings below may reference images not displayed]

FINDINGS: Brain: Normal ventricular morphology. No midline shift or mass
effect. Normal appearance of brain parenchyma. No intracranial
hemorrhage, mass lesion, or evidence of acute infarction. No
extra-axial fluid collections.

Vascular: Atherosclerotic calcification of internal carotid arteries
at skull base

Skull: Intact

Sinuses/Orbits: Clear

Other: N/A
IMPRESSION: No acute intracranial abnormalities.

## 2022-12-11 NOTE — Progress Notes (Signed)
Cardiology Office Note:  .   Date:  12/13/2022  ID:  Mario [name], DOB 1960/11/06, MRN [id number] PCP: Mick Sell, MD  [facility] Providers Cardiologist:  Julien Nordmann, MD Electrophysiologist:  Lanier Prude, MD    History of Present Illness: .   Mario [name] is a 62 y.o. male with past medical history of HTN, CAD s/p PTCA/stenting to the bifurcation of Lcx and OM1 (2 overlapping stents)  in 2004, s/p balloon angioplasty of in stent restenosis and DES to Prox Lcx in 2019, HLD, former tobacco use, possible TIA and statin intolerance.   Previously admitted in 04/2002 with worsening fatigue and nocturnal chest pain. He underwent LHC that showed normal LAD, Lcx was large and dominant with ~60% stenosis of the proximal Om1, RCA without obstructive disease, intially medically managed. However he returned to the hospital on 04/27/2002 with worsened chest pain, underwent repeat LHC with successful PCI/BMS to the Lcx. In 11/2002 he returned with worsening chest pain, underwent repeat LHC with successful PCI/stenting of the OM1 and distal Lcx.  In 06/2016 he reported an episode to Dr. Elease Hashimoto of some mental status changes such as slurring words, questioned possible TIA. Noted his blood pressure had been elevated to 179/126. He reported no further confusion following the single event.   On 08/19/2017 he presented to [facility] with chest pain for the prior month. He underwent LHC that showed ISR involving the proximal Lcx/OM1 and significnat disease in jailed segment of AV-groove Lcx, he underwent stent placement to AV-groove Lcx and PTCA of ISR.  He was last seen by Dr. Mariah Milling on 01/01/22 for follow up. He had remained stable from a cardiac perspective. He was referred to lipid clinic and PharmD for HTN management.   Last seen by PharmD for HTN on 09/13/22 his amlodipine/olmesartan had previously been stopped and he was started on irbesartan 300mg  daily in addition to chlorthalidone. He was  also started on amlodipine 2.5 mg daily, addition of spironolactone was considered but given decreased sodium level this was not started. Next recommendation was hydralazine. For HLD he had stopped Leqvio and was started on rosuvastatin 5mg  twice a week with ezetimibe.   Today he presents for follow up, reports he is doing well overall. He has no complaints aside from increased weight.  He denies chest pain, shortness of breath, lower extremity edema or palpitations.  Reports that he has had a very busy summer and has been unable to follow-up with Pharm.D. regarding hypertension or cholesterol.  Reports that he forgot to start taking amlodipine 2.5 mg daily following his last visit with Pharm.D.  We discussed his dietary habits at home, he notes that he eats 1 meal a day, generally cooks himself, denies eating any canned foods, reports that he eats only fresh or frozen fruits and vegetables.  He endorses drinking multiple diet Cokes throughout the day as well as drinking 5-6 beers a day.  ROS: Today he denies chest pain, shortness of breath, lower extremity edema, fatigue, palpitations, melena, hematuria, hemoptysis, diaphoresis, weakness, presyncope, syncope, orthopnea, and PND.   Studies Reviewed: [name]   EKG Interpretation Date/Time:  Friday December 13 2022 10:40:25 EDT Ventricular Rate:  85 PR Interval:  144 QRS Duration:  84 QT Interval:  342 QTC Calculation: 406 R Axis:   49  Text Interpretation: Normal sinus rhythm Nonspecific T wave abnormality T wave inversion now evident in Anterior leads Confirmed by Reather Littler [phone] on 12/13/2022 10:48:38 AM   Cardiac Studies &  Procedures   CARDIAC CATHETERIZATION  CARDIAC CATHETERIZATION 08/19/2017  Narrative Conclusions: 1. Significant single-vessel coronary artery disease involving dominant LCx and OM1, as detailed below. 2. Normal left ventricular contraction with mildly elevated left ventricular filling pressure. 3. Successful FFR-guided  bifurcation PCI to LCx and OM1 with balloon angioplasty of in-stent restenosis (2 layers of overlapping stent from 2004) and drug-eluting stent placement to the jailed AV groove LCx (T-stenting technique).  There is less than 10% residual stenosis in the LCx/OM1 and TIMI-3 flow.  Recommendations: 1. Aggressive secondary prevention including tobacco cessation and escalation of lipid therapy. 2. Indefinite dual antiplatelet therapy. 3. Outpatient cardiac rehab and close follow-up with Dr. Elease Hashimoto after discharge.  Yvonne Kendall, MD [facility] Pager: [phone]  Findings Coronary Findings Diagnostic  Dominance: Left  Left Main Vessel is large. Vessel is angiographically normal. Short LMCA.  Left Anterior Descending Vessel is large. Vessel is angiographically normal.  First Diagonal Branch Vessel is small in size.  Second Diagonal Branch Vessel is small in size.  Third Diagonal Branch Vessel is small in size.  Left Circumflex Vessel is large. Ost Cx to Prox Cx lesion is 60% stenosed. The lesion was previously treated using a drug eluting stent over 2 years ago. Previously placed stent displays restenosis. Pressure wire/FFR was performed on the lesion. FFR: 0.78. Prox Cx lesion is 70% stenosed. Pressure wire/FFR was performed on the lesion. FFR: 0.67. Dist Cx lesion is 60% stenosed with 40% stenosed side branch in Ost LPDA to LPDA.  First Obtuse Marginal Branch Vessel is large in size. Ost 1st Mrg to 1st Mrg lesion is 60% stenosed. The lesion was previously treatedover 2 years ago. Previously placed stent displays restenosis. Pressure wire/FFR was performed on the lesion. FFR: 0.78.  Second Obtuse Marginal Branch Vessel is small in size.  Left Posterior Descending Artery Vessel is moderate in size.  First Left Posterolateral Branch Vessel is small in size.  Second Left Posterolateral Branch Vessel is small in size.  Right Coronary Artery Vessel is small. There  is mild diffuse disease throughout the vessel.  Intervention  Ost Cx to Prox Cx lesion Angioplasty Balloon angioplasty was performed using a BALLOON [place] TREK RX 3.5X8. Maximum pressure: 18 atm. Post-Intervention Lesion Assessment The intervention was successful. Pre-interventional TIMI flow is 3. Post-intervention TIMI flow is 3. No complications occurred at this lesion. There is a 10% residual stenosis post intervention.  Prox Cx lesion Stent A drug-eluting stent was successfully placed using a STENT SIERRA 3.00 X 12 MM. Maximum pressure: 16 atm. Stent strut is well apposed. Post-Intervention Lesion Assessment The intervention was successful. Pre-interventional TIMI flow is 3. Post-intervention TIMI flow is 3. No complications occurred at this lesion. There is a 0% residual stenosis post intervention.  Ost 1st Mrg to 1st Mrg lesion Angioplasty Balloon angioplasty was performed using a BALLOON [place] TREK RX 3.5X8. Maximum pressure: 18 atm. Post-Intervention Lesion Assessment The intervention was successful. Pre-interventional TIMI flow is 3. Post-intervention TIMI flow is 3. No complications occurred at this lesion. There is a 10% residual stenosis post intervention.                Risk Assessment/Calculations:     HYPERTENSION CONTROL Vitals:   12/13/22 1033 12/13/22 1102  BP: (!) 158/92 (!) 148/84    The patient's blood pressure is elevated above target today.  In order to address the patient's elevated BP: A referral to the PharmD Hypertension Clinic will be placed.; Blood pressure will be monitored at  home to determine if medication changes need to be made.; A new medication was prescribed today.          Physical Exam:   VS:  BP (!) 148/84   Pulse 85   Ht 5\' 9"  (1.753 m)   Wt 246 lb 6.4 oz (111.8 kg)   SpO2 97%   BMI 36.39 kg/m    Wt Readings from Last 3 Encounters:  12/13/22 246 lb 6.4 oz (111.8 kg)  06/11/22 245 lb 9.6 oz (111.4 kg)  03/04/22 240 lb 3.2 oz (109  kg)    GEN: Well nourished, well developed in no acute distress NECK: No JVD; No carotid bruits CARDIAC: RRR, no murmurs, rubs, gallops RESPIRATORY:  Clear to auscultation without rales, wheezing or rhonchi  ABDOMEN: Soft, non-tender, non-distended EXTREMITIES:  No edema; No deformity   ASSESSMENT AND PLAN: .    CAD: s/p PTCA/stenting to the bifurcation of Lcx and OM1 (2 overlapping stents)  in 2004, s/p balloon angioplasty of in stent restenosis and DES to Prox Lcx in 2019. Stable with no anginal symptoms. No indication for ischemic evaluation.  Heart healthy diet and regular cardiovascular exercise encouraged.  Reviewed EKG changes consisting of T wave inversions in the anterior leads with patient, he denies any anginal symptoms.  Reviewed ED precautions. Continue aspirin, chlorthalidone, Plavix, Zetia, irbesartan, rosuvastatin and nitroglycerin as needed. Start amlodipine 2.5 mg daily. Check CBC, CMET and fasting lipid profile.   HTN: Currently followed by Pharm D for ongoing hypertension management. Was started on amlodipine/olmesartan by Dr. Mariah Milling in 12/2021, felt he had flushing with amlodipine. Had been following with PharmD for hypertension management, at last visit he was taking irbesartan and chlorthalidone, it was recommended that he start amlodipine 2.5 mg daily, patient reports that he forgot. Initial blood pressure today 158/92, on recheck was 148/84. Discussed possibly starting hydralazine but he does not feel he would remember to take multiple doses. Will restart on amlodipine 2.5 mg daily. Recommend he monitor blood pressure at home and will have him follow up with pharmD.   HLD: Previously followed by Lipid clinic. Intolerant to statins due ot myalgias, did not tolerate Repatha. No longer on Leqvio due to pricing and sub optimal results. Goal LDL less than 70. Last lipid profile on 07/17/22 indicated total cholesterol of 207 and LDL of 100. Reports he has been tolerating Zetia 10 mg  daily and Rosuvastatin 5 mg twice weekly well, will continue. Check fasting lipid profile and LFTs, will have him follow up with pharmD.   PAD/PVD: History of right superficial femoral artery occlusion, previously followed by [facility]. Denies claudication pain.   History of tobacco abuse: Stopped smoking in 2019.        Dispo: Follow up with Dr. Mariah Milling or Charlsie Quest, NP in 6 months.   Signed, Rip Harbour, NP

## 2022-12-13 ENCOUNTER — Ambulatory Visit: Payer: Managed Care, Other (non HMO)

## 2022-12-13 ENCOUNTER — Encounter: Payer: Self-pay | Admitting: Cardiology

## 2022-12-13 ENCOUNTER — Ambulatory Visit: Payer: Managed Care, Other (non HMO) | Attending: Cardiology | Admitting: Cardiology

## 2022-12-13 VITALS — BP 148/84 | HR 85 | Ht 69.0 in | Wt 246.4 lb

## 2022-12-13 DIAGNOSIS — G720 Drug-induced myopathy: Secondary | ICD-10-CM | POA: Diagnosis not present

## 2022-12-13 DIAGNOSIS — E782 Mixed hyperlipidemia: Secondary | ICD-10-CM

## 2022-12-13 DIAGNOSIS — I2510 Atherosclerotic heart disease of native coronary artery without angina pectoris: Secondary | ICD-10-CM | POA: Diagnosis not present

## 2022-12-13 DIAGNOSIS — I10 Essential (primary) hypertension: Secondary | ICD-10-CM | POA: Diagnosis not present

## 2022-12-13 DIAGNOSIS — T466X5A Adverse effect of antihyperlipidemic and antiarteriosclerotic drugs, initial encounter: Secondary | ICD-10-CM

## 2022-12-13 NOTE — Patient Instructions (Signed)
Medication Instructions:  Your physician recommends that you continue on your current medications as directed. Please refer to the Current Medication list given to you today.  *If you need a refill on your cardiac medications before your next appointment, please call your pharmacy*   Lab Work: Your provider would like for you to return in 1 week to have the following labs drawn: fasting lipid panel, CMP, CBC.   Please go to [facility] [address] ([facility], [place] [address] You do not need an appointment.  They are open from 7:30 am-4 pm.  Lunch from 1:00 pm- 2:00 pm You need to be fasting.   You may also go to any of these [facility] locations:  [facility]  - [address] [employer] [address] Chief Technology Officer)     If you have labs (blood work) drawn today and your tests are completely normal, you will receive your results only by: [employer] (if you have MyChart) OR A paper copy in the mail If you have any lab test that is abnormal or we need to change your treatment, we will call you to review the results.   Testing/Procedures: none   Follow-Up: At [facility], you and your health needs are our priority.  As part of our continuing mission to provide you with exceptional heart care, we have created designated Provider Care Teams.  These Care Teams include your primary Cardiologist (physician) and Advanced Practice Providers (APPs -  Physician Assistants and Nurse Practitioners) who all work together to provide you with the care you need, when you need it.  We recommend signing up for the patient portal called "MyChart".  Sign up information is provided on this After Visit Summary.  MyChart is used to connect with patients for Virtual Visits (Telemedicine).  Patients are able to view lab/test results, encounter notes, upcoming appointments, etc.  Non-urgent messages can be sent to your provider as well.   To learn more about  what you can do with MyChart, go to [email or web address].    Your next appointment:   6 month(s)  Provider:   Julien Nordmann, MD or Charlsie Quest, NP    Patient needs to follow up with PharmD

## 2022-12-21 LAB — CBC
Hematocrit: 48.1 % (ref 37.5–51.0)
Hemoglobin: 16.7 g/dL (ref 13.0–17.7)
MCH: 32.4 pg (ref 26.6–33.0)
MCHC: 34.7 g/dL (ref 31.5–35.7)
MCV: 93 fL (ref 79–97)
Platelets: 225 10*3/uL (ref 150–450)
RBC: 5.16 x10E6/uL (ref 4.14–5.80)
RDW: 12.9 % (ref 11.6–15.4)
WBC: 4.9 10*3/uL (ref 3.4–10.8)

## 2022-12-21 LAB — COMPREHENSIVE METABOLIC PANEL
ALT: 78 IU/L — ABNORMAL HIGH (ref 0–44)
AST: 42 IU/L — ABNORMAL HIGH (ref 0–40)
Albumin: 4.8 g/dL (ref 3.8–4.9)
Alkaline Phosphatase: 76 IU/L (ref 44–121)
BUN/Creatinine Ratio: 13 (ref 9–20)
BUN: 12 mg/dL (ref 6–24)
Bilirubin Total: 0.7 mg/dL (ref 0.0–1.2)
CO2: 22 mmol/L (ref 20–29)
Calcium: 9.7 mg/dL (ref 8.7–10.2)
Chloride: 99 mmol/L (ref 96–106)
Creatinine, Ser: 0.93 mg/dL (ref 0.76–1.27)
Globulin, Total: 2.1 g/dL (ref 1.5–4.5)
Glucose: 98 mg/dL (ref 70–99)
Potassium: 4.5 mmol/L (ref 3.5–5.2)
Sodium: 138 mmol/L (ref 134–144)
Total Protein: 6.9 g/dL (ref 6.0–8.5)
eGFR: 95 mL/min/{1.73_m2} (ref >59)

## 2022-12-21 LAB — LIPID PANEL
Chol/HDL Ratio: 2.4 ratio (ref 0.0–5.0)
Cholesterol, Total: 182 mg/dL (ref 100–199)
HDL: 77 mg/dL (ref >39)
LDL Chol Calc (NIH): 89 mg/dL (ref 0–99)
Triglycerides: 91 mg/dL (ref 0–149)
VLDL Cholesterol Cal: 16 mg/dL (ref 5–40)

## 2023-01-22 ENCOUNTER — Telehealth: Payer: Self-pay | Admitting: Pharmacy Technician

## 2023-01-22 NOTE — Telephone Encounter (Addendum)
Patient has Chiquita Loth, which requires patient uses Archivist.   case Radiographer, therapeutic Phone: [phone] ext [phone]   Auth Submission: APPROVED X 1 DOSE Site of care: Site of care: [facility] Payer: [employer] Medication & CPT/J Code(s) submitted: Leqvio (Inclisiran) [id number] Route of submission (phone, fax, portal):  Phone #[phone] Fax # Auth type: Buy/Bill PB Units/visits requested: x1 DOSE Reference number: [id number] Approval from:  01/16/23 - 02/20/23  For next renewal auth: Per Archie Patten case manager For service provider use [facility].

## 2023-01-27 ENCOUNTER — Telehealth: Payer: Self-pay

## 2023-01-27 ENCOUNTER — Encounter: Payer: Self-pay | Admitting: Pharmacist

## 2023-01-27 NOTE — Telephone Encounter (Signed)
Mario [name] from [facility] called and stated that the patient is adamant about not taking Leqvio any longer.

## 2023-01-27 NOTE — Telephone Encounter (Signed)
He had a very disappointing reduction on Leqvio. LDL-C is better on rosuvastatin 5mg  twice a week. Can we go ahead and d/c his orders?

## 2023-01-29 NOTE — Telephone Encounter (Signed)
Per Dr. Mariah Milling   We can cancel the order for [facility]

## 2023-02-19 ENCOUNTER — Ambulatory Visit: Payer: Managed Care, Other (non HMO) | Attending: Cardiology | Admitting: Pharmacist

## 2023-02-19 VITALS — BP 150/74 | HR 81

## 2023-02-19 DIAGNOSIS — I10 Essential (primary) hypertension: Secondary | ICD-10-CM

## 2023-02-19 DIAGNOSIS — E782 Mixed hyperlipidemia: Secondary | ICD-10-CM | POA: Diagnosis not present

## 2023-02-19 MED ORDER — AMLODIPINE BESYLATE 5 MG PO TABS: 5.0000 mg | ORAL_TABLET | Freq: Every day | ORAL | 3 refills | Status: DC

## 2023-02-19 MED ORDER — ROSUVASTATIN CALCIUM 5 MG PO TABS: 5.0000 mg | ORAL_TABLET | Freq: Every day | ORAL | 3 refills | Status: DC

## 2023-02-19 NOTE — Progress Notes (Unsigned)
Patient ID: Mario [name]                 DOB: January 21, 1961                    MRN: [id number]      HPI: Mario [name] is a 62 y.o. male patient referred to HTN clinic by Dr. Mariah Milling. PMH is significant for HTN, CAD s/p PTCA and stenting in 2004, stenting to the proximal left circumflex artery and obtuse marginal artery in 2019. Family hx of premature disease. Has taken statins previous and could hardly get out of bed. Has been on ezetimibe for several years. LDL-C 146. Was on Repatha, but hates sticking himself. Also would get large bruises. He was recently tried on Leqvio, but response was subpar.   When reviewing lipid labs, patient mentioned that his blood pressure medications had been changed in Sept. Ever since then, he his face gets warm and red after taking amlodipine. I asked him to decrease his amlodipine/omlesartan to 1/2 tablet per day and start checking blood pressure.   Saw patient on 4/12. Was still having issues with his face being warm and red. BP was up. He was drinking a lot of coke and beer. Had resumed rosuvastatin. Amlodipine/olmesartan was stopped and irbesartan 300mg  was started. He was asked to cut back on caffeine and alcohol.  At last visit with me 09/13/22. BP was elevated and home BP cuff was found to be inaccurate. He had cut back on diet coke, but wasn't willing to cut back on beer. His Na was on the lower end, so spironolactone was not added. Amlodipine 2.5mg  daily was added. He missed his June follow up. Seen by Reather Littler 8/23. Admitted at that appointment that he forgot to start amlodipine. He was asked to start taking. Na was improved on labs (138), K 4.5, scr 0.93. AST, ALT slightly high, LDL-C 89.   Patient presents today for follow-up.  He has not been checking his blood pressure at home.  He did get a new blood pressure cuff but then got rid of it.  States he does not feel like they are accurate and causes him more stressed to check it.  He has started taking  amlodipine 2.5 mg daily.  Denies any dizziness lightheadedness, headache, blurred vision and swelling.  He is drinking about 3-4 diet Cokes per day and about 4-5 Miller lites per evening.  He states that he used to not be able to fall asleep at night.  He would fall asleep and wake up at midnight and not be able to go back to sleep.  He started drinking beer in the evening and now he sleeps much better.  We discussed that alcohol can decrease the quality of sleep.  I asked that he might try drinking only 1-2 beers instead.  He does not do any structured exercise but he is busy building a building in his backyard and also does physical work at the landfill.  He is tolerating rosuvastatin twice per week well along with his ezetimibe.  He asks about Ozempic for weight loss.  He does not eat but 1 meal per day.  After he eats he feels very fatigued.  Current Lipid Medications: ezetimibe 10mg  daily, rosuvastatin 5mg  twice a week Current HTN medications: irbesartan 300mg  daily, chlorthalidone 25mg  daily, amlodipine 2.5mg  daily Previous HTN medications: olmesartan/amlodipine 40/10mg  daily (flushing, red/warm face), benazepril, losartan, irbesartan, valsartan, HCTZ Intolerances: Statin myalgias, rosuvastatin 5mg  daily, atorvastatin, simvastatin  Repatha: bruising/swelling at injection site, Leqvio- ineffective Risk Factors: premature CAD, progressive CAD LDL goal: <55  Diet: 3-4 16 oz bottles per day, very little water Steak, chicken, fish w/ salad w/ ranch Doesn't salt food  Exercise: Active at baseline limited by chronic right low back pain with occasional sciatica, active runs a landfill, stays busy  Family History:  Family History  Problem Relation Age of Onset   Hyperlipidemia Mother    Hypertension Mother    Heart disease Mother    Heart failure Mother        68   Lung cancer Mother    Leukemia Father    Hypertension Sister    Hypertension Sister    Coronary artery disease Other     Social History: quit smoking in 2018  Labs:  12/20/22 TC 182, TG 91, HDL 77, LDL-C 89 (rosuvastatin 5 mg twice per week, ezetimibe 10 mg daily) 01/01/22 TC 240, TG 68, HDL 80, LDL-C 146 (ezetimibe 10mg  daily)  Past Medical History:  Diagnosis Date   Arthritis    CAD (coronary artery disease)    HLD (hyperlipidemia)    Recurrent chest pain    Sleep apnea    cpap    Current Outpatient Medications on File Prior to Visit  Medication Sig Dispense Refill   chlorthalidone (HYGROTON) 25 MG tablet Take 1 tablet (25 mg total) by mouth daily. 90 tablet 0   clopidogrel (PLAVIX) 75 MG tablet Take 1 tablet (75 mg total) by mouth daily. Must keep follow up appointment for additional refills. 90 tablet 0   ezetimibe (ZETIA) 10 MG tablet Take 1 tablet (10 mg total) by mouth daily. Must keep follow up appointment for additional refills. 90 tablet 0   irbesartan (AVAPRO) 300 MG tablet Take 1 tablet (300 mg total) by mouth daily. 90 tablet 0   aspirin 81 MG tablet Take 81 mg daily by mouth.  (Patient not taking: Reported on 02/19/2023)     nitroGLYCERIN (NITROSTAT) 0.4 MG SL tablet PLACE 1 TABLET (0.4 MG TOTAL) UNDER TONGUE EVERY 5 MINUTES FOR 3 DOSES AS NEEDED FOR CHEST PAIN 75 tablet 0   No current facility-administered medications on file prior to visit.    Allergies  Allergen Reactions   Atorvastatin Calcium Other (See Comments)   Rosuvastatin Calcium Other (See Comments)   Simvastatin Other (See Comments)    Joint pain   Crestor [Rosuvastatin Calcium]     Joint pain.  He is able to take a low dose every other day.    Lipitor [Atorvastatin Calcium]     Joint pain    Assessment/Plan:  1. Hyperlipidemia -   HTN (hypertension) Assessment: Blood pressure is elevated in clinic today but is improved from previous visits He does not check his blood pressure at home, states it causes him more frustration He has been compliant with his medications Still drinking a boost and amount of  caffeine and alcohol We discussed the effects of caffeine and alcohol and blood pressure Patient is willing to switch to caffeine free Diet Coke and he is willing to consider decreasing beer to 2 per evening He is concerned about weight gain and asks about GLP-1.  We discussed how this medication works and considering he does not consume but 1 meal per day this may not necessarily be the best medication for him.  It is possible that his insurance would cover [facility] for CV indication.  However we decided to try having him eat 3 small meals per  day.  Decrease alcohol consumption  Plan: Increase amlodipine to 5 mg daily Decrease beer to 2/day Change to caffeine free Diet Coke I asked patient to follow-up in 3 to 4 weeks however patient requested to follow-up the first week in December  Hyperlipidemia Assessment: LDL-C is above goal of less than 55 It is improved to 89 on rosuvastatin 5 mg twice per week and ezetimibe daily He is tolerating this regimen well  Plan: Patient agreeable to increasing rosuvastatin to 5 mg daily Continue ezetimibe 10 mg daily Recheck in 2 to 3 months Will need to check LFT at next visit   Thank you,  Olene Floss, Pharm.D, BCACP, BCPS, CPP [facility] of [facility] [address], [place], [place] [address]  Phone: [phone]; Fax: [phone]

## 2023-02-19 NOTE — Patient Instructions (Addendum)
Increase rosuvastatin to daily Increase amlodipine to 5mg  daily Change diet coke to caffeine free Decrease beer to 2 per day  Your blood pressure goal is < 130/51mmHg   Important lifestyle changes to control high blood pressure  Intervention  Effect on the BP   Weight loss Weight loss is one of the most effective lifestyle changes for controlling blood pressure. If you're overweight or obese, losing even a small amount of weight can help reduce blood pressure.    Blood pressure can decrease by 1 millimeter of mercury (mmHg) with each kilogram (about 2.2 pounds) of weight lost.   Exercise regularly As a general goal, aim for 30 minutes of moderate physical activity every day.    Regular physical activity can lower blood pressure by 5 - 8 mmHg.   Eat a healthy diet Eat a diet rich in whole grains, fruits, vegetables, lean meat, and low-fat dairy products. Limit processed foods, saturated fat, and sweets.    A heart-healthy diet can lower high blood pressure by 10 mmHg.   Reduce salt (sodium) in your diet Aim for 000mg  of sodium each day. Avoid deli meats, canned food, and frozen microwave meals which are high in sodium.     Limiting sodium can reduce blood pressure by 5 mmHg.   Limit alcohol One drink equals 12 ounces of beer, 5 ounces of wine, or 1.5 ounces of 80-proof liquor.    Limiting alcohol to < 1 drink a day for women or < 2 drinks a day for men can help lower blood pressure by about 4 mmHg.   To check your pressure at home you will need to:   Sit up in a chair, with feet flat on the floor and back supported. Do not cross your ankles or legs. Rest your left arm so that the cuff is about heart level. If the cuff goes on your upper arm, then just relax your arm on the table, arm of the chair, or your lap. If you have a wrist cuff, hold your wrist against your chest at heart level. Place the cuff snugly around your arm, about 1 inch above the crease of your elbow. The  cords should be inside the groove of your elbow.  Sit quietly, with the cuff in place, for about 5 minutes. Then press the power button to start a reading. Do not talk or move while the reading is taking place.  Record your readings on a sheet of paper. Although most cuffs have a memory, it is often easier to see a pattern developing when the numbers are all in front of you.  You can repeat the reading after 1-3 minutes if it is recommended.   Make sure your bladder is empty and you have not had caffeine or tobacco within the last 30 minutes   Always bring your blood pressure log with you to your appointments. If you have not brought your monitor in to be double checked for accuracy, please bring it to your next appointment.   You can find a list of validated (accurate) blood pressure cuffs at: validatebp.org

## 2023-02-19 NOTE — Assessment & Plan Note (Signed)
Assessment: Blood pressure is elevated in clinic today but is improved from previous visits He does not check his blood pressure at home, states it causes him more frustration He has been compliant with his medications Still drinking a boost and amount of caffeine and alcohol We discussed the effects of caffeine and alcohol and blood pressure Patient is willing to switch to caffeine free Diet Coke and he is willing to consider decreasing beer to 2 per evening He is concerned about weight gain and asks about GLP-1.  We discussed how this medication works and considering he does not consume but 1 meal per day this may not necessarily be the best medication for him.  It is possible that his insurance would cover [facility] for CV indication.  However we decided to try having him eat 3 small meals per day.  Decrease alcohol consumption  Plan: Increase amlodipine to 5 mg daily Decrease beer to 2/day Change to caffeine free Diet Coke I asked patient to follow-up in 3 to 4 weeks however patient requested to follow-up the first week in December

## 2023-02-19 NOTE — Assessment & Plan Note (Addendum)
Assessment: LDL-C is above goal of less than 55 It is improved to 89 on rosuvastatin 5 mg twice per week and ezetimibe daily He is tolerating this regimen well  Plan: Patient agreeable to increasing rosuvastatin to 5 mg daily Continue ezetimibe 10 mg daily Recheck in 2 to 3 months Will need to check LFT at next visit

## 2023-03-24 ENCOUNTER — Other Ambulatory Visit: Payer: Self-pay | Admitting: Cardiovascular Disease

## 2023-03-25 ENCOUNTER — Ambulatory Visit: Payer: Managed Care, Other (non HMO)

## 2023-03-25 ENCOUNTER — Other Ambulatory Visit: Payer: Self-pay

## 2023-03-25 MED ORDER — IRBESARTAN 300 MG PO TABS: 300.0000 mg | ORAL_TABLET | Freq: Every day | ORAL | 1 refills | Status: AC

## 2023-03-25 MED ORDER — CHLORTHALIDONE 25 MG PO TABS: 25.0000 mg | ORAL_TABLET | Freq: Every day | ORAL | 1 refills | Status: DC

## 2023-03-25 MED ORDER — CLOPIDOGREL BISULFATE 75 MG PO TABS: 75.0000 mg | ORAL_TABLET | Freq: Every day | ORAL | 1 refills | Status: AC

## 2023-04-23 NOTE — Progress Notes (Signed)
 Patient ID: Mario [name]                 DOB: 09-12-1960                    MRN: [id number]      HPI: Mario [name] is a 63 y.o. male patient referred to HTN clinic by Dr. Gollan. PMH is significant for HTN, CAD s/p PTCA and stenting in 2004, stenting to the proximal left circumflex artery and obtuse marginal artery in 2019. Family hx of premature disease. Has taken statins previous and could hardly get out of bed. Has been on ezetimibe  for several years. LDL-C 146. Was on Repatha , but hates sticking himself. Also would get large bruises. He was recently tried on Leqvio , but response was subpar.   When reviewing lipid labs, patient mentioned that his blood pressure medications had been changed in Sept. Ever since then, he his face gets warm and red after taking amlodipine . I asked him to decrease his amlodipine /omlesartan to 1/2 tablet per day and start checking blood pressure.   Saw patient on 4/12. Was still having issues with his face being warm and red. BP was up. He was drinking a lot of coke and beer. Had resumed rosuvastatin . Amlodipine /olmesartan  was stopped and irbesartan  300mg  was started. He was asked to cut back on caffeine and alcohol.  At visit with me 09/13/22. BP was elevated and home BP cuff was found to be inaccurate. He had cut back on diet coke, but wasn't willing to cut back on beer. His Na was on the lower end, so spironolactone was not added. Amlodipine  2.5mg  daily was added. He missed his June follow up. Seen by Katlyn West 8/23. Admitted at that appointment that he forgot to start amlodipine . He was asked to start taking. Na was improved on labs (138), K 4.5, scr 0.93. AST, ALT slightly high, LDL-C 89.   At last appointment with me 02/19/23 BP was 150/74. He was not checking blood pressure at home because it caused him more stress. He agreed to try to decrease his beer intake to 2 per day. Was using it to help him sleep. We discussed that alcohol decreases sleep quality. He  also was agreeable to change to caffeine free diet coke. Amlodipine  was increased to 5mg  daily.  Patient presents today to clinic for follow-up.  He denies any dizziness, lightheadedness or swelling.  He is not checking his blood pressure at home.  Sleeping well.  He did try decreasing beer intake.  Sometimes drinks caffeine free Coke, other times regular diet Coke.  He is taking rosuvastatin  daily along with his ezetimibe .  Says he thinks it is starting to hurt his knees and elbow but is bearable.  Did recommend that he could decrease to 3-4 times a week.  Current Lipid Medications: ezetimibe  10mg  daily, rosuvastatin  5mg  daily Current HTN medications: irbesartan  300mg  daily, chlorthalidone  25mg  daily, amlodipine  5mg  daily Previous HTN medications: olmesartan /amlodipine  40/10mg  daily (flushing, red/warm face), benazepril , losartan , irbesartan , valsartan , HCTZ Intolerances: Statin myalgias, rosuvastatin  5mg  daily, atorvastatin, simvastatin Repatha : bruising/swelling at injection site, Leqvio - ineffective Risk Factors: premature CAD, progressive CAD LDL goal: <55  Diet: 3-4 16 oz bottles per day, very little water  Steak, chicken, fish w/ salad w/ ranch Doesn't salt food  Exercise: Active at baseline limited by chronic right low back pain with occasional sciatica, active runs a landfill, stays busy  Family History:  Family History  Problem Relation Age of Onset  Hyperlipidemia Mother    Hypertension Mother    Heart disease Mother    Heart failure Mother        60   Lung cancer Mother    Leukemia Father    Hypertension Sister    Hypertension Sister    Coronary artery disease Other    Social History: quit smoking in 2018  Labs:  12/20/22 TC 182, TG 91, HDL 77, LDL-C 89 (rosuvastatin  5 mg twice per week, ezetimibe  10 mg daily) 01/01/22 TC 240, TG 68, HDL 80, LDL-C 146 (ezetimibe  10mg  daily)  Past Medical History:  Diagnosis Date   Arthritis    CAD (coronary artery disease)    HLD  (hyperlipidemia)    Recurrent chest pain    Sleep apnea    cpap    Current Outpatient Medications on File Prior to Visit  Medication Sig Dispense Refill   amLODipine  (NORVASC ) 5 MG tablet Take 1 tablet (5 mg total) by mouth daily. 90 tablet 3   aspirin  81 MG tablet Take 81 mg daily by mouth.  (Patient not taking: Reported on 02/19/2023)     chlorthalidone  (HYGROTON ) 25 MG tablet Take 1 tablet (25 mg total) by mouth daily. 90 tablet 1   clopidogrel  (PLAVIX ) 75 MG tablet Take 1 tablet (75 mg total) by mouth daily. 90 tablet 1   ezetimibe  (ZETIA ) 10 MG tablet TAKE 1 TABLET DAILY (MUST KEEP FOLLOW UP APPOINTMENT FOR ADDITIONAL REFILLS) 90 tablet 3   irbesartan  (AVAPRO ) 300 MG tablet Take 1 tablet (300 mg total) by mouth daily. 90 tablet 1   nitroGLYCERIN  (NITROSTAT ) 0.4 MG SL tablet PLACE 1 TABLET (0.4 MG TOTAL) UNDER TONGUE EVERY 5 MINUTES FOR 3 DOSES AS NEEDED FOR CHEST PAIN 75 tablet 0   rosuvastatin  (CRESTOR ) 5 MG tablet Take 1 tablet (5 mg total) by mouth daily. 90 tablet 3   No current facility-administered medications on file prior to visit.    Allergies  Allergen Reactions   Atorvastatin Calcium  Other (See Comments)   Rosuvastatin  Calcium  Other (See Comments)   Simvastatin Other (See Comments)    Joint pain   Crestor  [Rosuvastatin  Calcium ]     Joint pain.  He is able to take a low dose every other day.    Lipitor [Atorvastatin Calcium ]     Joint pain    Assessment/Plan:  1. Hyperlipidemia -   HTN (hypertension) Assessment: Blood pressure elevated in clinic today No home readings Blood pressure higher than previous clinic readings Tolerating current medications  Plan: Patient agreeable to starting to check his blood pressure at home He will get an upper arm blood pressure cuff.  Proper technique reviewed with patient Patient to send me blood pressure readings in 2 weeks If home readings are elevated will either increase amlodipine  to 10 mg or add spironolactone In  person follow-up made for the end of February per patient request  Hyperlipidemia Assessment: Per patient now taking rosuvastatin  5 mg daily which he thinks may be affecting his knees and elbow I did advise that he could decrease the frequency of rosuvastatin  however patient thinks if he starts skipping doses or taking it only a few days a week that he will not remember and will not take it at all  Plan: Continue ezetimibe  10 mg daily and rosuvastatin  5 mg daily   Thank you,  Omero Kowal D Josephmichael Lisenbee, Pharm.D, BCACP, CPP [facility] of [facility] [address], [place], [place] [address]  Phone: [phone])  [phone]; Fax: [phone]

## 2023-04-24 ENCOUNTER — Ambulatory Visit: Payer: Managed Care, Other (non HMO) | Attending: Cardiovascular Disease | Admitting: Pharmacist

## 2023-04-24 VITALS — BP 164/80 | HR 83

## 2023-04-24 DIAGNOSIS — G720 Drug-induced myopathy: Secondary | ICD-10-CM | POA: Diagnosis not present

## 2023-04-24 DIAGNOSIS — T466X5D Adverse effect of antihyperlipidemic and antiarteriosclerotic drugs, subsequent encounter: Secondary | ICD-10-CM | POA: Diagnosis not present

## 2023-04-24 DIAGNOSIS — I10 Essential (primary) hypertension: Secondary | ICD-10-CM

## 2023-04-24 DIAGNOSIS — E782 Mixed hyperlipidemia: Secondary | ICD-10-CM

## 2023-04-24 NOTE — Assessment & Plan Note (Signed)
 Assessment: Blood pressure elevated in clinic today No home readings Blood pressure higher than previous clinic readings Tolerating current medications  Plan: Patient agreeable to starting to check his blood pressure at home He will get an upper arm blood pressure cuff.  Proper technique reviewed with patient Patient to send me blood pressure readings in 2 weeks If home readings are elevated will either increase amlodipine  to 10 mg or add spironolactone In person follow-up made for the end of February per patient request

## 2023-04-24 NOTE — Patient Instructions (Signed)
 Your blood pressure goal is < 130/38mmHg   Please get an upper arm blood pressure cuff and start checking blood pressure daily.  Please send me a list of blood pressure readings in 2 weeks  Important lifestyle changes to control high blood pressure  Intervention  Effect on the BP   Weight loss Weight loss is one of the most effective lifestyle changes for controlling blood pressure. If you're overweight or obese, losing even a small amount of weight can help reduce blood pressure.    Blood pressure can decrease by 1 millimeter of mercury (mmHg) with each kilogram (about 2.2 pounds) of weight lost.   Exercise regularly As a general goal, aim for 30 minutes of moderate physical activity every day.    Regular physical activity can lower blood pressure by 5 - 8 mmHg.   Eat a healthy diet Eat a diet rich in whole grains, fruits, vegetables, lean meat, and low-fat dairy products. Limit processed foods, saturated fat, and sweets.    A heart-healthy diet can lower high blood pressure by 10 mmHg.   Reduce salt (sodium) in your diet Aim for 000mg  of sodium each day. Avoid deli meats, canned food, and frozen microwave meals which are high in sodium.     Limiting sodium can reduce blood pressure by 5 mmHg.   Limit alcohol One drink equals 12 ounces of beer, 5 ounces of wine, or 1.5 ounces of 80-proof liquor.    Limiting alcohol to < 1 drink a day for women or < 2 drinks a day for men can help lower blood pressure by about 4 mmHg.   To check your pressure at home you will need to:   Sit up in a chair, with feet flat on the floor and back supported. Do not cross your ankles or legs. Rest your left arm so that the cuff is about heart level. If the cuff goes on your upper arm, then just relax your arm on the table, arm of the chair, or your lap. If you have a wrist cuff, hold your wrist against your chest at heart level. Place the cuff snugly around your arm, about 1 inch above the  crease of your elbow. The cords should be inside the groove of your elbow.  Sit quietly, with the cuff in place, for about 5 minutes. Then press the power button to start a reading. Do not talk or move while the reading is taking place.  Record your readings on a sheet of paper. Although most cuffs have a memory, it is often easier to see a pattern developing when the numbers are all in front of you.  You can repeat the reading after 1-3 minutes if it is recommended.   Make sure your bladder is empty and you have not had caffeine or tobacco within the last 30 minutes   Always bring your blood pressure log with you to your appointments. If you have not brought your monitor in to be double checked for accuracy, please bring it to your next appointment.   You can find a list of validated (accurate) blood pressure cuffs at: validatebp.org

## 2023-04-24 NOTE — Assessment & Plan Note (Signed)
 Assessment: Per patient now taking rosuvastatin  5 mg daily which he thinks may be affecting his knees and elbow I did advise that he could decrease the frequency of rosuvastatin  however patient thinks if he starts skipping doses or taking it only a few days a week that he will not remember and will not take it at all  Plan: Continue ezetimibe  10 mg daily and rosuvastatin  5 mg daily

## 2023-05-20 ENCOUNTER — Other Ambulatory Visit: Payer: Self-pay | Admitting: *Deleted

## 2023-05-20 ENCOUNTER — Encounter: Payer: Self-pay | Admitting: Pharmacist

## 2023-05-20 MED ORDER — AMLODIPINE BESYLATE 5 MG PO TABS: 5.0000 mg | ORAL_TABLET | Freq: Every day | ORAL | 0 refills | Status: DC

## 2023-06-12 ENCOUNTER — Ambulatory Visit: Payer: Managed Care, Other (non HMO)

## 2023-07-24 NOTE — Progress Notes (Signed)
 Patient ID: Mario [name]                 DOB: [birth year]                    MRN: [id number]      HPI: Mario [name] is a 63 y.o. male patient referred to HTN/lipid clinic by Dr. Mariah Milling. PMH is significant for HTN, CAD s/p PTCA and stenting in 2004, stenting to the proximal left circumflex artery and obtuse marginal artery in 2019. Family hx of premature disease. Has taken statins previously and could hardly get out of bed. Has been on ezetimibe for several years. LDL-C 89 (12/20/22), due for repeat. Was on Repatha, but hates sticking himself. Also would get large bruises. He was recently tried on Leqvio, but response was subpar.   At last pharmacy visit (04/24/23), patient denies any dizziness, lightheadedness or swelling. He is not checking his blood pressure at home. Sleeping well. Frustrated he isn't eating a lot but continues to gain weight. He did try decreasing beer intake, and switches off between caffeine Diet Coke and caffeine-free Diet Coke. He was experiencing increasing knee/elbow pain on rosuvastatin 5 mg and ezetimibe, recommended to decrease frequency of rosuvastatin to 3-4x weekly, but patient was concerned he would forget to take completely, so regimen was ultimately kept the same. BP 164/80 at visit. Discussed BP reading technique and patient instructed to bring readings into this visit with plan to increase amlodipine to 10 mg or add spironolactone if not at goal.   At today's visit, patient has purchased an upper arm BP cuff which has not been validated yet. He previously brought a wrist cuff that was inaccurate. No BP cuff brought in today. Patient reports taking his blood pressure right when he wakes up, before taking his medications. BP at home is consistently in the 140s systolic. He does not report symptoms of low blood pressure (dizziness, lightheadedness). He has cut down on beer intake significantly, and has reduced the amount of soda he drinks from 3-4 16 oz bottles to 2-3  daily. This soda intake is mostly diet and mostly caffeine free. He does not drink much water due to the well water in his hometown being high in iron/other metals.   He continues to report joint pain in his knees and elbows which has since worsened since the last pharmacy visit. He does not take any OTC analgesics to help with this pain. This pain has been present since starting rosuvastatin. He is still not confident that he can remember taking rosuvastatin 3-4x a week and prefers once daily dosing.   Current Lipid Medications: ezetimibe 10mg  daily, rosuvastatin 5mg  daily Current HTN medications: irbesartan 300mg  daily, chlorthalidone 25mg  daily, amlodipine 5mg  daily Previous HTN medications: olmesartan/amlodipine 40/10mg  daily (flushing, red/warm face), benazepril, losartan, irbesartan, valsartan, HCTZ Intolerances: Statin myalgias, rosuvastatin 5mg  daily, atorvastatin, simvastatin Repatha: bruising/swelling at injection site, Leqvio- ineffective Risk Factors: premature CAD, progressive CAD LDL goal: <55 mg/dl  Diet: 2-3 16 oz bottles of soda per day, very little water Steak, chicken, fish w/ salad w/ ranch, vegetables Doesn't salt food  Exercise: Active at baseline limited by chronic right low back pain with occasional sciatica, active runs a landfill, stays busy  Home readings: 141/82  Family History:  Family History  Problem Relation Age of Onset   Hyperlipidemia Mother    Hypertension Mother    Heart disease Mother    Heart failure Mother  58   Lung cancer Mother    Leukemia Father    Hypertension Sister    Hypertension Sister    Coronary artery disease Other    Social History: quit smoking in 2018  Labs:  12/20/22 TC 182, TG 91, HDL 77, LDL-C 89 (rosuvastatin 5 mg twice per week, ezetimibe 10 mg daily) 01/01/22 TC 240, TG 68, HDL 80, LDL-C 146 (ezetimibe 10mg  daily)  Past Medical History:  Diagnosis Date   Arthritis    CAD (coronary artery disease)    HLD  (hyperlipidemia)    Recurrent chest pain    Sleep apnea    cpap    Current Outpatient Medications on File Prior to Visit  Medication Sig Dispense Refill   ezetimibe (ZETIA) 10 MG tablet TAKE 1 TABLET DAILY (MUST KEEP FOLLOW UP APPOINTMENT FOR ADDITIONAL REFILLS) 90 tablet 3   amLODipine (NORVASC) 5 MG tablet Take 1 tablet (5 mg total) by mouth daily. 90 tablet 0   aspirin 81 MG tablet Take 81 mg daily by mouth.  (Patient not taking: Reported on 02/19/2023)     chlorthalidone (HYGROTON) 25 MG tablet Take 1 tablet (25 mg total) by mouth daily. 90 tablet 1   clopidogrel (PLAVIX) 75 MG tablet Take 1 tablet (75 mg total) by mouth daily. 90 tablet 1   irbesartan (AVAPRO) 300 MG tablet Take 1 tablet (300 mg total) by mouth daily. 90 tablet 1   nitroGLYCERIN (NITROSTAT) 0.4 MG SL tablet PLACE 1 TABLET (0.4 MG TOTAL) UNDER TONGUE EVERY 5 MINUTES FOR 3 DOSES AS NEEDED FOR CHEST PAIN 75 tablet 0   No current facility-administered medications on file prior to visit.    Allergies  Allergen Reactions   Atorvastatin Calcium Other (See Comments)   Rosuvastatin Calcium Other (See Comments)   Simvastatin Other (See Comments)    Joint pain   Crestor [Rosuvastatin Calcium]     Joint pain.  He is able to take a low dose every other day.    Lipitor [Atorvastatin Calcium]     Joint pain    Assessment/Plan:  HTN (hypertension) Assessment:  Home BP readings consistently in 140s systolic, takes BP right when waking up before AM meds Vitals today: BP 144/70, 146/72 - not at goal of < 130/80 mmHg but improved from previous visits; HR 95 Patient does not have access to clean tap/well water at home and drinks mostly sodas (caffeine free, diet) Patient is compliant to BP regimen and is not interested in making any med changes or adding other medications at this time  Plan: Continue regimen of irbesartan 300mg  daily, chlorthalidone 25mg  daily, amlodipine 5mg  daily. In place of medication titration, patient  agreed to increasing daily activity level (i.e., going for a short walk before or after work) and continuing to cut back on alcohol and soda.  Recommended patient increase water intake with plastic water bottles from the grocery store. Patient agreeable to titrating medication (increasing amlodipine to 10mg ) at next visit if BP still not at goal with lifestyle changes.  Patient will bring in BP cuff at next visit and recorded readings.  Hyperlipidemia Assessment: LDL-C 89 not at goal < 55 mg/dl (2/95/62 - due for repeat)  Patient reports joint pain (knees, elbows) since starting rosuvastatin, which has worsened since last visit. He does not take any OTC analgesics to relieve joint pain.  Patient is not willing to take rosuvastatin 3x weekly due to concern he will forget the regimen altogether.   Plan:  Recommended patient split  rosuvastatin 5 mg into half tablets for a daily dose of 2.5 mg. Recommended patient purchase a pill splitter at drugstore as tablets are not scored.  Repeat lipid panel at next visit in June after ~3 months of reduced dose.    Thank you,  Lendon Ka, PharmD Candidate 2025 APPE [facility] Extern 07/28/23  Olene Floss, Pharm.D, BCACP, CPP [facility] of [facility] [address], [place], [place] [address]  Phone: [phone]; Fax: [phone]

## 2023-07-28 ENCOUNTER — Ambulatory Visit: Payer: Managed Care, Other (non HMO) | Attending: Cardiovascular Disease | Admitting: Pharmacist

## 2023-07-28 ENCOUNTER — Ambulatory Visit: Payer: Managed Care, Other (non HMO) | Admitting: Pharmacist Clinician (PhC)/ Clinical Pharmacy Specialist

## 2023-07-28 VITALS — BP 146/72 | HR 95

## 2023-07-28 DIAGNOSIS — I10 Essential (primary) hypertension: Secondary | ICD-10-CM

## 2023-07-28 DIAGNOSIS — E782 Mixed hyperlipidemia: Secondary | ICD-10-CM | POA: Diagnosis not present

## 2023-07-28 MED ORDER — ROSUVASTATIN CALCIUM 5 MG PO TABS: 2.5000 mg | ORAL_TABLET | Freq: Every day | ORAL | 3 refills | Status: AC

## 2023-07-28 NOTE — Assessment & Plan Note (Addendum)
 Assessment:  Home BP readings consistently in 140s systolic, takes BP right when waking up before AM meds Vitals today: BP 144/70, 146/72 - not at goal of < 130/80 mmHg but improved from previous visits; HR 95 Patient does not have access to clean tap/well water at home and drinks mostly sodas (caffeine free, diet) Patient is compliant to BP regimen and is not interested in making any med changes or adding other medications at this time  Plan: Continue regimen of irbesartan 300mg  daily, chlorthalidone 25mg  daily, amlodipine 5mg  daily. In place of medication titration, patient agreed to increasing daily activity level (i.e., going for a short walk before or after work) and continuing to cut back on alcohol and soda.  Recommended patient increase water intake with plastic water bottles from the grocery store. Patient agreeable to titrating medication (increasing amlodipine to 10mg ) at next visit if BP still not at goal with lifestyle changes.  Patient will bring in BP cuff at next visit and recorded readings.

## 2023-07-28 NOTE — Assessment & Plan Note (Addendum)
 Assessment: LDL-C 89 not at goal < 55 mg/dl (06/28/63 - due for repeat)  Patient reports joint pain (knees, elbows) since starting rosuvastatin, which has worsened since last visit. He does not take any OTC analgesics to relieve joint pain.  Patient is not willing to take rosuvastatin 3x weekly due to concern he will forget the regimen altogether.   Plan:  Recommended patient split rosuvastatin 5 mg into half tablets for a daily dose of 2.5 mg. Recommended patient purchase a pill splitter at drugstore as tablets are not scored.  Repeat lipid panel at next visit in June after ~3 months of reduced dose.

## 2023-07-28 NOTE — Patient Instructions (Signed)
 Hold rosuvastatin for a few days and then resume at 2.5mg  (1/2 tablet daily)  Continue to cut back on caffeine and alcohol Try to add in a daily walk  Continue checking blood pressure at home. Please bring those readings and machine to next visit  Your blood pressure goal is < 130/77mmHg   Important lifestyle changes to control high blood pressure  Intervention  Effect on the BP   Weight loss Weight loss is one of the most effective lifestyle changes for controlling blood pressure. If you're overweight or obese, losing even a small amount of weight can help reduce blood pressure.    Blood pressure can decrease by 1 millimeter of mercury (mmHg) with each kilogram (about 2.2 pounds) of weight lost.   Exercise regularly As a general goal, aim for 30 minutes of moderate physical activity every day.    Regular physical activity can lower blood pressure by 5 - 8 mmHg.   Eat a healthy diet Eat a diet rich in whole grains, fruits, vegetables, lean meat, and low-fat dairy products. Limit processed foods, saturated fat, and sweets.    A heart-healthy diet can lower high blood pressure by 10 mmHg.   Reduce salt (sodium) in your diet Aim for 000mg  of sodium each day. Avoid deli meats, canned food, and frozen microwave meals which are high in sodium.     Limiting sodium can reduce blood pressure by 5 mmHg.   Limit alcohol One drink equals 12 ounces of beer, 5 ounces of wine, or 1.5 ounces of 80-proof liquor.    Limiting alcohol to < 1 drink a day for women or < 2 drinks a day for men can help lower blood pressure by about 4 mmHg.   To check your pressure at home you will need to:   Sit up in a chair, with feet flat on the floor and back supported. Do not cross your ankles or legs. Rest your left arm so that the cuff is about heart level. If the cuff goes on your upper arm, then just relax your arm on the table, arm of the chair, or your lap. If you have a wrist cuff, hold your  wrist against your chest at heart level. Place the cuff snugly around your arm, about 1 inch above the crease of your elbow. The cords should be inside the groove of your elbow.  Sit quietly, with the cuff in place, for about 5 minutes. Then press the power button to start a reading. Do not talk or move while the reading is taking place.  Record your readings on a sheet of paper. Although most cuffs have a memory, it is often easier to see a pattern developing when the numbers are all in front of you.  You can repeat the reading after 1-3 minutes if it is recommended.   Make sure your bladder is empty and you have not had caffeine or tobacco within the last 30 minutes   Always bring your blood pressure log with you to your appointments. If you have not brought your monitor in to be double checked for accuracy, please bring it to your next appointment.   You can find a list of validated (accurate) blood pressure cuffs at: validatebp.org

## 2023-08-13 ENCOUNTER — Encounter: Payer: Self-pay | Admitting: Nurse Practitioner

## 2023-08-13 ENCOUNTER — Ambulatory Visit: Attending: Nurse Practitioner | Admitting: Nurse Practitioner

## 2023-08-13 VITALS — BP 170/80 | HR 101 | Ht 69.0 in | Wt 246.6 lb

## 2023-08-13 DIAGNOSIS — I10 Essential (primary) hypertension: Secondary | ICD-10-CM | POA: Diagnosis not present

## 2023-08-13 DIAGNOSIS — Z79899 Other long term (current) drug therapy: Secondary | ICD-10-CM

## 2023-08-13 DIAGNOSIS — E782 Mixed hyperlipidemia: Secondary | ICD-10-CM | POA: Diagnosis not present

## 2023-08-13 DIAGNOSIS — I2510 Atherosclerotic heart disease of native coronary artery without angina pectoris: Secondary | ICD-10-CM

## 2023-08-13 DIAGNOSIS — F1010 Alcohol abuse, uncomplicated: Secondary | ICD-10-CM

## 2023-08-13 MED ORDER — AMLODIPINE BESYLATE 10 MG PO TABS: 10.0000 mg | ORAL_TABLET | Freq: Every day | ORAL | 3 refills | Status: AC

## 2023-08-13 MED ORDER — AMLODIPINE BESYLATE 5 MG PO TABS: 5.0000 mg | ORAL_TABLET | Freq: Every day | ORAL | 3 refills | Status: DC

## 2023-08-13 NOTE — Progress Notes (Signed)
 Office Visit    Patient Name: Mario [name] Date of Encounter: 08/13/2023  Primary Care Provider:  Eartha Gold, MD Primary Cardiologist:  Mario Boyden, MD  Chief Complaint    63 y.o. male with a history of CAD status post prior circumflex and first obtuse marginal stenting, hypertension, hyperlipidemia, prior tobacco abuse, possible TIA, OSA, and statin intolerance, who presents for CAD and hypertension follow-up.  Past Medical History  Subjective   Past Medical History:  Diagnosis Date   Arthritis    CAD (coronary artery disease)    a. 04/2002 BMS ->LCX; b. 11/2002 PCI dLCX and OM1; c. 07/2017 PCI: LM short - nl, LAD nl, LCX 60ost/p ISR -FFR 0.78, 70p (3.0x12 Sierra DES), 60d, OM 1 60 ISR - FFR 0.78 (PTCA), RCA small - mild diff dzs. EF 65%.   HLD (hyperlipidemia)    Recurrent chest pain    Sleep apnea    cpap   Statin intolerance    a. tolerates crestor  2.5 mg; b. Prev on repatha  - d/c due to not liking injections.   TIA (transient ischemic attack)    Past Surgical History:  Procedure Laterality Date   COLONOSCOPY WITH PROPOFOL  N/A 11/17/2015   Procedure: COLONOSCOPY WITH PROPOFOL ;  Surgeon: Mario Sink, MD;  Location: [facility];  Service: Endoscopy;  Laterality: N/A;  CPAP   CORONARY PRESSURE/FFR STUDY N/A 08/19/2017   Procedure: INTRAVASCULAR PRESSURE WIRE/FFR STUDY;  Surgeon: Mario Crisp, MD;  Location: [facility];  Service: Cardiovascular;  Laterality: N/A;   CORONARY STENT INTERVENTION N/A 08/19/2017   Procedure: CORONARY STENT INTERVENTION;  Surgeon: Mario Crisp, MD;  Location: [facility];  Service: Cardiovascular;  Laterality: N/A;   LEFT HEART CATH AND CORONARY ANGIOGRAPHY N/A 08/19/2017   Procedure: LEFT HEART CATH AND CORONARY ANGIOGRAPHY;  Surgeon: Mario Crisp, MD;  Location: [facility];  Service: Cardiovascular;  Laterality: N/A;   NASAL SINUS SURGERY     POLYPECTOMY N/A 11/17/2015   Procedure:  POLYPECTOMY;  Surgeon: Mario Sink, MD;  Location: [facility];  Service: Endoscopy;  Laterality: N/A;  sigmoid polyp   PTCA     3 stents    Allergies  Allergies  Allergen Reactions   Atorvastatin Calcium  Other (See Comments)   Rosuvastatin  Calcium  Other (See Comments)   Simvastatin Other (See Comments)    Joint pain   Crestor  [Rosuvastatin  Calcium ]     Joint pain.  He is able to take a low dose every other day.    Lipitor [Atorvastatin Calcium ]     Joint pain      History of Present Illness      63 y.o. y/o male with a history of CAD, hypertension, hyperlipidemia, prior tobacco abuse, TIA, sleep apnea, and statin intolerance.  He previously underwent bare-metal stenting of the left circumflex in January 2024 followed by PCI and stenting of the first obtuse marginal and distal left circumflex in August 2004.  In March 2018, he was seen in clinic following an episode of altered mental status, slurring of words, and hypertension.  It was felt this likely represented a TIA.  He had no further episodes.  In April 2019, he was admitted with angina.  Diagnostic catheterization revealed in-stent restenosis in the left circumflex and first obtuse marginal.  The circumflex was treated with a drug-eluting stent while the obtuse marginal was treated with PTCA.    [name] has been followed closely by our pharmacy team in the setting  of ongoing hypertension.  He was last seen April 7, at which time pressure was elevated at 146/72 with recommendation for increased exercise with plan to titrate amlodipine  if pressures remain elevated despite lifestyle changes.  Rosuvastatin  was also reduced to 2.5 mg due to myalgias.  Since reducing rosuvastatin , myalgias have resolved.  This is giving him the ability to walk more frequently, though he has not really begin a regular walking regimen.  His blood pressures continue to run in the 140s on a wrist cuff at home.  He continues to drink 4-5 [employer] a  day.  Also 3 cans of Diet Coke a day.  When discussing his diet, he notes that he eats quite a bit of steak as well as beef and chicken.  He denies chest pain, dyspnea, palpitations, PND, orthopnea, dizziness, syncope, edema, or early satiety. Objective  Home Medications    Current Outpatient Medications  Medication Sig Dispense Refill   aspirin  81 MG tablet Take 81 mg by mouth daily.     chlorthalidone  (HYGROTON ) 25 MG tablet Take 1 tablet (25 mg total) by mouth daily. 90 tablet 1   clopidogrel  (PLAVIX ) 75 MG tablet Take 1 tablet (75 mg total) by mouth daily. 90 tablet 1   ezetimibe  (ZETIA ) 10 MG tablet TAKE 1 TABLET DAILY (MUST KEEP FOLLOW UP APPOINTMENT FOR ADDITIONAL REFILLS) 90 tablet 3   irbesartan  (AVAPRO ) 300 MG tablet Take 1 tablet (300 mg total) by mouth daily. 90 tablet 1   nitroGLYCERIN  (NITROSTAT ) 0.4 MG SL tablet PLACE 1 TABLET (0.4 MG TOTAL) UNDER TONGUE EVERY 5 MINUTES FOR 3 DOSES AS NEEDED FOR CHEST PAIN 75 tablet 0   rosuvastatin  (CRESTOR ) 5 MG tablet Take 0.5 tablets (2.5 mg total) by mouth daily. 45 tablet 3   amLODipine  (NORVASC ) 10 MG tablet Take 1 tablet (10 mg total) by mouth daily. 90 tablet 3   No current facility-administered medications for this visit.     Physical Exam    VS:  BP (!) 140/90 (BP Location: Left Arm, Patient Position: Sitting, Cuff Size: Normal)   Pulse (!) 101   Ht 5\' 9"  (1.753 m)   Wt 246 lb 9.6 oz (111.9 kg)   SpO2 97%   BMI 36.42 kg/m  , BMI Body mass index is 36.42 kg/m.     Vitals:   08/13/23 0818 08/13/23 0911  BP: (!) 140/90 (!) 170/80  Pulse: (!) 101   SpO2: 97%       GEN: Well nourished, well developed, in no acute distress. HEENT: normal. Neck: Supple, no JVD, carotid bruits, or masses. Cardiac: RRR, no murmurs, rubs, or gallops. No clubbing, cyanosis, edema.  Radials 2+/PT 2+ and equal bilaterally.  Respiratory:  Respirations regular and unlabored, clear to auscultation bilaterally. GI: Soft, nontender, nondistended, BS +  x 4. MS: no deformity or atrophy. Skin: warm and dry, no rash. Neuro:  Strength and sensation are intact. Psych: Normal affect.  Accessory Clinical Findings    ECG personally reviewed by me today - EKG Interpretation Date/Time:  Wednesday August 13 2023 08:22:04 EDT Ventricular Rate:  101 PR Interval:  138 QRS Duration:  80 QT Interval:  340 QTC Calculation: 440 R Axis:   63  Text Interpretation: Sinus tachycardia Confirmed by Laneta Pintos [phone]) on 08/13/2023 8:40:27 AM  - no acute changes.  Lab Results  Component Value Date   WBC 4.9 12/20/2022   HGB 16.7 12/20/2022   HCT 48.1 12/20/2022   MCV 93 12/20/2022   PLT  225 12/20/2022   Lab Results  Component Value Date   CREATININE 0.93 12/20/2022   BUN 12 12/20/2022   NA 138 12/20/2022   K 4.5 12/20/2022   CL 99 12/20/2022   CO2 22 12/20/2022   Lab Results  Component Value Date   ALT 78 (H) 12/20/2022   AST 42 (H) 12/20/2022   GGT 74 (H) 02/26/2017   ALKPHOS 76 12/20/2022   BILITOT 0.7 12/20/2022   Lab Results  Component Value Date   CHOL 182 12/20/2022   HDL 77 12/20/2022   LDLCALC 89 12/20/2022   TRIG 91 12/20/2022   CHOLHDL 2.4 12/20/2022     Lab Results  Component Value Date   TSH 0.848 09/13/2022       Assessment & Plan    1.  Coronary artery disease: Status post prior PCI in 2004 with redo drug-eluting stent placement to the ostial/proximal left circumflex in 2019 as well as PTCA of the OM1 in the setting of in-stent restenosis.  He has since done well without chest pain or dyspnea.  He is not routinely exercising and we discussed the importance of finding 30 minutes every day to go for a walk or some other form of moderate activity.  Though he tries to avoid processed foods, he does eat a lot of red meat.  We discussed the importance of limiting red meat and meat in general, and increasing fruits, grains, and vegetables in his diet.  Increase amlodipine  to 10 mg daily in the setting of ongoing  hypertension.  He otherwise remains on aspirin , Plavix , Zetia , and low-dose rosuvastatin  in the setting of prior intolerance.  2.  Primary hypertension: Blood pressure remains elevated.  He was agreeable to increase his amlodipine  to 10 mg daily.  He otherwise remains on chlorthalidone  and irbesartan .  We discussed the potential addition of carvedilol, which would address his persistently elevated heart rates and hypertension, though he wished to avoid adding a fourth medicine today.  He has follow-up with Andi Kaufmann, RPH on 6/9.  I discussed his case with Ms. Maccia today and he understands that if he remains elevated at follow-up, we would like to add carvedilol.  3.  Hyperlipidemia: Complicated by statin intolerance.  Currently tolerating rosuvastatin  2.5 mg.  He is also on Zetia .  He is due for follow-up labs with our pharmacy team in [place] at next appointment.  4.  Alcohol abuse: Currently drinking 4-5 [employer] lights per day.  We discussed that this qualifies his binge drinking.  He was somewhat incredulous.  Strongly encouraged that he reduce his alcohol intake and would greatly benefit from complete cessation.  5.  Morbid obesity: He says he has trouble losing weight.  We discussed his diet and activities and pointed out opportunities for improvement.  Strongly encouraged more vegetarian based diet as well as at least 30 minutes of moderate exercise daily.  6.  Disposition: He has follow-up for hypertension with our pharmacy team in [place] in June.  He prefers to follow-up here in 1 year.  Laneta Pintos, NP 08/13/2023, 9:09 AM

## 2023-08-13 NOTE — Patient Instructions (Signed)
 Medication Instructions:  INCREASE the Amlodipine  to 10 mg once daily  *If you need a refill on your cardiac medications before your next appointment, please call your pharmacy*  Lab Work: None ordered If you have labs (blood work) drawn today and your tests are completely normal, you will receive your results only by: MyChart Message (if you have MyChart) OR A paper copy in the mail If you have any lab test that is abnormal or we need to change your treatment, we will call you to review the results.  Testing/Procedures: None ordered  Follow-Up: At [facility], you and your health needs are our priority.  As part of our continuing mission to provide you with exceptional heart care, our providers are all part of one team.  This team includes your primary Cardiologist (physician) and Advanced Practice Providers or APPs (Physician Assistants and Nurse Practitioners) who all work together to provide you with the care you need, when you need it.  Your next appointment:   12 month(s)  Provider:   You may see Timothy Gollan, MD or one of the following Advanced Practice Providers on your designated Care Team:   Laneta Pintos, NP Gildardo Labrador, PA-C Varney Gentleman, PA-C Cadence Elkader, PA-C Ronald Cockayne, NP Morey Ar, NP    We recommend signing up for the patient portal called "MyChart".  Sign up information is provided on this After Visit Summary.  MyChart is used to connect with patients for Virtual Visits (Telemedicine).  Patients are able to view lab/test results, encounter notes, upcoming appointments, etc.  Non-urgent messages can be sent to your provider as well.   To learn more about what you can do with MyChart, go to [email or web address].

## 2023-08-20 ENCOUNTER — Telehealth: Payer: Self-pay | Admitting: Internal Medicine

## 2023-08-20 NOTE — Telephone Encounter (Signed)
 Returned call to [place] from [employer] and verified that the amlodipine  dose is 10 mg daily, as documented in Odessa Bene, NP's office note dated 08/13/23."

## 2023-08-20 NOTE — Telephone Encounter (Signed)
 Pt c/o medication issue:  1. Name of Medication:   amLODipine  (NORVASC ) 10 MG tablet    2. How are you currently taking this medication (dosage and times per day)? As written   3. Are you having a reaction (difficulty breathing--STAT)? No   4. What is your medication issue? Mario [name] with [facility] called to clarify if pt should be taking 5mg  or 10mg . She states 5mg  was sent then 10mg  was sent 30 minutes later. Please advise.

## 2023-09-29 ENCOUNTER — Ambulatory Visit: Admitting: Pharmacist

## 2023-11-14 ENCOUNTER — Ambulatory Visit: Attending: Internal Medicine | Admitting: Pharmacist

## 2023-11-14 VITALS — BP 182/92 | HR 74

## 2023-11-14 DIAGNOSIS — I10 Essential (primary) hypertension: Secondary | ICD-10-CM

## 2023-11-14 NOTE — Patient Instructions (Addendum)
 Your blood pressure goal is < 130/72mmHg   Resume irbesartan  300mg  daily, chlorthalidone  25mg  daily, amlodipine  10mg  daily Continue checking blood pressure at home. Please write down the readings and bring them with you to your next appointment.  Important lifestyle changes to control high blood pressure  Intervention  Effect on the BP   Weight loss Weight loss is one of the most effective lifestyle changes for controlling blood pressure. If you're overweight or obese, losing even a small amount of weight can help reduce blood pressure.    Blood pressure can decrease by 1 millimeter of mercury (mmHg) with each kilogram (about 2.2 pounds) of weight lost.   Exercise regularly As a general goal, aim for 30 minutes of moderate physical activity every day.    Regular physical activity can lower blood pressure by 5 - 8 mmHg.   Eat a healthy diet Eat a diet rich in whole grains, fruits, vegetables, lean meat, and low-fat dairy products. Limit processed foods, saturated fat, and sweets.    A heart-healthy diet can lower high blood pressure by 10 mmHg.   Reduce salt (sodium) in your diet Aim for 000mg  of sodium each day. Avoid deli meats, canned food, and frozen microwave meals which are high in sodium.     Limiting sodium can reduce blood pressure by 5 mmHg.   Limit alcohol One drink equals 12 ounces of beer, 5 ounces of wine, or 1.5 ounces of 80-proof liquor.    Limiting alcohol to < 1 drink a day for women or < 2 drinks a day for men can help lower blood pressure by about 4 mmHg.   To check your pressure at home you will need to:   Sit up in a chair, with feet flat on the floor and back supported. Do not cross your ankles or legs. Rest your left arm so that the cuff is about heart level. If the cuff goes on your upper arm, then just relax your arm on the table, arm of the chair, or your lap. If you have a wrist cuff, hold your wrist against your chest at heart level. Place the  cuff snugly around your arm, about 1 inch above the crease of your elbow. The cords should be inside the groove of your elbow.  Sit quietly, with the cuff in place, for about 5 minutes. Then press the power button to start a reading. Do not talk or move while the reading is taking place.  Record your readings on a sheet of paper. Although most cuffs have a memory, it is often easier to see a pattern developing when the numbers are all in front of you.  You can repeat the reading after 1-3 minutes if it is recommended.   Make sure your bladder is empty and you have not had caffeine or tobacco within the last 30 minutes   Always bring your blood pressure log with you to your appointments. If you have not brought your monitor in to be double checked for accuracy, please bring it to your next appointment.   You can find a list of validated (accurate) blood pressure cuffs at: validatebp.org

## 2023-11-14 NOTE — Assessment & Plan Note (Signed)
 Assessment: Blood pressure extremely elevated in clinic today Patient has not taken his medications all week except for today He reports home readings being 130's-140/62-65 Still drinking about 4 beers per day.  No interest in cutting back  Plan: Resume blood pressure medications.   Continue checking blood pressure at home, write down the blood pressure readings and bring them into next visit Follow-up in September

## 2023-11-14 NOTE — Progress Notes (Signed)
 Patient ID: Mario [name]                 DOB: 05-14-1960                    MRN: [id number]      HPI: DELTON STELLE is a 63 y.o. male patient referred to HTN/lipid clinic by Dr. Gollan. PMH is significant for HTN, CAD s/p PTCA and stenting in 2004, stenting to the proximal left circumflex artery and obtuse marginal artery in 2019. Family hx of premature disease. Has taken statins previously and could hardly get out of bed. Has been on ezetimibe  for several years. LDL-C 89 (12/20/22), due for repeat. Was on Repatha , but hates sticking himself. Also would get large bruises. He was recently tried on Leqvio , but response was subpar.   At pharmacy visit (04/24/23), patient denies any dizziness, lightheadedness or swelling. He is not checking his blood pressure at home. Sleeping well. Frustrated he isn't eating a lot but continues to gain weight. He did try decreasing beer intake, and switches off between caffeine Diet Coke and caffeine-free Diet Coke. He was experiencing increasing knee/elbow pain on rosuvastatin  5 mg and ezetimibe , recommended to decrease frequency of rosuvastatin  to 3-4x weekly, but patient was concerned he would forget to take completely, so regimen was ultimately kept the same. BP 164/80 at visit. Discussed BP reading technique and patient instructed to bring readings into this visit with plan to increase amlodipine  to 10 mg or add spironolactone if not at goal.   At last visit with me 08/13/23, BP was still in 140's systolic. He refused any medication changes. He saw Medford Meager 4/23 and was agreeable to increasing amlodipine  to 10mg  daily. He reported still drinking 4-5 miller lite per day. Rosuvastatin  was decreased to 2.5mg  daily.   Patient presents today for follow-up.  Admits that he went out of town last week and forgot his medications.  He took them this morning for the first time.  He reports that his blood pressures have been 130's-140/62-65 at home.  He did decrease his  rosuvastatin  to 2.5 mg daily and has felt better.  Current Lipid Medications: ezetimibe  10mg  daily, rosuvastatin  2.5mg  daily Current HTN medications: irbesartan  300mg  daily, chlorthalidone  25mg  daily, amlodipine  10mg  daily Previous HTN medications: olmesartan /amlodipine  40/10mg  daily (flushing, red/warm face), benazepril , losartan , irbesartan , valsartan , HCTZ Intolerances: Statin myalgias, rosuvastatin  5mg  daily, atorvastatin, simvastatin Repatha : bruising/swelling at injection site, Leqvio - ineffective Risk Factors: premature CAD, progressive CAD LDL goal: <55 mg/dl  Diet: 2-3 16 oz bottles of soda per day, very little water  Steak, chicken, fish w/ salad w/ ranch, vegetables Doesn't salt food  Exercise: Active at baseline limited by chronic right low back pain with occasional sciatica, active runs a landfill, stays busy  Home readings: 130's-140/62-65  Family History:  Family History  Problem Relation Age of Onset   Hyperlipidemia Mother    Hypertension Mother    Heart disease Mother    Heart failure Mother        75   Lung cancer Mother    Leukemia Father    Hypertension Sister    Hypertension Sister    Coronary artery disease Other    Social History: quit smoking in 2018  Labs:  12/20/22 TC 182, TG 91, HDL 77, LDL-C 89 (rosuvastatin  5 mg twice per week, ezetimibe  10 mg daily) 01/01/22 TC 240, TG 68, HDL 80, LDL-C 146 (ezetimibe  10mg  daily)  Past Medical History:  Diagnosis Date  Arthritis    CAD (coronary artery disease)    a. 04/2002 BMS ->LCX; b. 11/2002 PCI dLCX and OM1; c. 07/2017 PCI: LM short - nl, LAD nl, LCX 60ost/p ISR -FFR 0.78, 70p (3.0x12 Sierra DES), 60d, OM 1 60 ISR - FFR 0.78 (PTCA), RCA small - mild diff dzs. EF 65%.   HLD (hyperlipidemia)    Recurrent chest pain    Sleep apnea    cpap   Statin intolerance    a. tolerates crestor  2.5 mg; b. Prev on repatha  - d/c due to not liking injections.   TIA (transient ischemic attack)     Current Outpatient  Medications on File Prior to Visit  Medication Sig Dispense Refill   amLODipine  (NORVASC ) 10 MG tablet Take 1 tablet (10 mg total) by mouth daily. 90 tablet 3   aspirin  81 MG tablet Take 81 mg by mouth daily.     chlorthalidone  (HYGROTON ) 25 MG tablet Take 1 tablet (25 mg total) by mouth daily. 90 tablet 1   clopidogrel  (PLAVIX ) 75 MG tablet Take 1 tablet (75 mg total) by mouth daily. 90 tablet 1   ezetimibe  (ZETIA ) 10 MG tablet TAKE 1 TABLET DAILY (MUST KEEP FOLLOW UP APPOINTMENT FOR ADDITIONAL REFILLS) 90 tablet 3   irbesartan  (AVAPRO ) 300 MG tablet Take 1 tablet (300 mg total) by mouth daily. 90 tablet 1   nitroGLYCERIN  (NITROSTAT ) 0.4 MG SL tablet PLACE 1 TABLET (0.4 MG TOTAL) UNDER TONGUE EVERY 5 MINUTES FOR 3 DOSES AS NEEDED FOR CHEST PAIN 75 tablet 0   rosuvastatin  (CRESTOR ) 5 MG tablet Take 0.5 tablets (2.5 mg total) by mouth daily. 45 tablet 3   No current facility-administered medications on file prior to visit.    Allergies  Allergen Reactions   Atorvastatin Calcium  Other (See Comments)   Rosuvastatin  Calcium  Other (See Comments)   Simvastatin Other (See Comments)    Joint pain   Crestor  [Rosuvastatin  Calcium ]     Joint pain.  He is able to take a low dose every other day.    Lipitor [Atorvastatin Calcium ]     Joint pain    Assessment/Plan:  HTN (hypertension) Assessment: Blood pressure extremely elevated in clinic today Patient has not taken his medications all week except for today He reports home readings being 130's-140/62-65 Still drinking about 4 beers per day.  No interest in cutting back  Plan: Resume blood pressure medications.   Continue checking blood pressure at home, write down the blood pressure readings and bring them into next visit Follow-up in September    Thank you,  Detron Carras D Dawnita Molner, Pharm.JONETTA SARAN, CPP [facility] A Division of [facility] [address] [place], [place] [address]  Phone: [phone]; Fax:  [phone]

## 2024-01-09 ENCOUNTER — Ambulatory Visit: Admitting: Pharmacist

## 2024-02-16 ENCOUNTER — Ambulatory Visit: Admitting: Pharmacist

## 2024-03-08 ENCOUNTER — Other Ambulatory Visit: Payer: Self-pay | Admitting: Cardiovascular Disease
